# Patient Record
Sex: Female | Born: 1945 | Race: White | Hispanic: No | Marital: Single | Smoking: Former smoker
Health system: Southern US, Community
[De-identification: ages and names within clinical notes are randomized; demographics above are authoritative.]

## PROBLEM LIST (undated history)

## (undated) DIAGNOSIS — Z87442 Personal history of urinary calculi: Secondary | ICD-10-CM

## (undated) DIAGNOSIS — I10 Essential (primary) hypertension: Secondary | ICD-10-CM

## (undated) DIAGNOSIS — M1990 Unspecified osteoarthritis, unspecified site: Secondary | ICD-10-CM

## (undated) DIAGNOSIS — E785 Hyperlipidemia, unspecified: Secondary | ICD-10-CM

## (undated) DIAGNOSIS — E039 Hypothyroidism, unspecified: Secondary | ICD-10-CM

## (undated) HISTORY — DX: Hyperlipidemia, unspecified: E78.5

## (undated) HISTORY — DX: Hypothyroidism, unspecified: E03.9

## (undated) HISTORY — DX: Essential (primary) hypertension: I10

## (undated) HISTORY — PX: OTHER SURGICAL HISTORY: SHX169

## (undated) HISTORY — PX: WISDOM TOOTH EXTRACTION: SHX21

---

## 1999-07-23 ENCOUNTER — Ambulatory Visit (HOSPITAL_COMMUNITY): Admission: RE | Admit: 1999-07-23 | Discharge: 1999-07-23 | Payer: Self-pay | Admitting: Family Medicine

## 1999-07-23 ENCOUNTER — Encounter: Payer: Self-pay | Admitting: Family Medicine

## 2001-08-22 ENCOUNTER — Other Ambulatory Visit: Admission: RE | Admit: 2001-08-22 | Discharge: 2001-08-22 | Payer: Self-pay | Admitting: Family Medicine

## 2002-09-10 ENCOUNTER — Other Ambulatory Visit: Admission: RE | Admit: 2002-09-10 | Discharge: 2002-09-10 | Payer: Self-pay | Admitting: Family Medicine

## 2003-08-25 ENCOUNTER — Ambulatory Visit (HOSPITAL_COMMUNITY): Admission: RE | Admit: 2003-08-25 | Discharge: 2003-08-25 | Payer: Self-pay | Admitting: Family Medicine

## 2003-08-25 ENCOUNTER — Encounter: Payer: Self-pay | Admitting: Family Medicine

## 2003-09-15 ENCOUNTER — Other Ambulatory Visit: Admission: RE | Admit: 2003-09-15 | Discharge: 2003-09-15 | Payer: Self-pay | Admitting: Family Medicine

## 2003-09-29 ENCOUNTER — Ambulatory Visit (HOSPITAL_COMMUNITY): Admission: RE | Admit: 2003-09-29 | Discharge: 2003-09-29 | Payer: Self-pay | Admitting: Family Medicine

## 2005-01-20 ENCOUNTER — Other Ambulatory Visit: Admission: RE | Admit: 2005-01-20 | Discharge: 2005-01-20 | Payer: Self-pay | Admitting: Family Medicine

## 2005-11-28 ENCOUNTER — Encounter: Admission: RE | Admit: 2005-11-28 | Discharge: 2005-11-28 | Payer: Self-pay | Admitting: Family Medicine

## 2005-11-28 IMAGING — US US SOFT TISSUE HEAD/NECK
1 series · 14 of 25 positions shown · non-contrast
Comparison: none

CLINICAL DATA: Hypothyroidism, dysphagia.
 THYROID ULTRASOUND:
TECHNIQUE: Ultrasound examination of the thyroid gland and adjacent soft tissue structures was performed.
 The right lobe of the thyroid measures 4.7 x 1.8 x 1.8 cm in size and the left lobe 4.4 x 1.4 x 1.5 cm in size.  The thyroid echotexture is somewhat inhomogeneous.  There is a 7 x 3 x 4 mm in size small solid nodule within the anterior superior portion of the right isthmus.  The remainder of the study is normal.

[Series 1: unknown · 0.09mm/px · 14 of 29 slices shown]
[im 1/29]
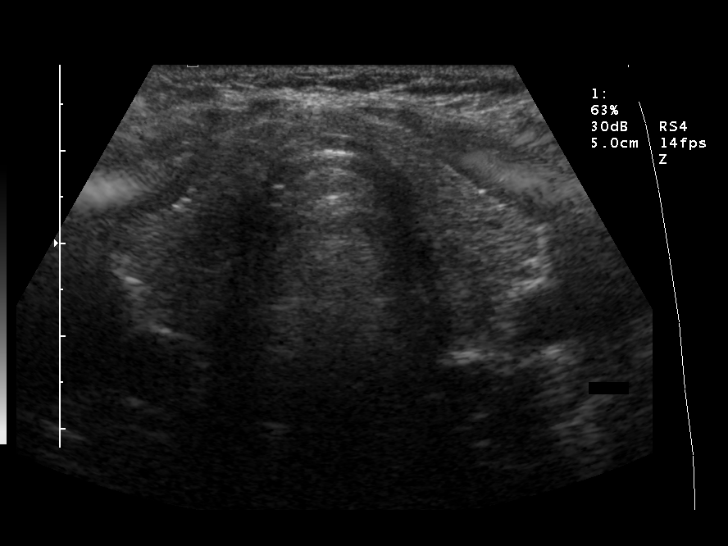
[im 3/29]
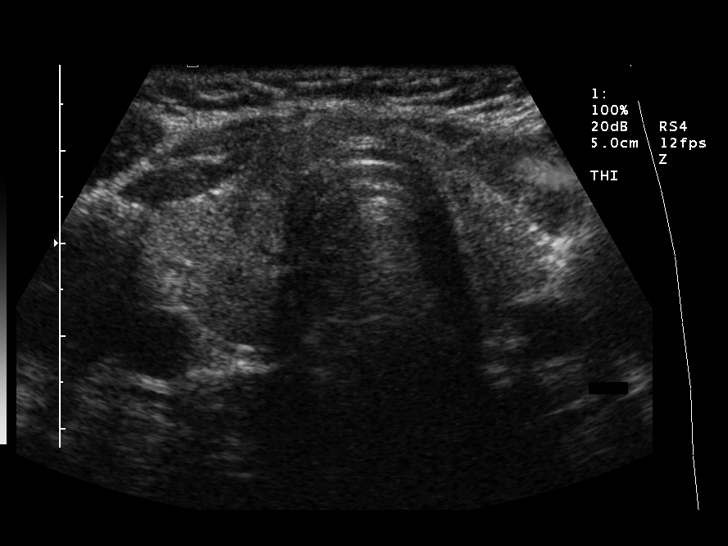
[im 5/29]
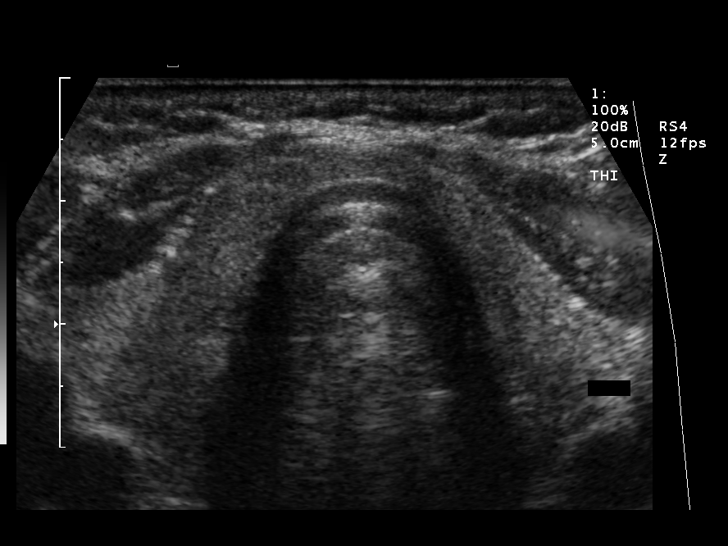
[im 8/29]
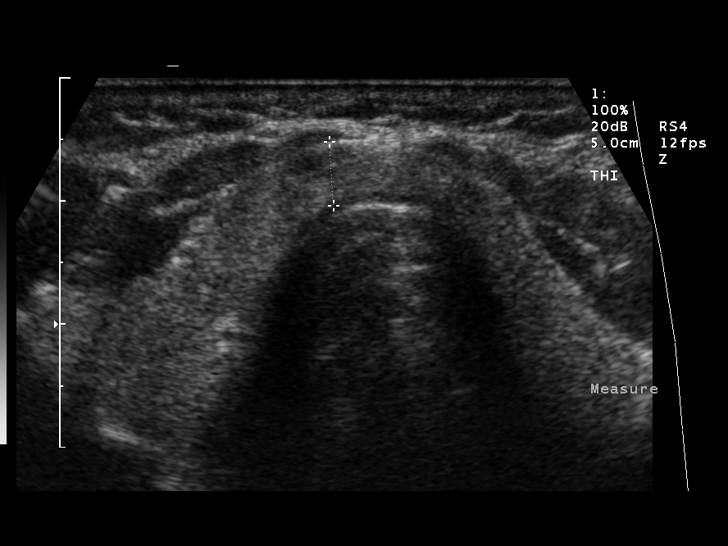
[im 10/29]
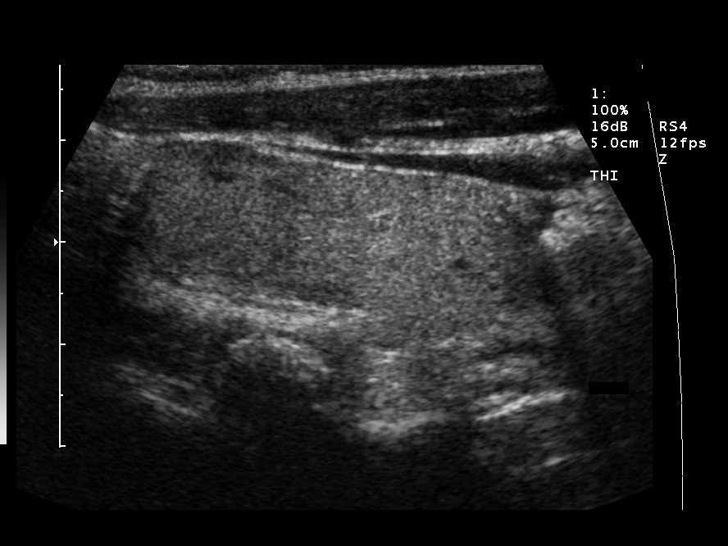
[im 11/29]
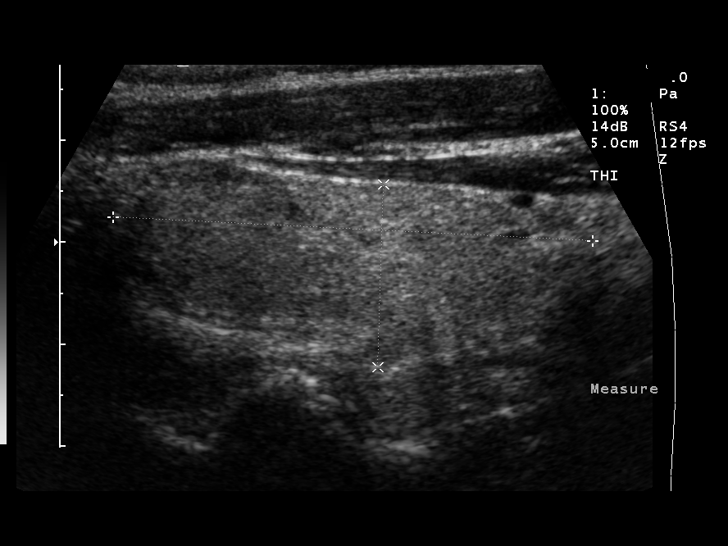
[im 13/29]
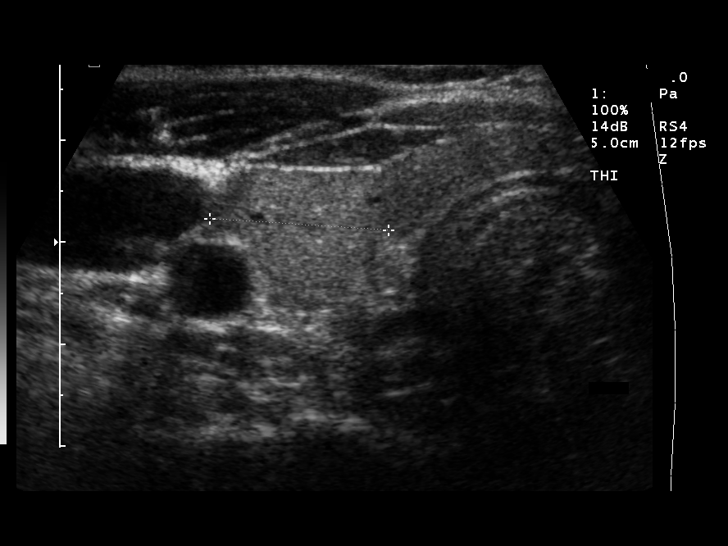
[im 16/29]
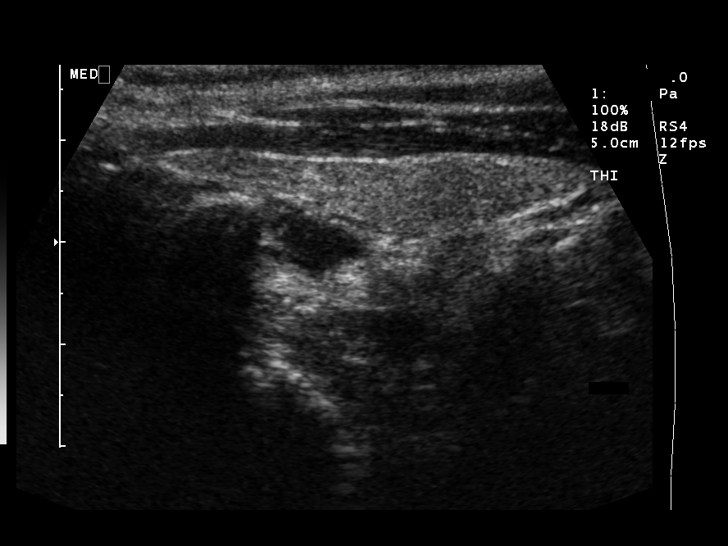
[im 18/29]
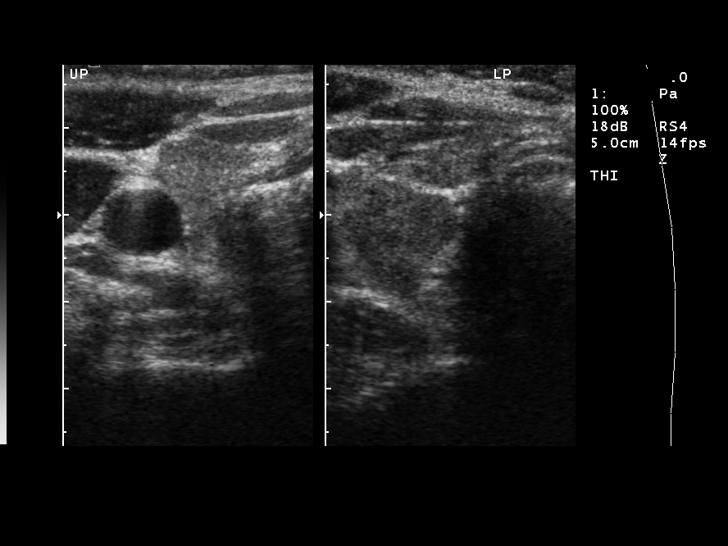
[im 19/29]
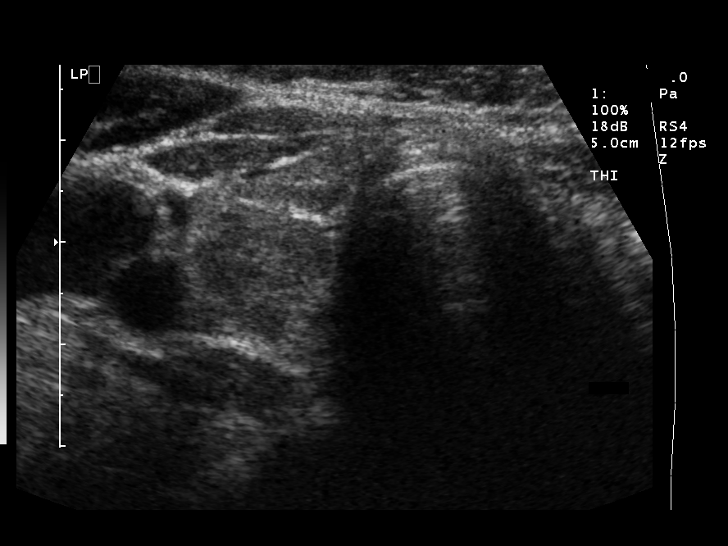
[im 22/29]
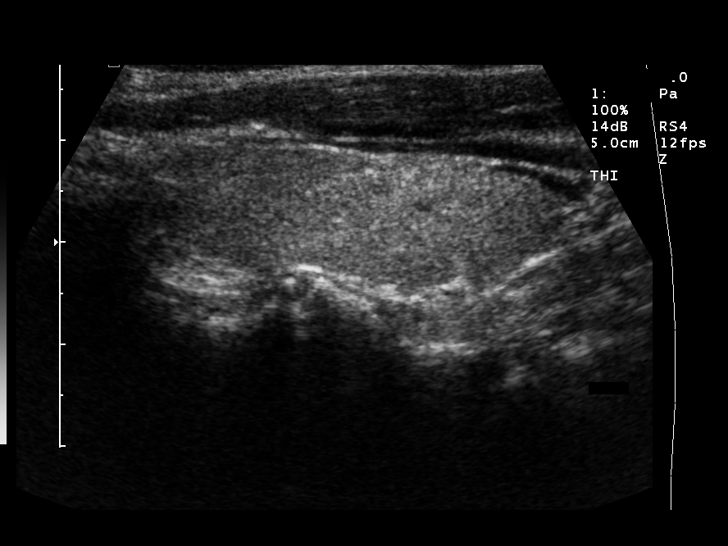
[im 24/29]
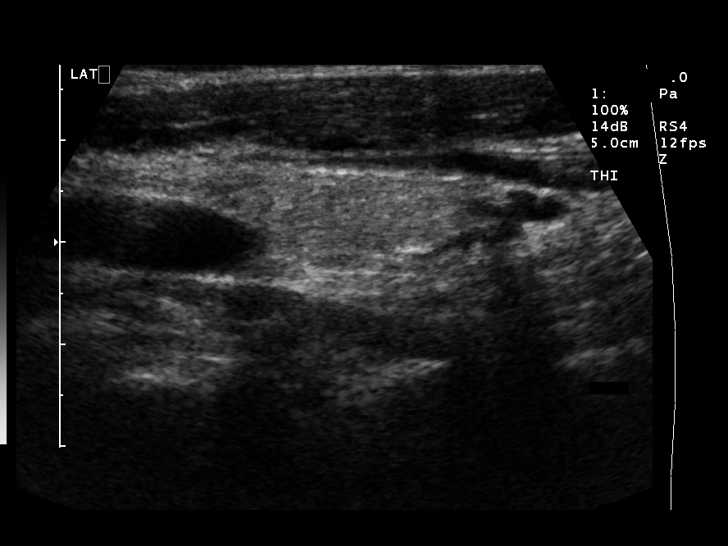
[im 26/29]
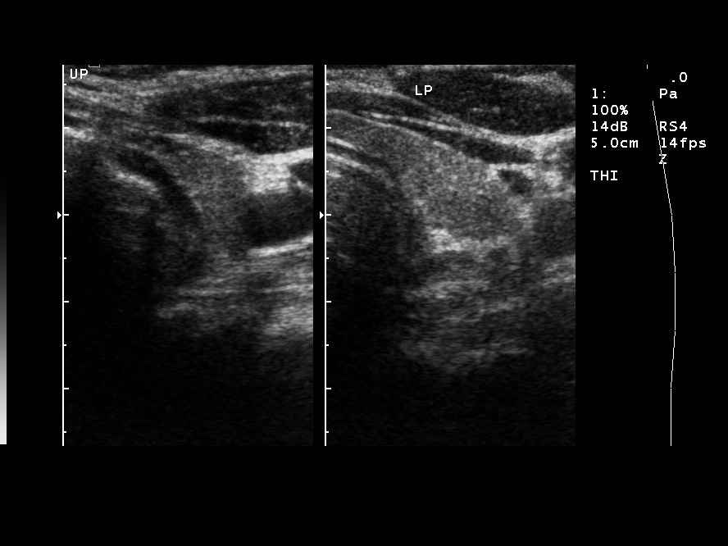
[im 29/29]
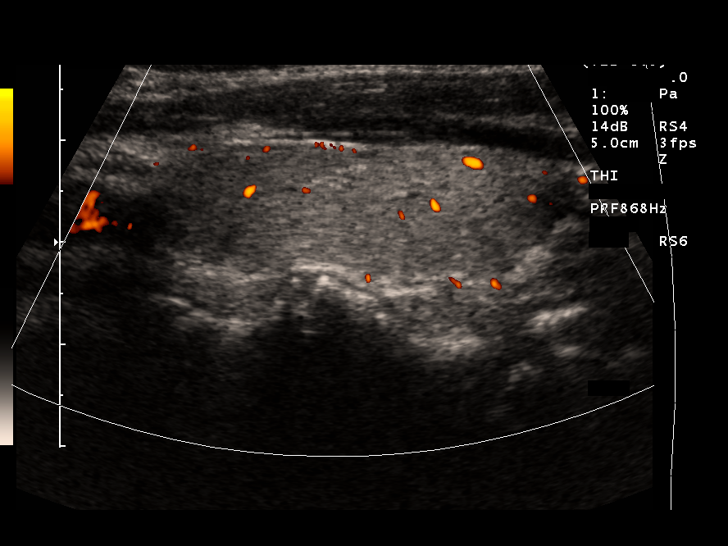

[14 of 25 positions shown; findings below may reference images not displayed]

IMPRESSION: Small solid nodule located within the anterior superior isthmus to the right of midline measuring 7 x 3 x 4 mm in size.

## 2006-10-27 ENCOUNTER — Encounter: Admission: RE | Admit: 2006-10-27 | Discharge: 2006-10-27 | Payer: Self-pay | Admitting: Family Medicine

## 2006-10-27 IMAGING — US US ABDOMEN COMPLETE
1 series · 14 of 25 positions shown · non-contrast
Comparison: [REDACTED] nuclear medicine hepatobiliary with gallbladder ejection fraction study [DATE].

CLINICAL DATA: Elevated liver function tests.  Abdominal bloating.  
 COMPLETE ABDOMINAL ULTRASOUND:
TECHNIQUE: Complete abdominal ultrasound examination was performed including evaluation of the liver, gallbladder, bile ducts, pancreas, kidneys, spleen, IVC, and abdominal aorta.

[Series 1: unknown · 14 of 70 slices shown]
[im 1/70]
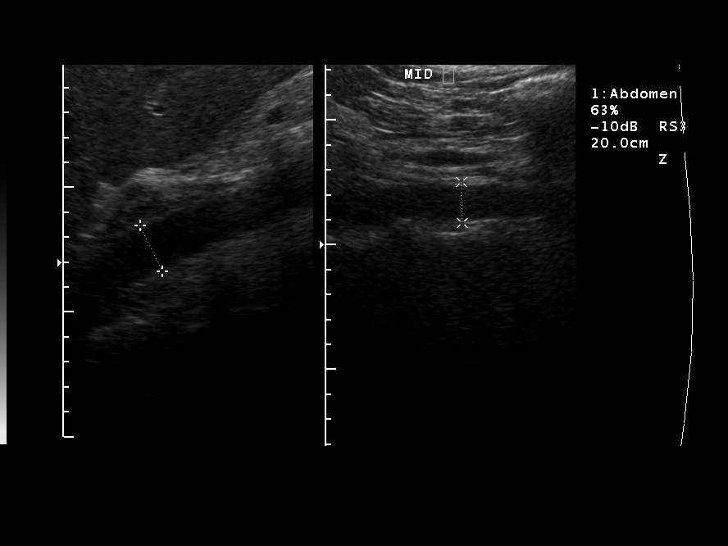
[im 6/70]
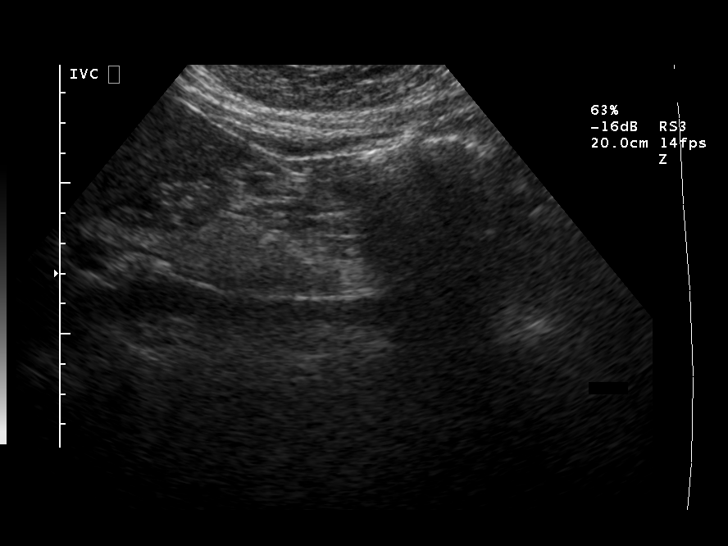
[im 12/70]
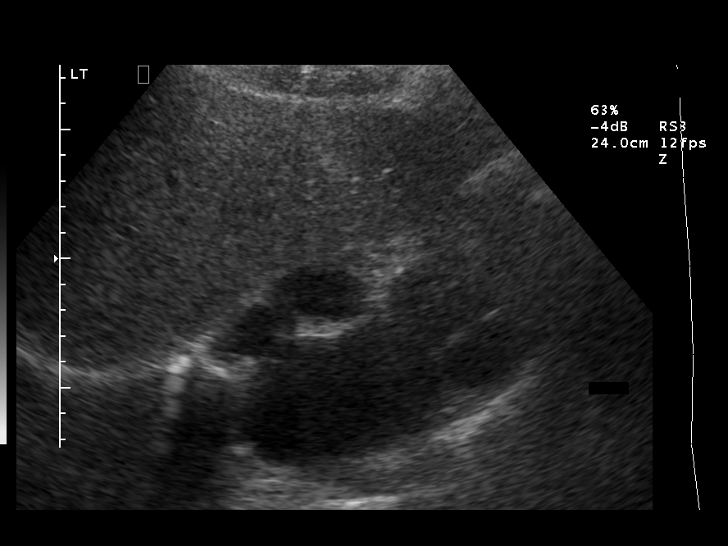
[im 18/70]
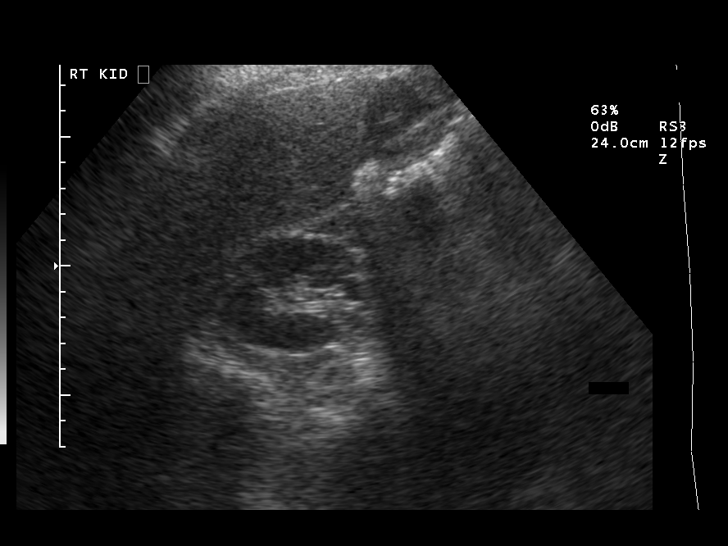
[im 24/70]
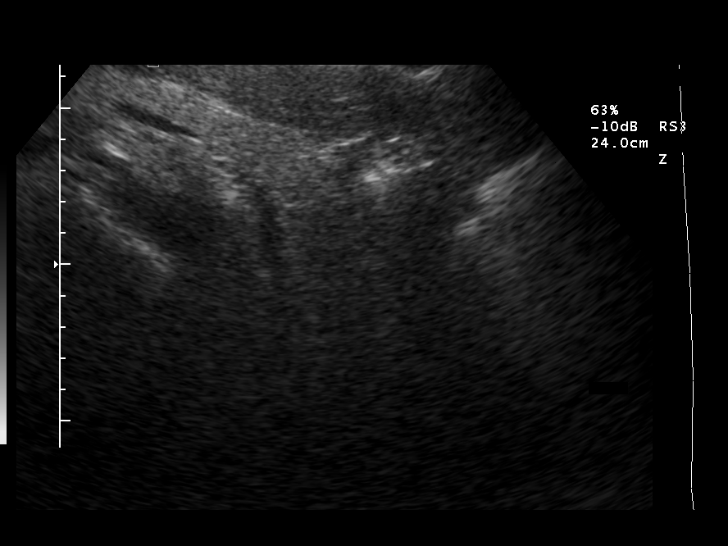
[im 26/70]
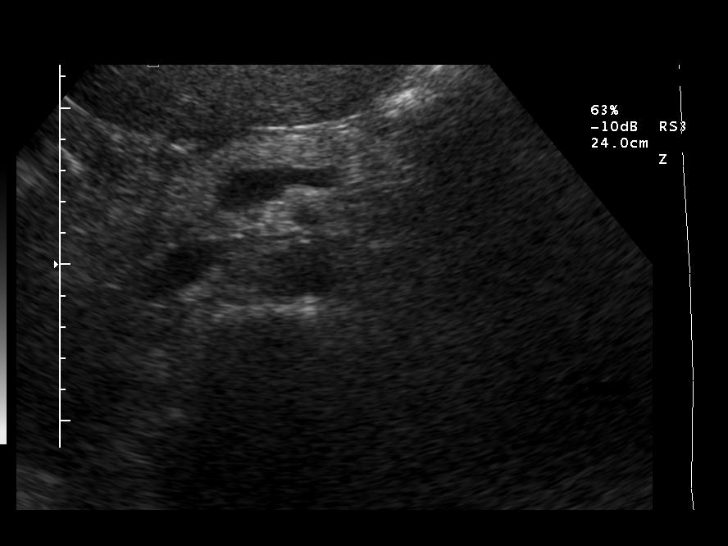
[im 32/70]
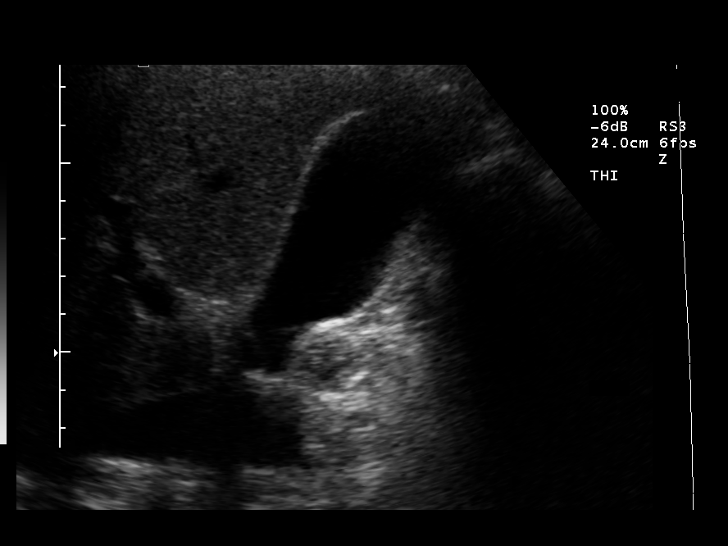
[im 38/70]
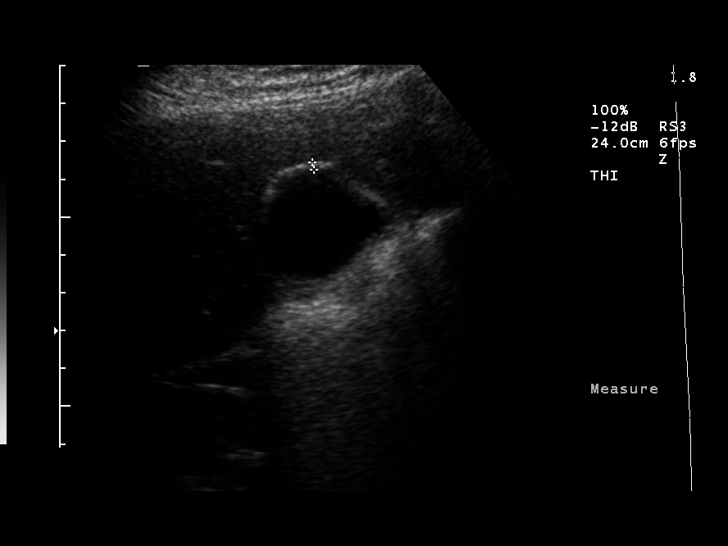
[im 44/70]
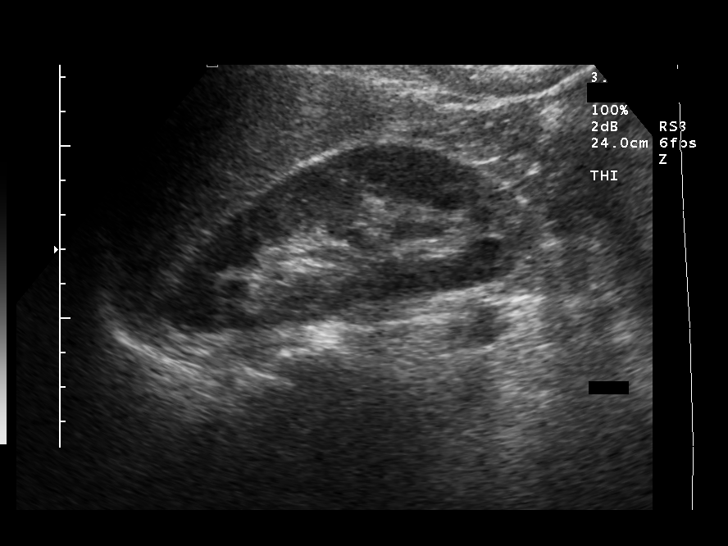
[im 47/70]
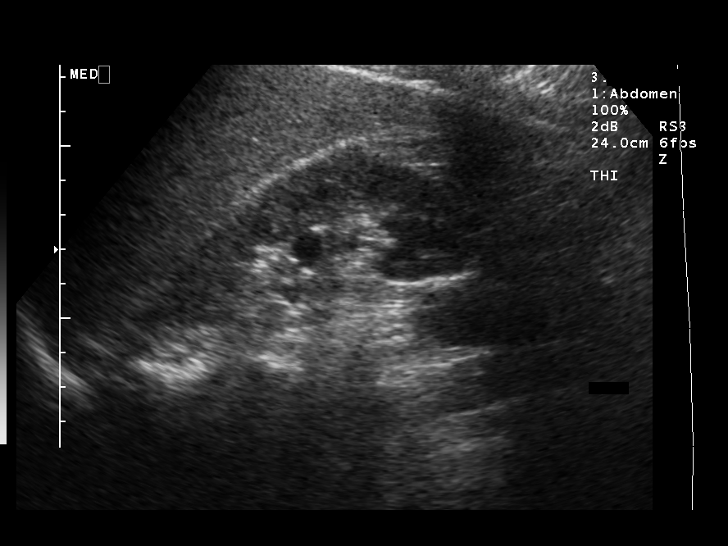
[im 52/70]
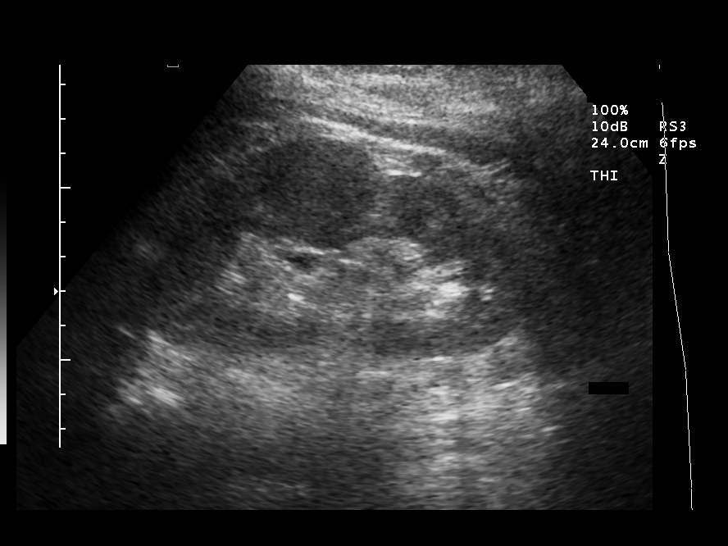
[im 58/70]
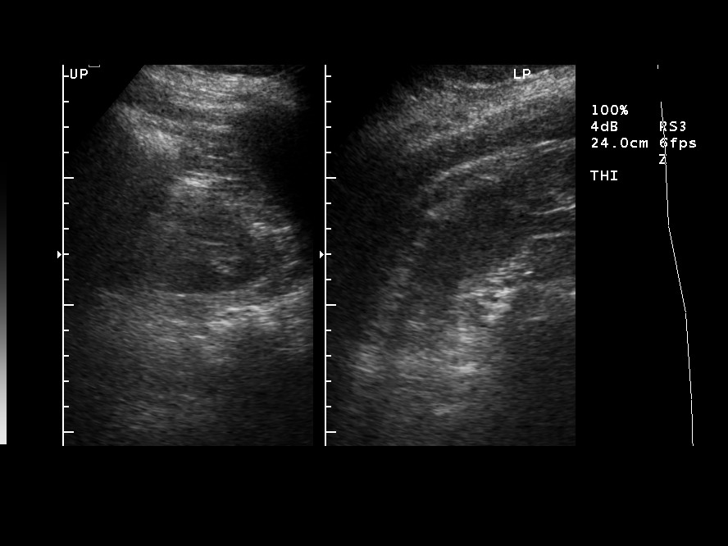
[im 64/70]
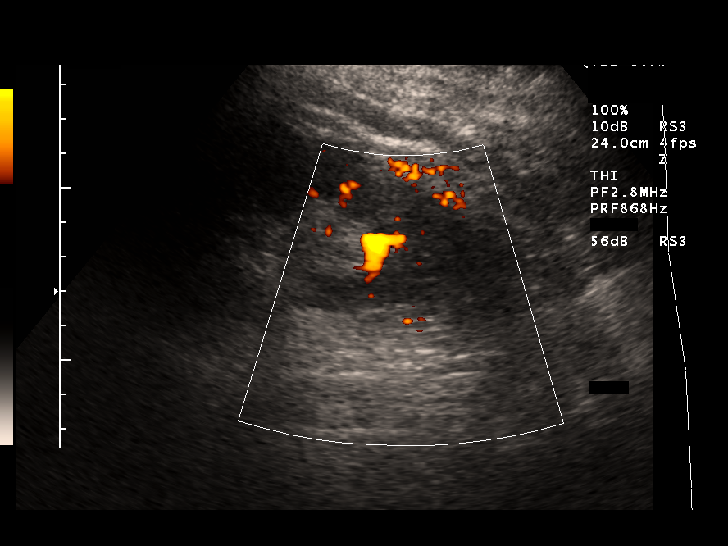
[im 70/70]
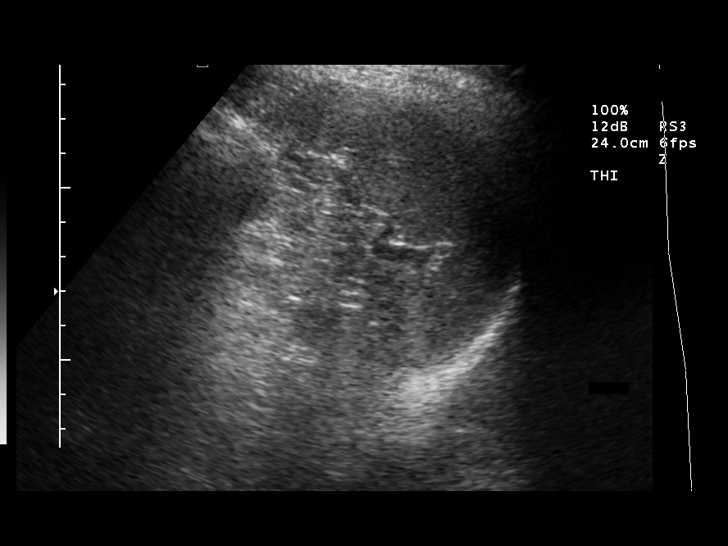

[14 of 25 positions shown; findings below may reference images not displayed]

FINDINGS: Gallbladder is sonographically normal without sludge nor gallstones with normal wall thickness of 2mm.  No dilated intrahepatic nor extrahepatic bile ducts are seen with the common bile duct diameter measuring normally at 4mm.  Liver, inferior vena cava, spleen (9cm long), right kidney (12cm long), and abdominal aorta (maximum proximal diameter 2cm) appear sonographically normal.  Pancreatic tail is limited for assessment due to overlying gastrointestinal gas with the pancreas otherwise unremarkable.  At the lower pole left kidney is 7mm non obstructing calculus.  Left kidney measures 12.3cm long with no hydronephrosis.
IMPRESSION: 1.  Limited assessment of pancreatic tail. 
 2.  Non obstructing 7mm lower pole left renal calculus. 
 3.  Otherwise normal.

## 2008-05-30 ENCOUNTER — Encounter: Admission: RE | Admit: 2008-05-30 | Discharge: 2008-05-30 | Payer: Self-pay | Admitting: Family Medicine

## 2008-05-30 IMAGING — US US ABDOMEN COMPLETE
1 series · 14 of 25 positions shown · non-contrast
Comparison: [DATE]

CLINICAL DATA: Right upper quadrant abdominal pain.

ABDOMEN ULTRASOUND
TECHNIQUE: Complete abdominal ultrasound examination was performed
including evaluation of the liver, gallbladder, bile ducts,
pancreas, kidneys, spleen, IVC, and abdominal aorta.

[Series 1: us abdomen complete · 0.39mm/px · 14 of 79 slices shown]
[im 1/79]
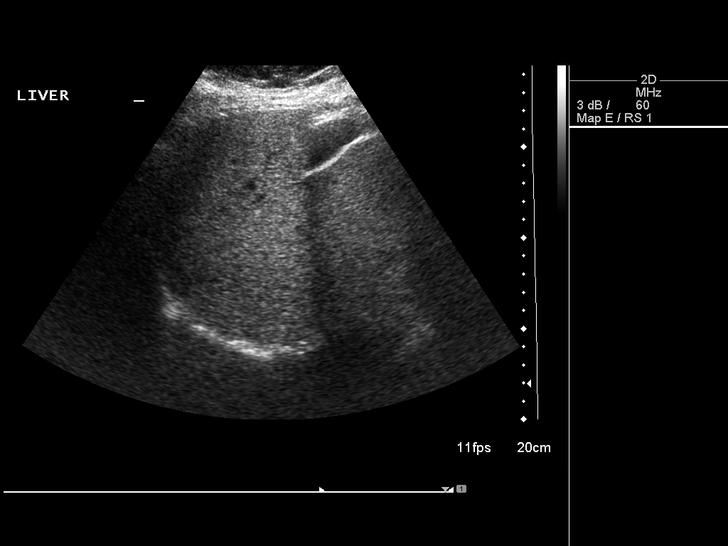
[im 7/79]
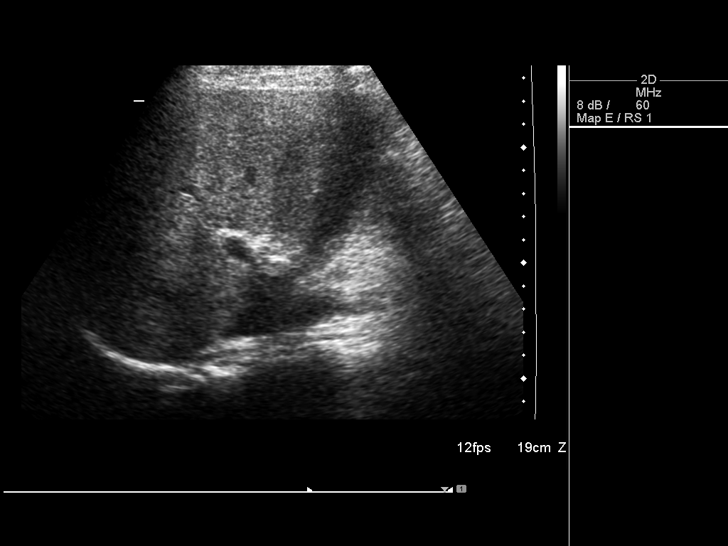
[im 14/79]
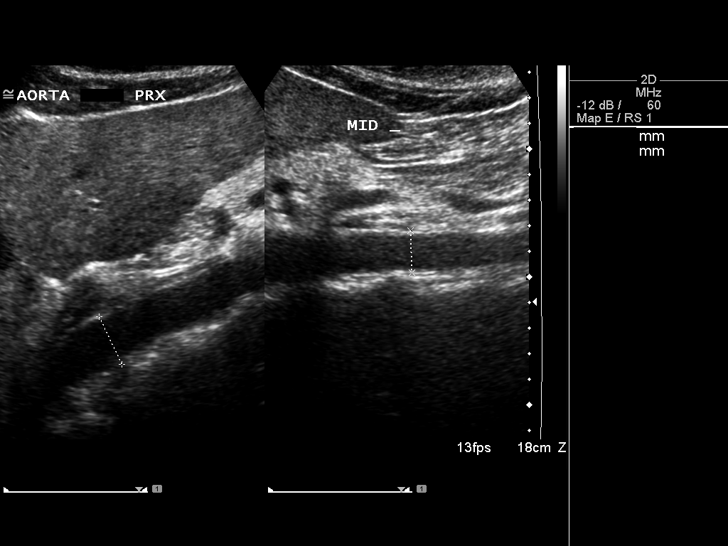
[im 20/79]
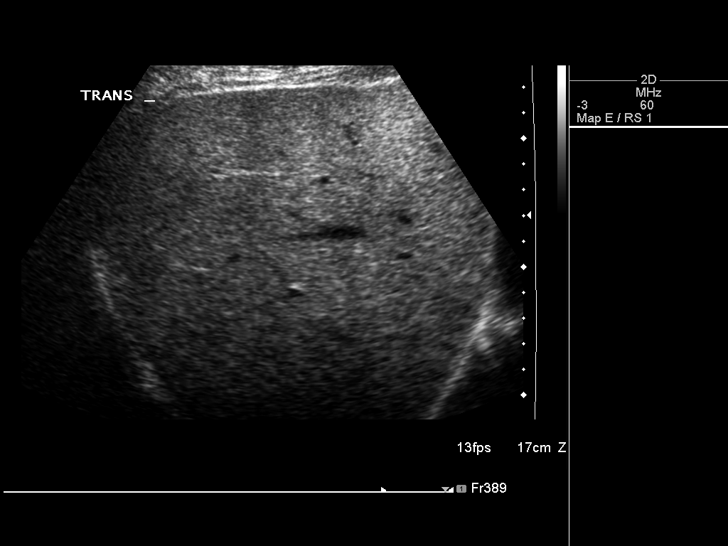
[im 27/79]
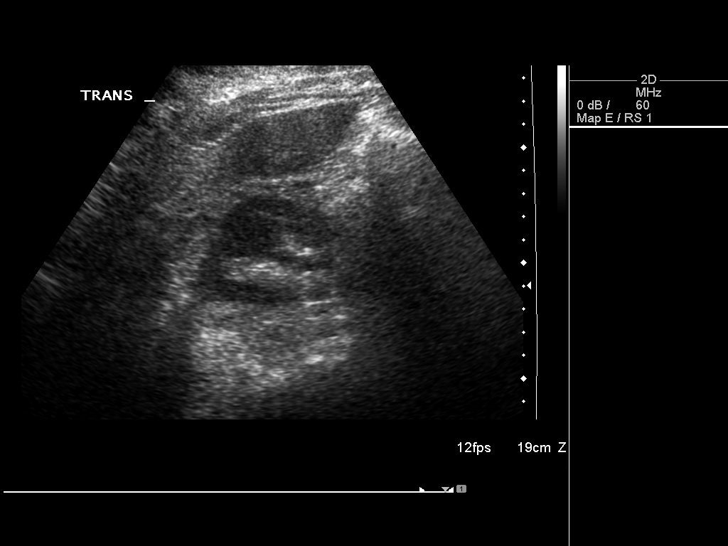
[im 30/79]
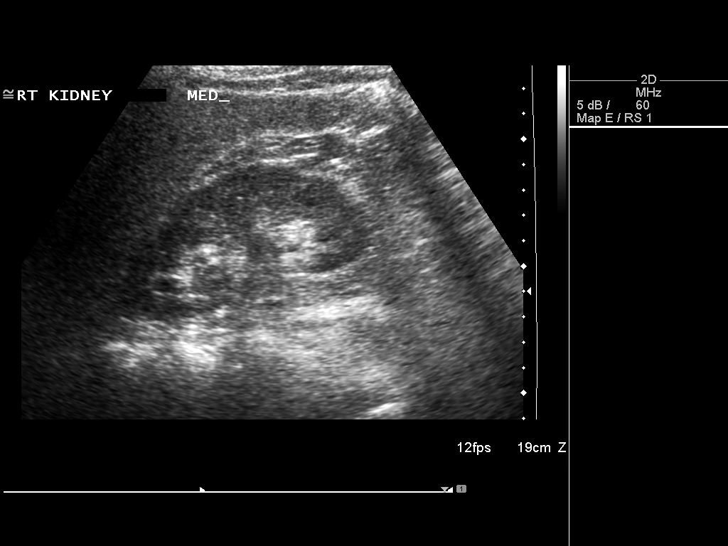
[im 36/79]
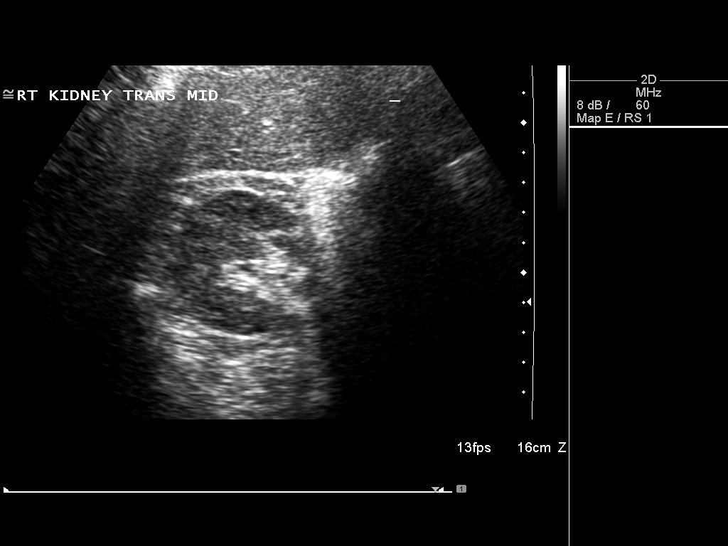
[im 43/79]
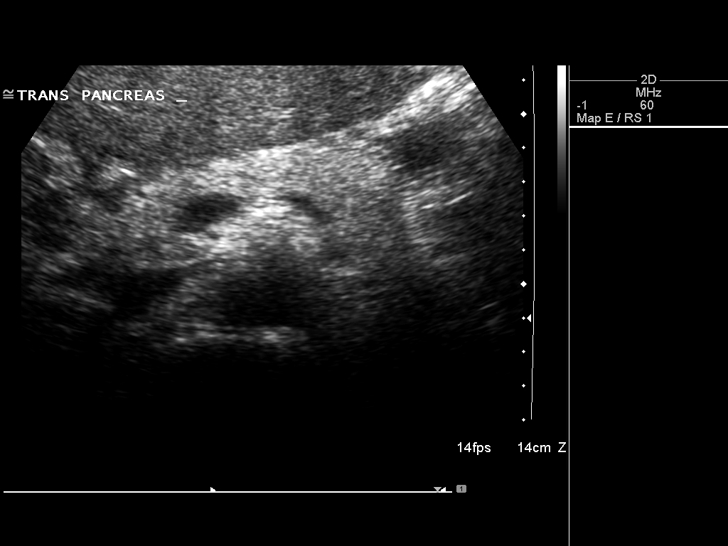
[im 49/79]
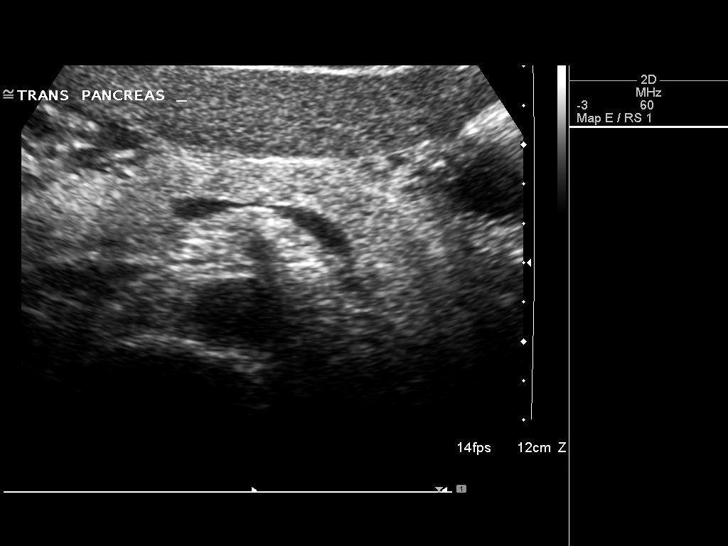
[im 53/79]
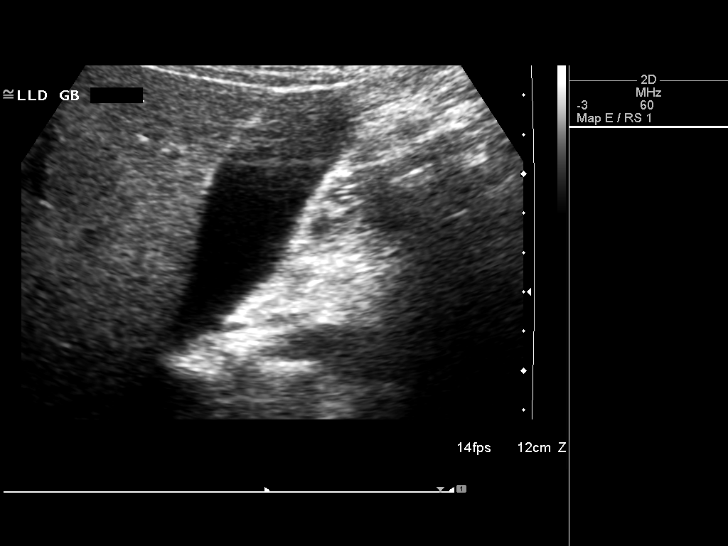
[im 59/79]
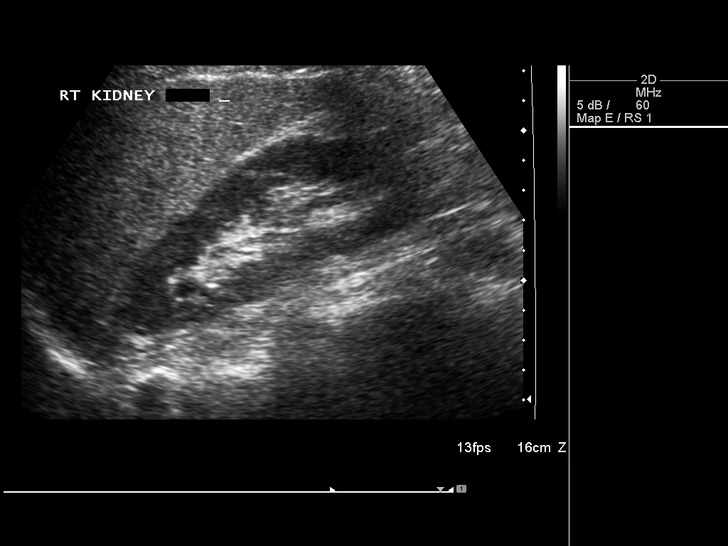
[im 66/79]
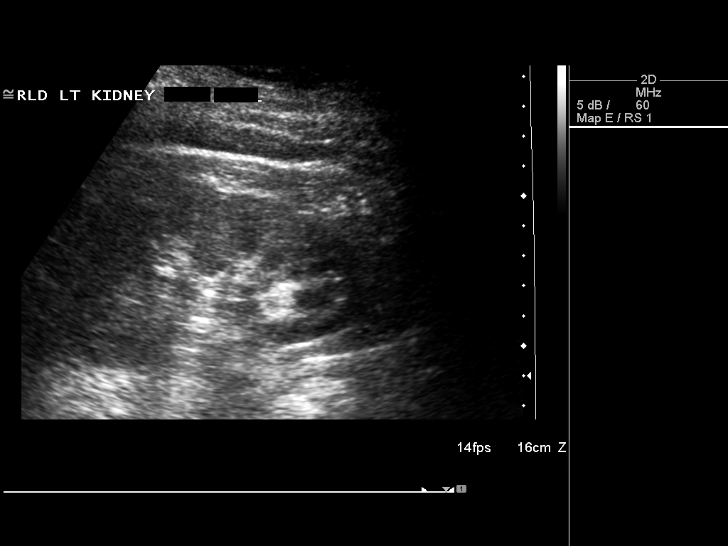
[im 72/79]
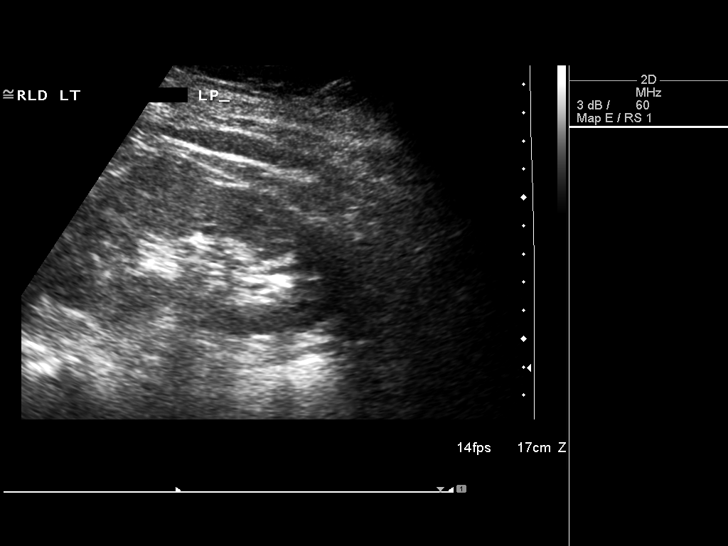
[im 79/79]
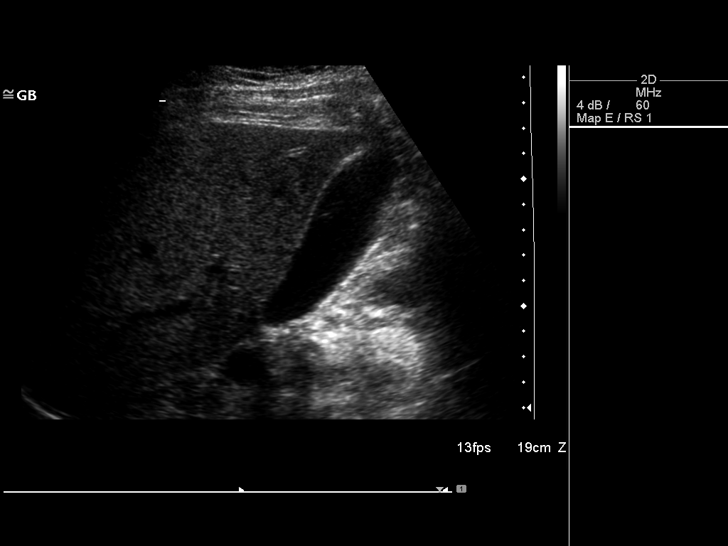

[14 of 25 positions shown; findings below may reference images not displayed]

FINDINGS: The liver is sonographically unremarkable.  No focal
lesions or intrahepatic biliary dilatation.  The common bile duct
is normal in caliber measuring 3.9 mm.  The gallbladder
sonographically normal.

The pancreas is fairly well visualized and demonstrates no
sonographic abnormalities.  The IVC and aorta are normal in
caliber.

The spleen is normal in size and demonstrates normal echogenicity
without focal lesions.  The right kidney measures 11.0 cm and the
left kidney measures 11.1 cm.  Both kidneys demonstrate normal
renal cortical thickness and echogenicity without hydronephrosis.
There is a 8 mm lower pole left renal calculus which is a stable
finding.
IMPRESSION: 1.  Unremarkable abdominal ultrasound examination except for a
stable nonobstructing lower pole left renal calculus.

## 2010-11-29 ENCOUNTER — Encounter: Payer: Self-pay | Admitting: Family Medicine

## 2011-07-22 ENCOUNTER — Other Ambulatory Visit: Payer: Self-pay | Admitting: Family Medicine

## 2011-07-22 ENCOUNTER — Other Ambulatory Visit (HOSPITAL_COMMUNITY)
Admission: RE | Admit: 2011-07-22 | Discharge: 2011-07-22 | Disposition: A | Discharge status: Home / Self Care | Payer: Self-pay | Source: Ambulatory Visit | Attending: Family Medicine | Admitting: Family Medicine

## 2011-07-22 DIAGNOSIS — Z124 Encounter for screening for malignant neoplasm of cervix: Secondary | ICD-10-CM | POA: Insufficient documentation

## 2012-05-24 ENCOUNTER — Ambulatory Visit
Admission: RE | Admit: 2012-05-24 | Discharge: 2012-05-24 | Disposition: A | Discharge status: Home / Self Care | Payer: Medicare Other | Source: Ambulatory Visit | Attending: Family Medicine | Admitting: Family Medicine

## 2012-05-24 ENCOUNTER — Other Ambulatory Visit: Payer: Self-pay | Admitting: Family Medicine

## 2012-05-24 DIAGNOSIS — R109 Unspecified abdominal pain: Secondary | ICD-10-CM

## 2012-05-24 IMAGING — CT CT ABD-PELV W/O CM
2 of 4 series · 17 of 46 positions shown, 19 images · non-contrast
Comparison: Ultrasound abdomen of [DATE]

CLINICAL DATA: Left-sided flank pain for 5 days

CT ABDOMEN AND PELVIS WITHOUT CONTRAST
TECHNIQUE: Multidetector CT imaging of the abdomen and pelvis was
performed following the standard protocol without intravenous
contrast.

[Series 2: renal stone w/o · axial · non-contrast · 0.83mm/px · z∈[-424,-38]mm · 14 of 85 slices shown, 16 images]
[im 4/85  soft-tissue]
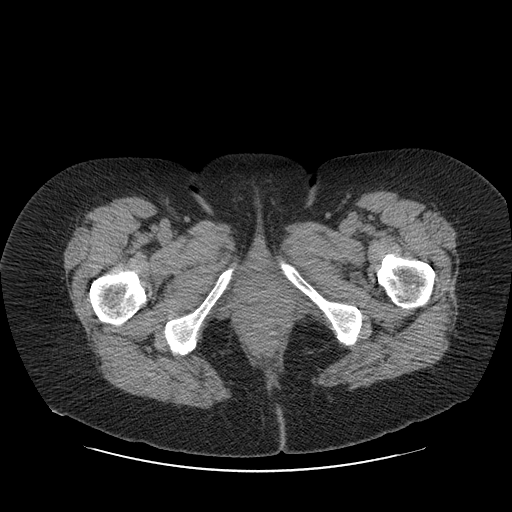
[im 4/85  bone]
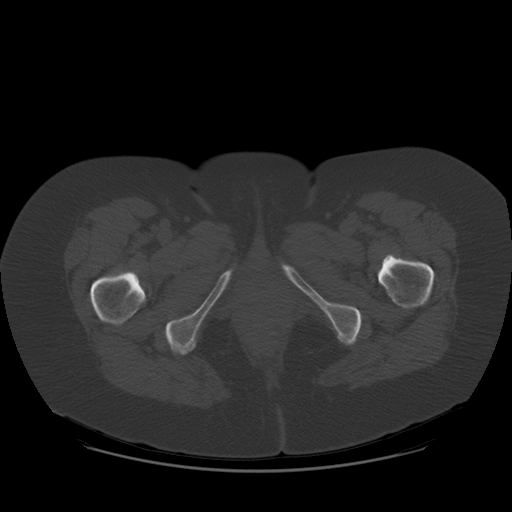
[im 11/85  soft-tissue]
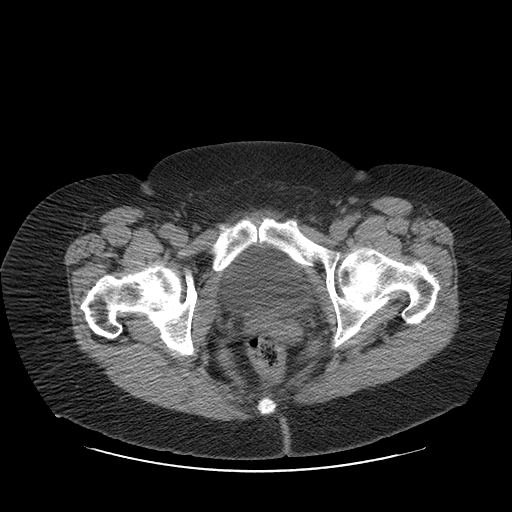
[im 18/85  soft-tissue]
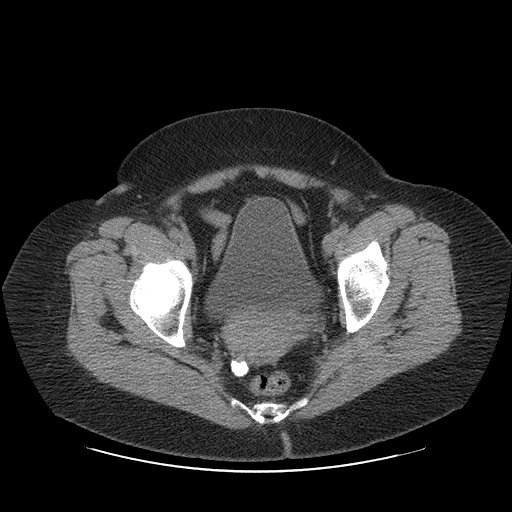
[im 22/85  soft-tissue]
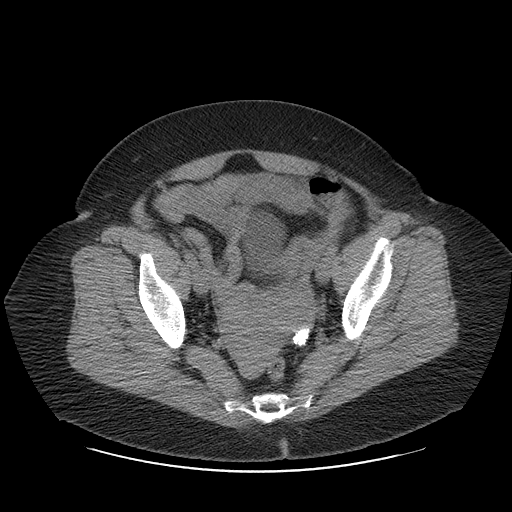
[im 29/85  soft-tissue]
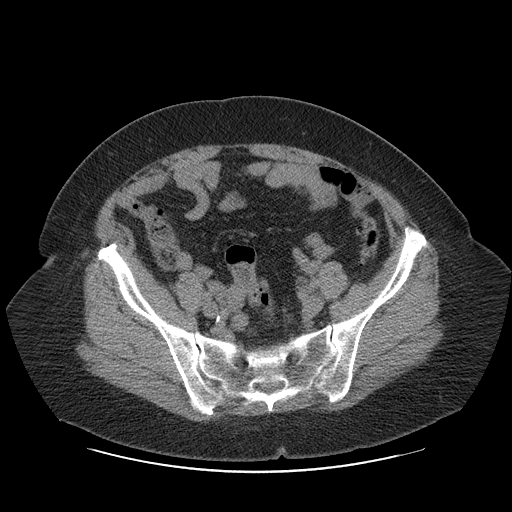
[im 36/85  soft-tissue]
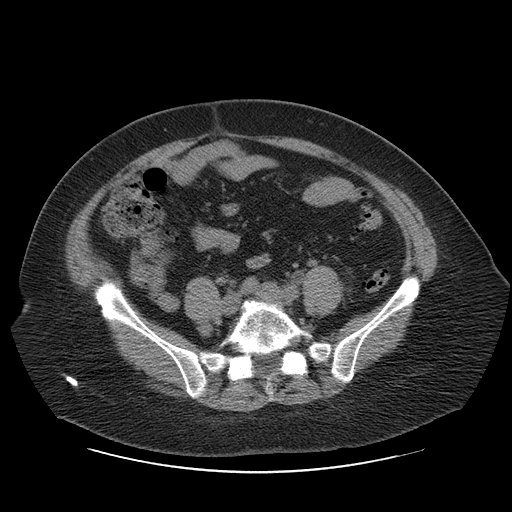
[im 39/85  soft-tissue]
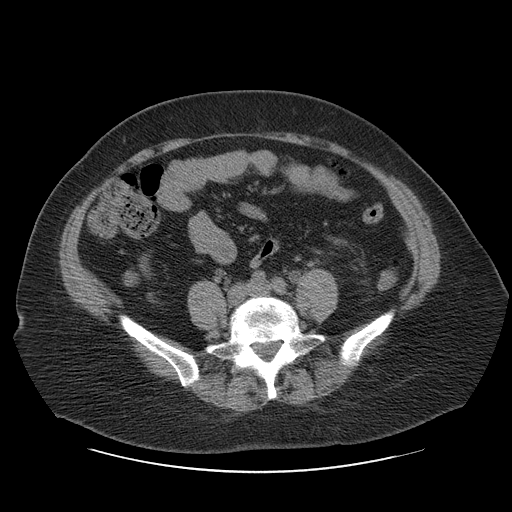
[im 46/85  soft-tissue]
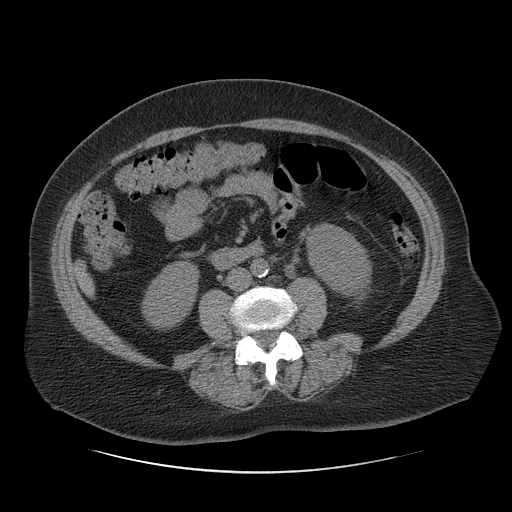
[im 50/85  soft-tissue]
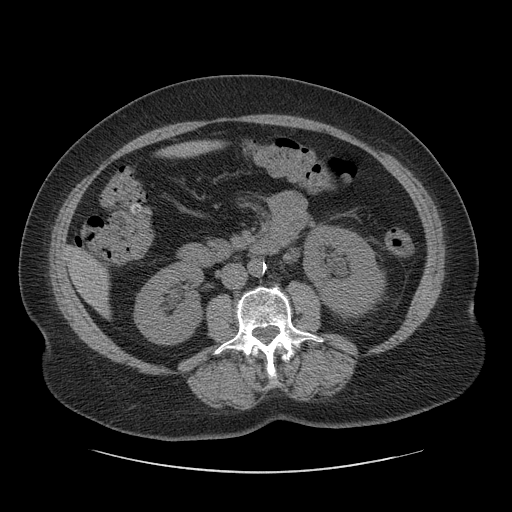
[im 50/85  bone]
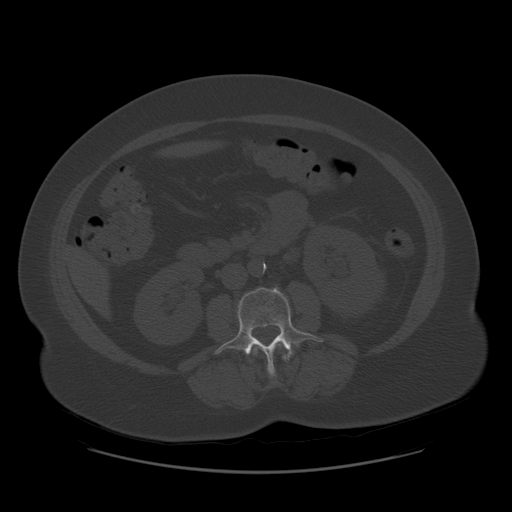
[im 57/85  soft-tissue]
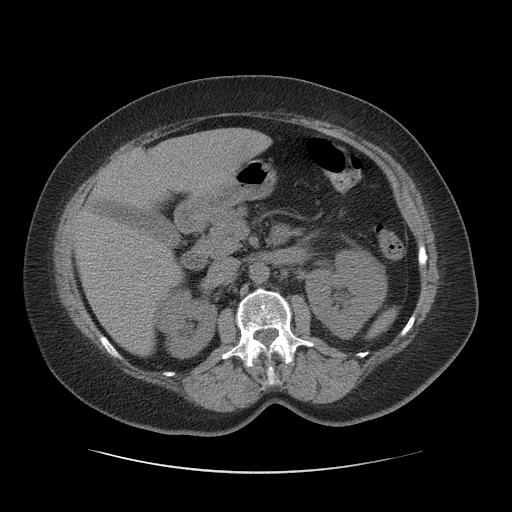
[im 64/85  soft-tissue]
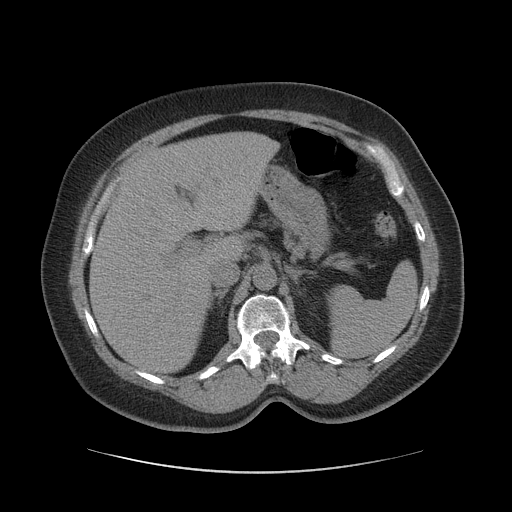
[im 67/85  soft-tissue]
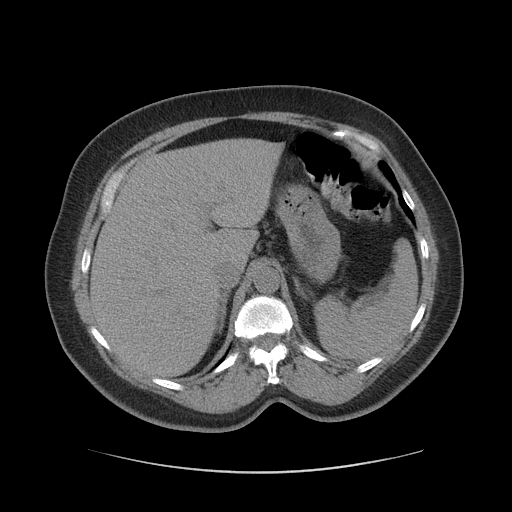
[im 74/85  soft-tissue]
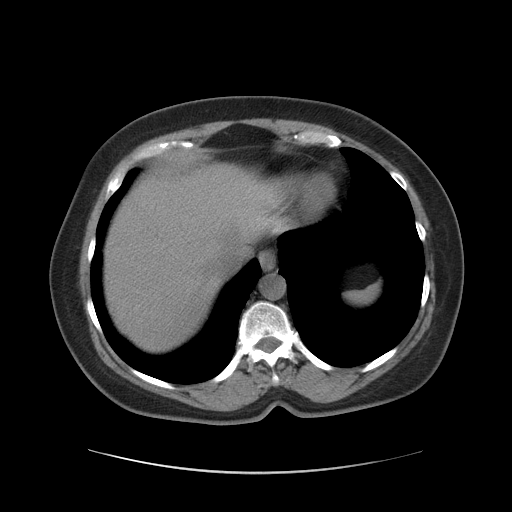
[im 81/85  soft-tissue]
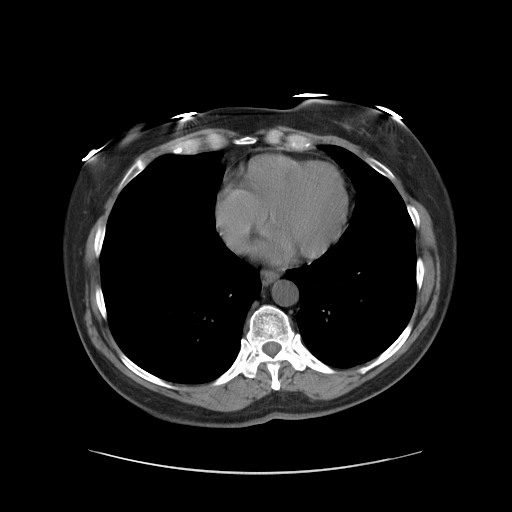

[Series 400: cor · coronal · 0.85mm/px · 3 of 137 slices shown]
[im 46/137  soft-tissue]
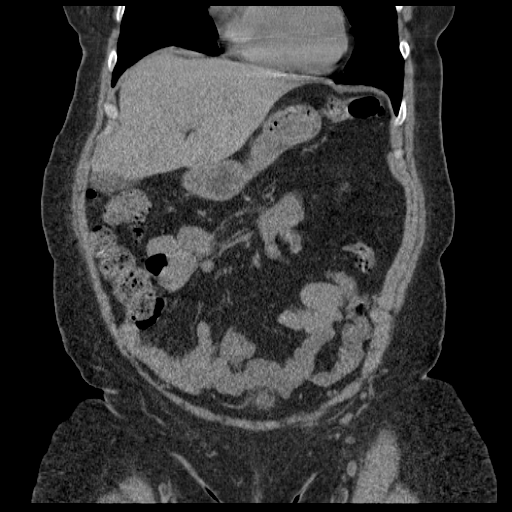
[im 61/137  soft-tissue]
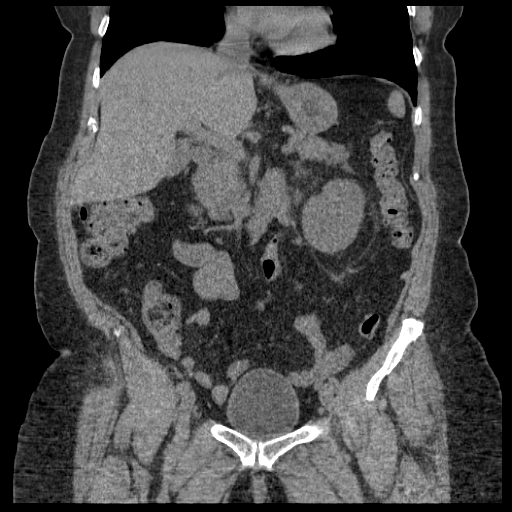
[im 76/137  soft-tissue]
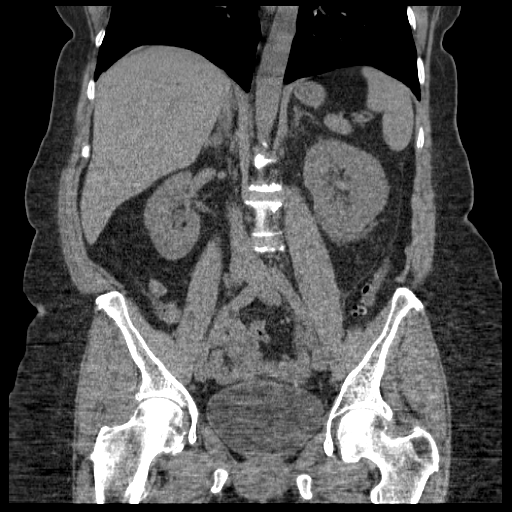

[17 of 46 positions shown; findings below may reference images not displayed]

FINDINGS: The lung bases are clear.  The liver is unremarkable in
the unenhanced state with a probable tiny cyst in the upper right
lobe near the dome.  No calcified gallstones are seen.  The
pancreas is normal in size and the pancreatic duct is not dilated.
The adrenal glands and spleen are unremarkable.  The stomach is not
well distended.  No renal calculi are seen.  However, there is
moderate left hydronephrosis present with hydroureter to a point of
obstruction by a 7 mm distal left ureteral calculus near the left
UV junction.  The right ureter is normal in caliber.  The abdominal
aorta appears normal.

The uterus is normal in size.  Dense calcification posteriorly in
the left uterus is consistent with a calcified uterine fibroid.
The urinary bladder is unremarkable.  No fluid is seen within the
pelvis.  There are a few scattered colonic diverticula present.
The appendix is well visualized in the right abdomen and is
unremarkable.  The terminal ileum appears normal.  Degenerative
disc disease is present at the L5 S1 level.
IMPRESSION: 1.  Moderate left hydronephrosis and hydroureter to a point of
obstruction by a 7 mm distal left ureteral calculus very near the
left UV junction.
2.  No other renal calculi are seen.
3.  The appendix and terminal ileum appear normal.
4.  Calcified left uterine fibroid.

## 2015-06-25 ENCOUNTER — Other Ambulatory Visit (HOSPITAL_COMMUNITY)
Admission: RE | Admit: 2015-06-25 | Discharge: 2015-06-25 | Disposition: A | Discharge status: Home / Self Care | Payer: 59 | Source: Ambulatory Visit | Attending: Family Medicine | Admitting: Family Medicine

## 2015-06-25 ENCOUNTER — Other Ambulatory Visit: Payer: Self-pay | Admitting: Family Medicine

## 2015-06-25 DIAGNOSIS — Z124 Encounter for screening for malignant neoplasm of cervix: Secondary | ICD-10-CM | POA: Insufficient documentation

## 2015-06-29 LAB — CYTOLOGY - PAP

## 2016-09-05 ENCOUNTER — Telehealth: Payer: Self-pay | Admitting: Cardiology

## 2016-09-05 NOTE — Telephone Encounter (Signed)
Received records from [facility] for appointment on 09/16/16 with Dr Jens Somrenshaw.  [facility] for Dr Ludwig Clarksrenshaw's schedule on 09/16/16. lp

## 2016-09-13 NOTE — Progress Notes (Signed)
HPI: 70 yo female for evaluation of chest pain. Seen recently by primary care with complaints of left upper quadrant/left lower chest pain. Hemoglobin and liver functions normal. LDL 200. Hemoglobin 14.3. Patient states she has a long history of indigestion. She develops this after eating. It has become more severe recently. She develops left upper quadrant discomfort that radiates up to her left chest. It is relieved with Alka-Seltzer. No associated symptoms. She does not have dyspnea on exertion, orthopnea, PND, pedal edema, exertional chest pain or syncope. She denies dysphagia, melena or hematochezia.  Current Outpatient Prescriptions  Medication Sig Dispense Refill  . aspirin 81 MG tablet Take 81 mg by mouth daily.    . cholecalciferol (VITAMIN D) 1000 units tablet Take 1,000 Units by mouth 2 (two) times daily.    . IBUPROFEN PO Take 200 mg by mouth as needed.    Marland Kitchen. losartan (COZAAR) 50 MG tablet Take 50 mg by mouth daily.    . Probiotic Product (PROBIOTIC DAILY PO) Take 1 tablet by mouth daily.    . simvastatin (ZOCOR) 20 MG tablet Take 20 mg by mouth daily.     No current facility-administered medications for this visit.     Allergies  Allergen Reactions  . Codeine Nausea Only  . Penicillin G Rash    Other reaction(s): rash     Past Medical History:  Diagnosis Date  . Hyperlipidemia   . Hypertension   . Hypothyroid     Past Surgical History:  Procedure Laterality Date  . No prior surgery      Social History   Social History  . Marital status: Married    Spouse name: N/A  . Number of children: 2  . Years of education: N/A   Occupational History  . Not on file.   Social History Main Topics  . Smoking status: Former Games developermoker  . Smokeless tobacco: Never Used  . Alcohol use No  . Drug use: Unknown  . Sexual activity: Not on file   Other Topics Concern  . Not on file   Social History Narrative  . No narrative on file    Family History  Problem  Relation Age of Onset  . Stroke Mother   . Diabetes Father   . Heart attack Father     ROS: no fevers or chills, productive cough, hemoptysis, dysphasia, odynophagia, melena, hematochezia, dysuria, hematuria, rash, seizure activity, orthopnea, PND, pedal edema, claudication. Remaining systems are negative.  Physical Exam:   Blood pressure (!) 150/78, pulse 80, height 5\' 6"  (1.676 m), weight 198 lb (89.8 kg).  General:  Well developed/well nourished in NAD Skin warm/dry Patient not depressed No peripheral clubbing Back-normal HEENT-normal/normal eyelids Neck supple/normal carotid upstroke bilaterally; no bruits; no JVD; no thyromegaly chest - CTA/ normal expansion CV - RRR/normal S1 and S2; no murmurs, rubs or gallops;  PMI nondisplaced Abdomen -NT/ND, no HSM, no mass, + bowel sounds, no bruit 2+ femoral pulses, no bruits Ext-no edema, chords, 2+ DP Neuro-grossly nonfocal  ECG 08/31/2016-sinus rhythm with no ST changes.  A/P  1 chest pain-symptoms are most consistent with GI etiology. They occur after eating certain foods and are relieved with Alka-Seltzer. She does not have exertional chest pain. Electrocardiogram normal. I do not think she requires further ischemia evaluation. She is scheduled to see a gastroenterologist.  2 hypertension-blood pressure is mildly elevated. I have asked her to track this and if it remains elevated her Cozaar could be increased to 100 mg  daily.  3 hyperlipidemia-recent LDL 200. We discussed importance of diet. Statin recently added by primary care. She will follow-up with them for this issue.   Olga MillersBrian Grettel Rames, MD

## 2016-09-16 ENCOUNTER — Ambulatory Visit (INDEPENDENT_AMBULATORY_CARE_PROVIDER_SITE_OTHER): Payer: Medicare Other | Admitting: Cardiology

## 2016-09-16 ENCOUNTER — Encounter: Payer: Self-pay | Admitting: Cardiology

## 2016-09-16 VITALS — BP 150/78 | HR 80 | Ht 66.0 in | Wt 198.0 lb

## 2016-09-16 DIAGNOSIS — R072 Precordial pain: Secondary | ICD-10-CM | POA: Diagnosis not present

## 2016-09-16 DIAGNOSIS — E7800 Pure hypercholesterolemia, unspecified: Secondary | ICD-10-CM | POA: Diagnosis not present

## 2016-09-16 DIAGNOSIS — I10 Essential (primary) hypertension: Secondary | ICD-10-CM

## 2016-09-16 NOTE — Patient Instructions (Signed)
Your physician recommends that you schedule a follow-up appointment in: AS NEEDED  

## 2017-01-20 DIAGNOSIS — I10 Essential (primary) hypertension: Secondary | ICD-10-CM | POA: Diagnosis present

## 2017-05-17 ENCOUNTER — Other Ambulatory Visit: Payer: Self-pay | Admitting: Physician Assistant

## 2017-05-17 DIAGNOSIS — E2839 Other primary ovarian failure: Secondary | ICD-10-CM

## 2018-10-18 ENCOUNTER — Other Ambulatory Visit: Payer: Self-pay

## 2018-10-18 ENCOUNTER — Emergency Department (INDEPENDENT_AMBULATORY_CARE_PROVIDER_SITE_OTHER)
Admission: EM | Admit: 2018-10-18 | Discharge: 2018-10-18 | Disposition: A | Discharge status: Home / Self Care | Payer: Medicare Other | Source: Home / Self Care | Attending: Emergency Medicine | Admitting: Emergency Medicine

## 2018-10-18 ENCOUNTER — Encounter: Payer: Self-pay | Admitting: Emergency Medicine

## 2018-10-18 ENCOUNTER — Emergency Department (INDEPENDENT_AMBULATORY_CARE_PROVIDER_SITE_OTHER): Payer: Medicare Other

## 2018-10-18 DIAGNOSIS — R0789 Other chest pain: Secondary | ICD-10-CM

## 2018-10-18 DIAGNOSIS — W19XXXA Unspecified fall, initial encounter: Secondary | ICD-10-CM | POA: Diagnosis not present

## 2018-10-18 DIAGNOSIS — S20221A Contusion of right back wall of thorax, initial encounter: Secondary | ICD-10-CM | POA: Diagnosis not present

## 2018-10-18 DIAGNOSIS — R0781 Pleurodynia: Secondary | ICD-10-CM | POA: Diagnosis not present

## 2018-10-18 DIAGNOSIS — S20212A Contusion of left front wall of thorax, initial encounter: Secondary | ICD-10-CM

## 2018-10-18 IMAGING — DX DG THORACIC SPINE 2V
3 series · 3 of 3 positions shown · non-contrast
Comparison: None.

CLINICAL DATA: Left-sided chest pain following fall 1 day ago,
initial encounter

EXAM:
THORACIC SPINE 2 VIEWS

[t-spine ap]
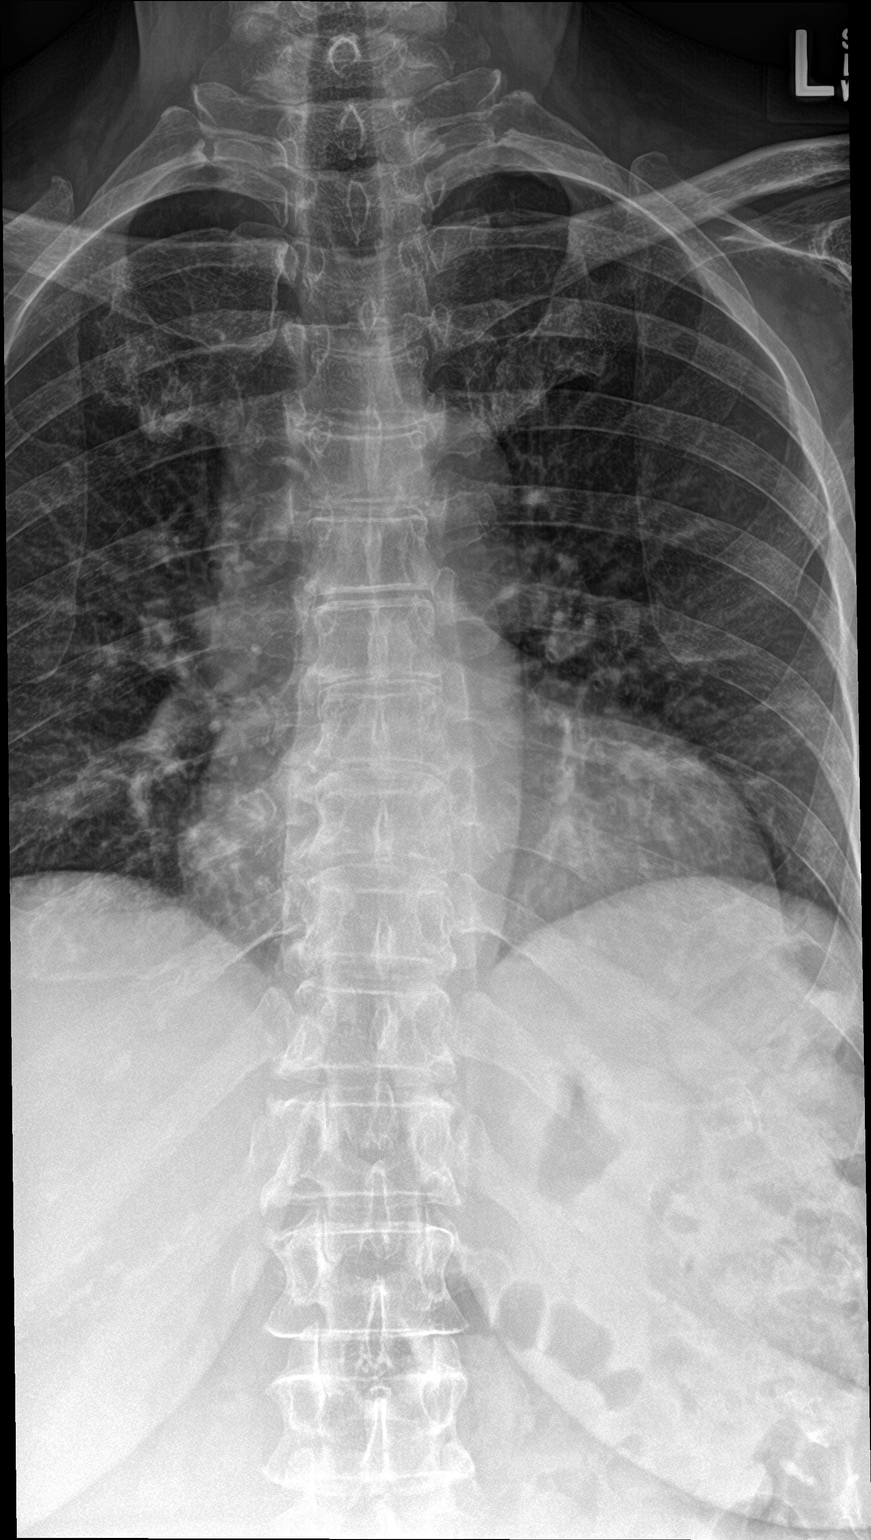

[t-spine lat]
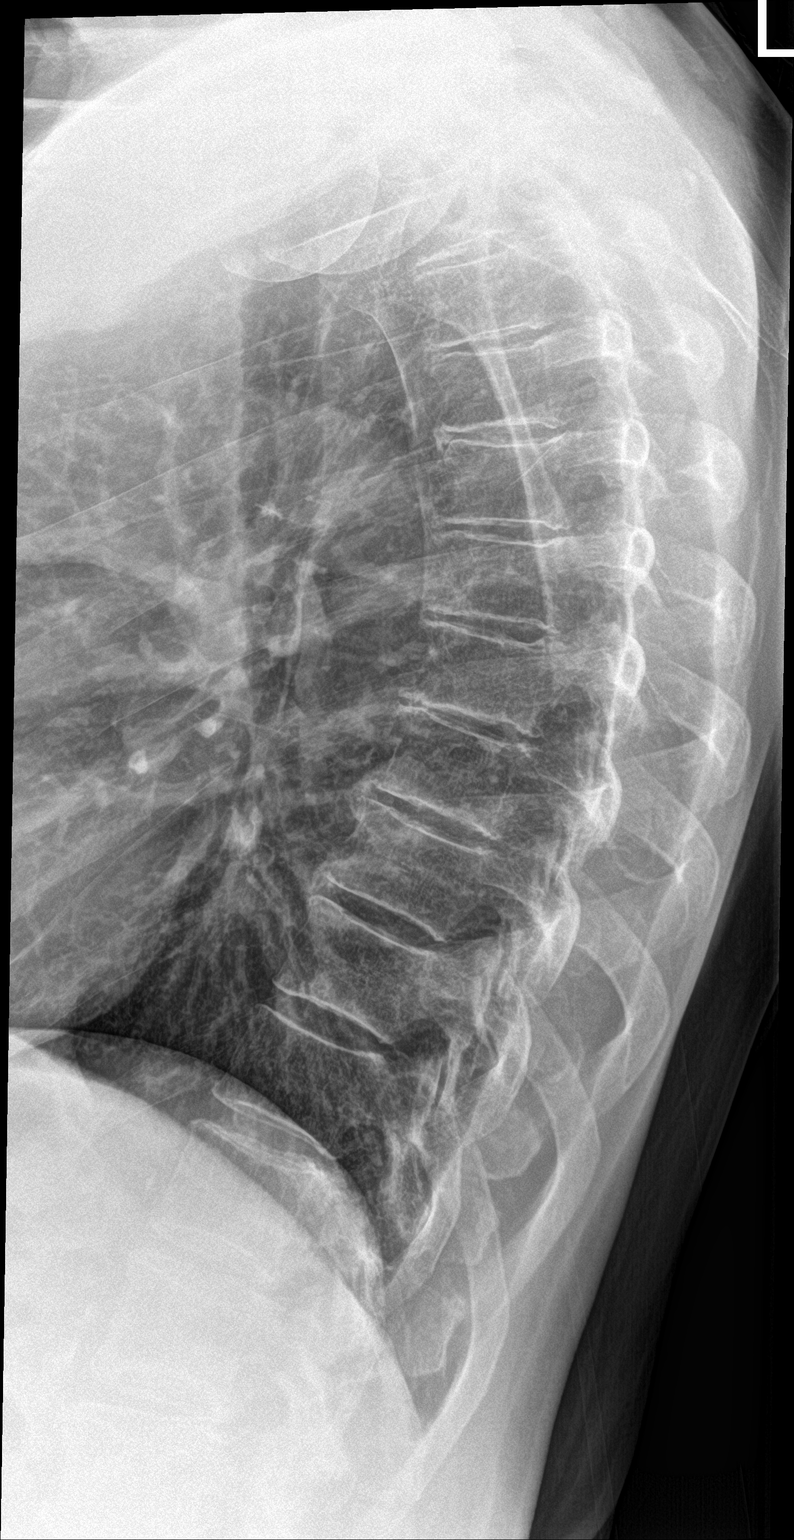

[t-spine swimmers]
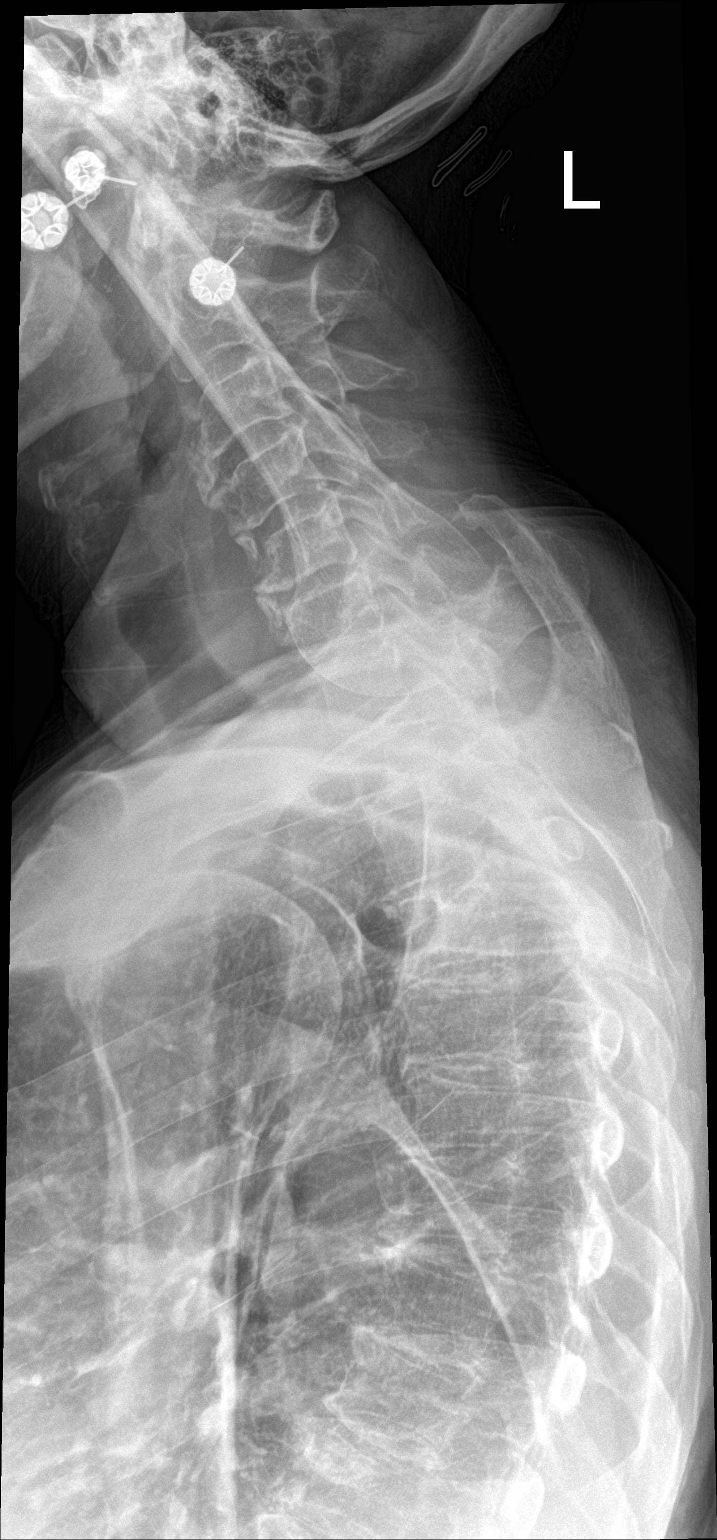

[3 of 3 positions shown; findings below may reference images not displayed]

FINDINGS: Vertebral body height is well maintained. Mild degenerative changes
of the thoracic spine are seen. The pedicles are within normal
limits and no paraspinal mass lesion is seen. No rib abnormality is
noted. No pneumothorax is seen. Degenerative changes in the cervical
spine are noted.
IMPRESSION: Degenerative change without acute abnormality.

## 2018-10-18 IMAGING — DX DG RIBS W/ CHEST 3+V*L*
3 series · 3 of 3 positions shown · non-contrast
Comparison: Thoracic spine film [DATE]

CLINICAL DATA: LEFT posterior rib pain

EXAM:
LEFT RIBS AND CHEST - 3+ VIEW

[chest pa]
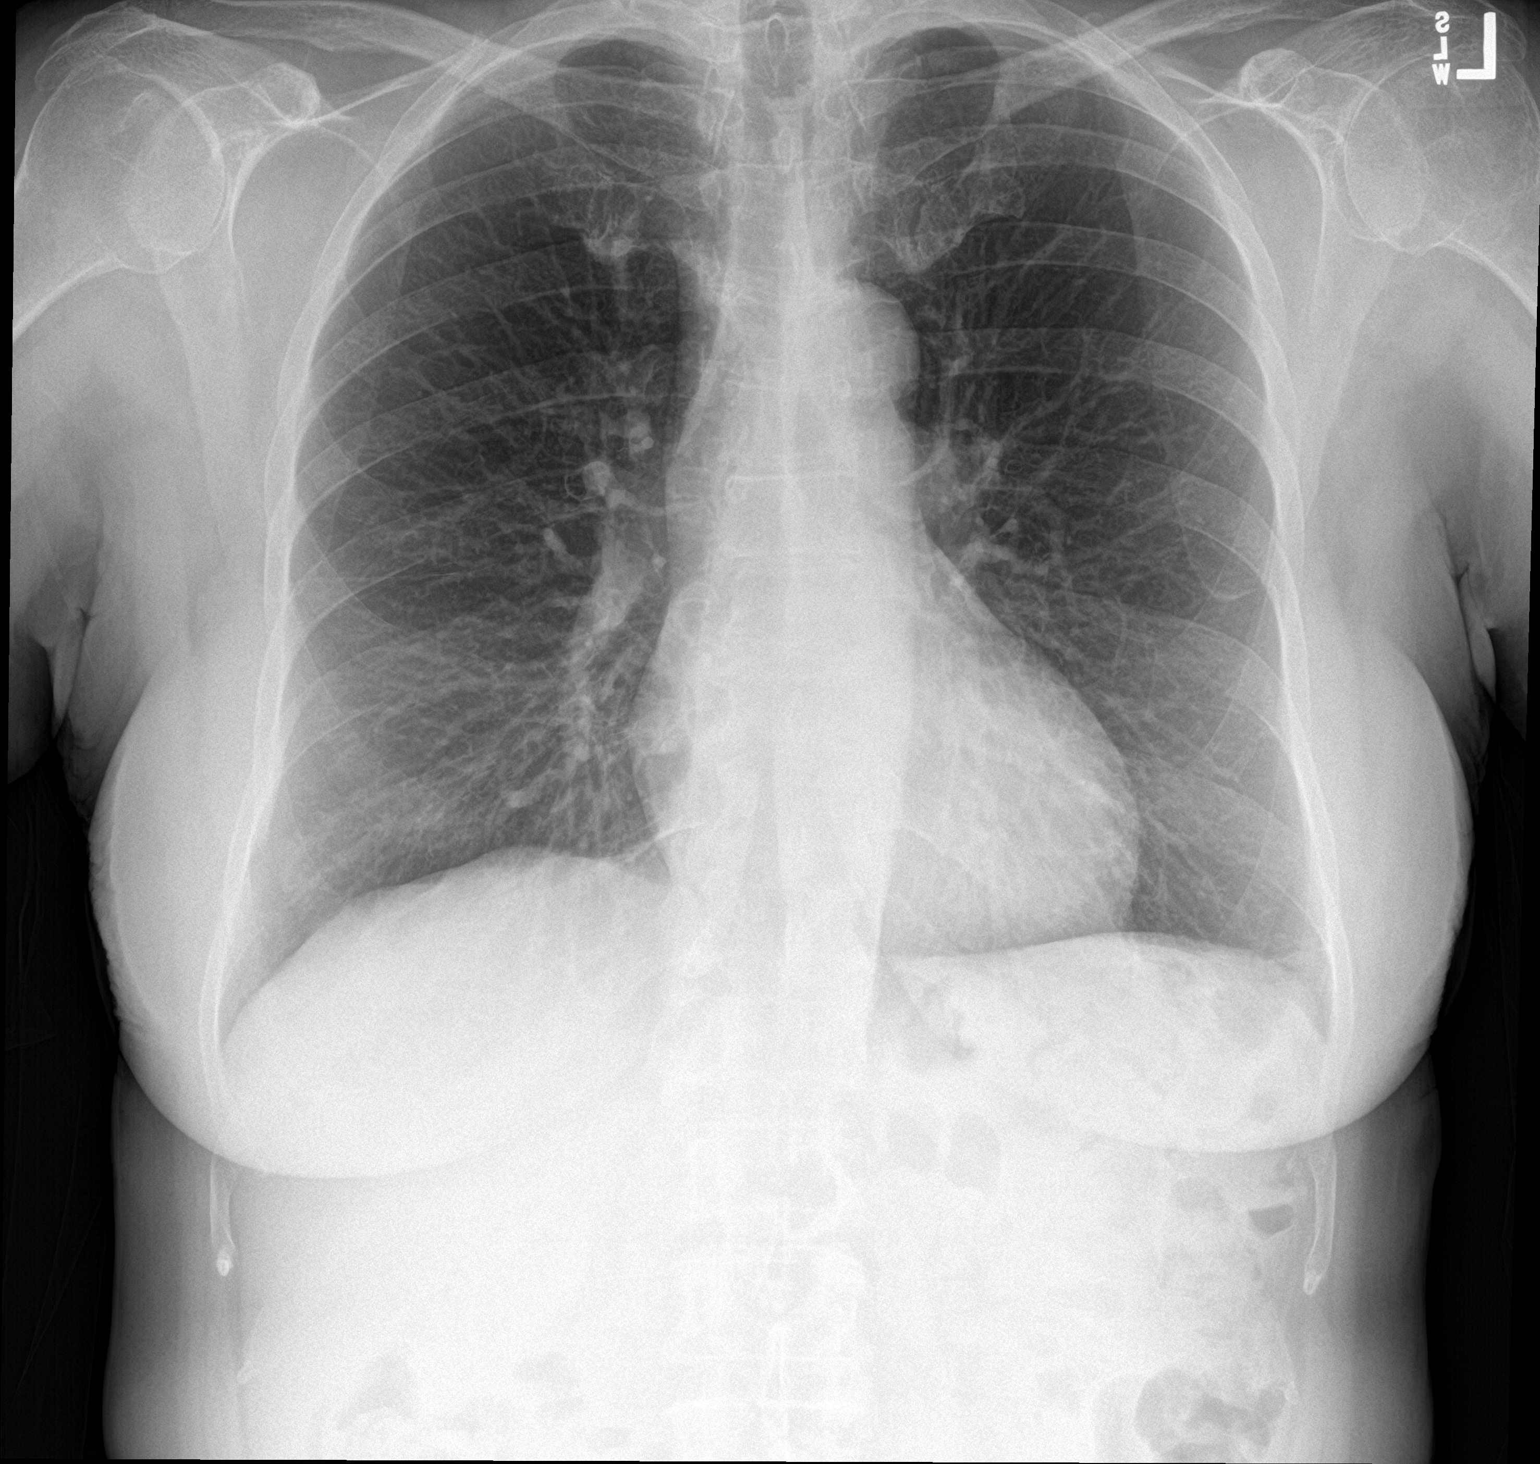

[rib ap]
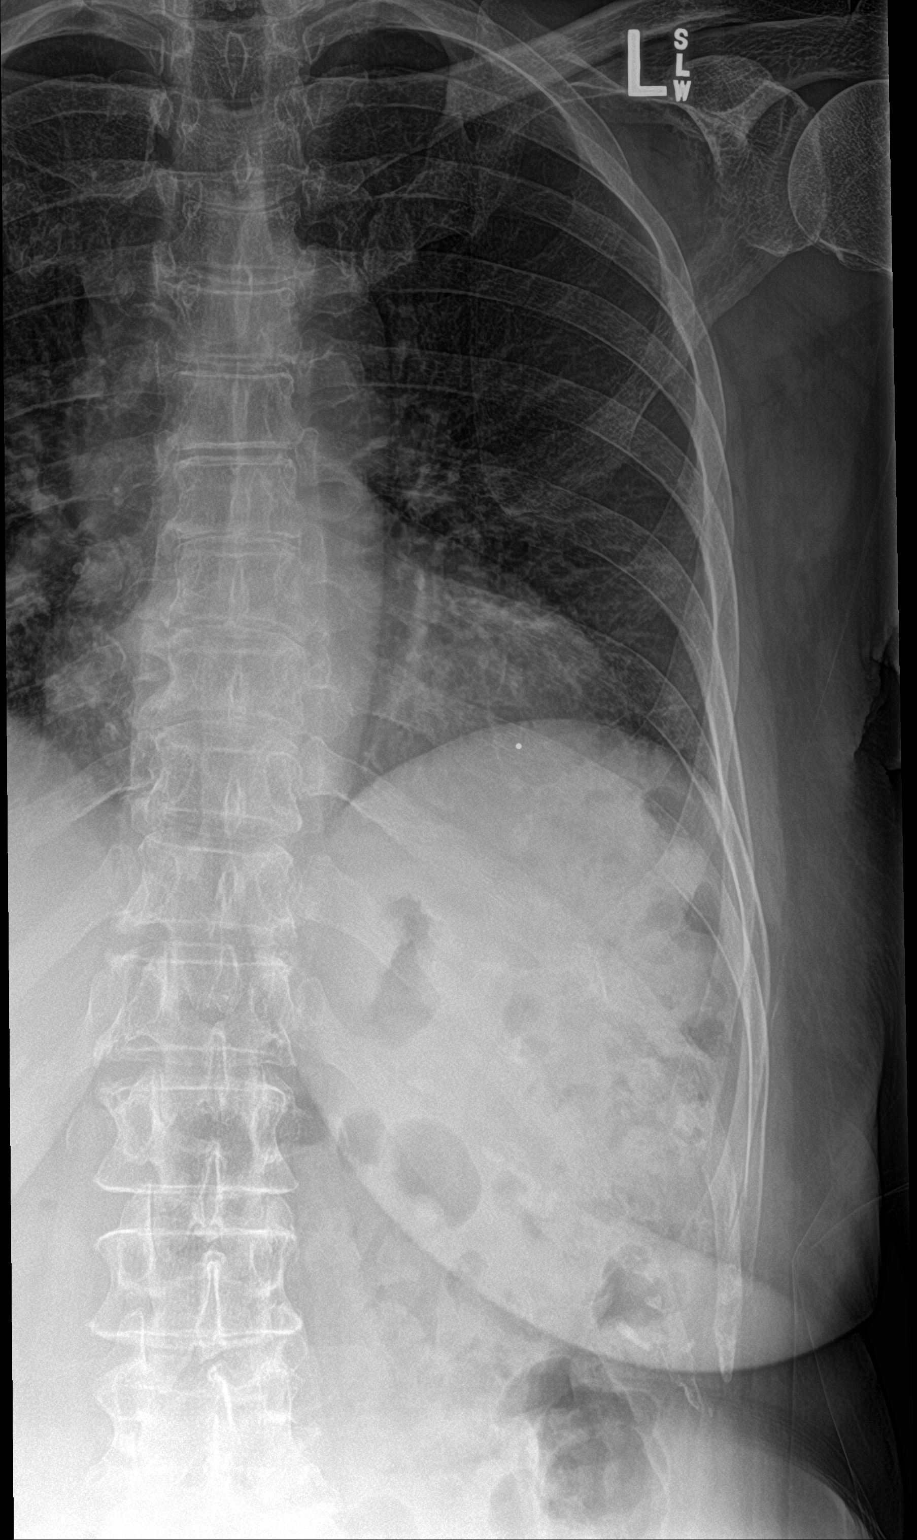

[rib ap obl]
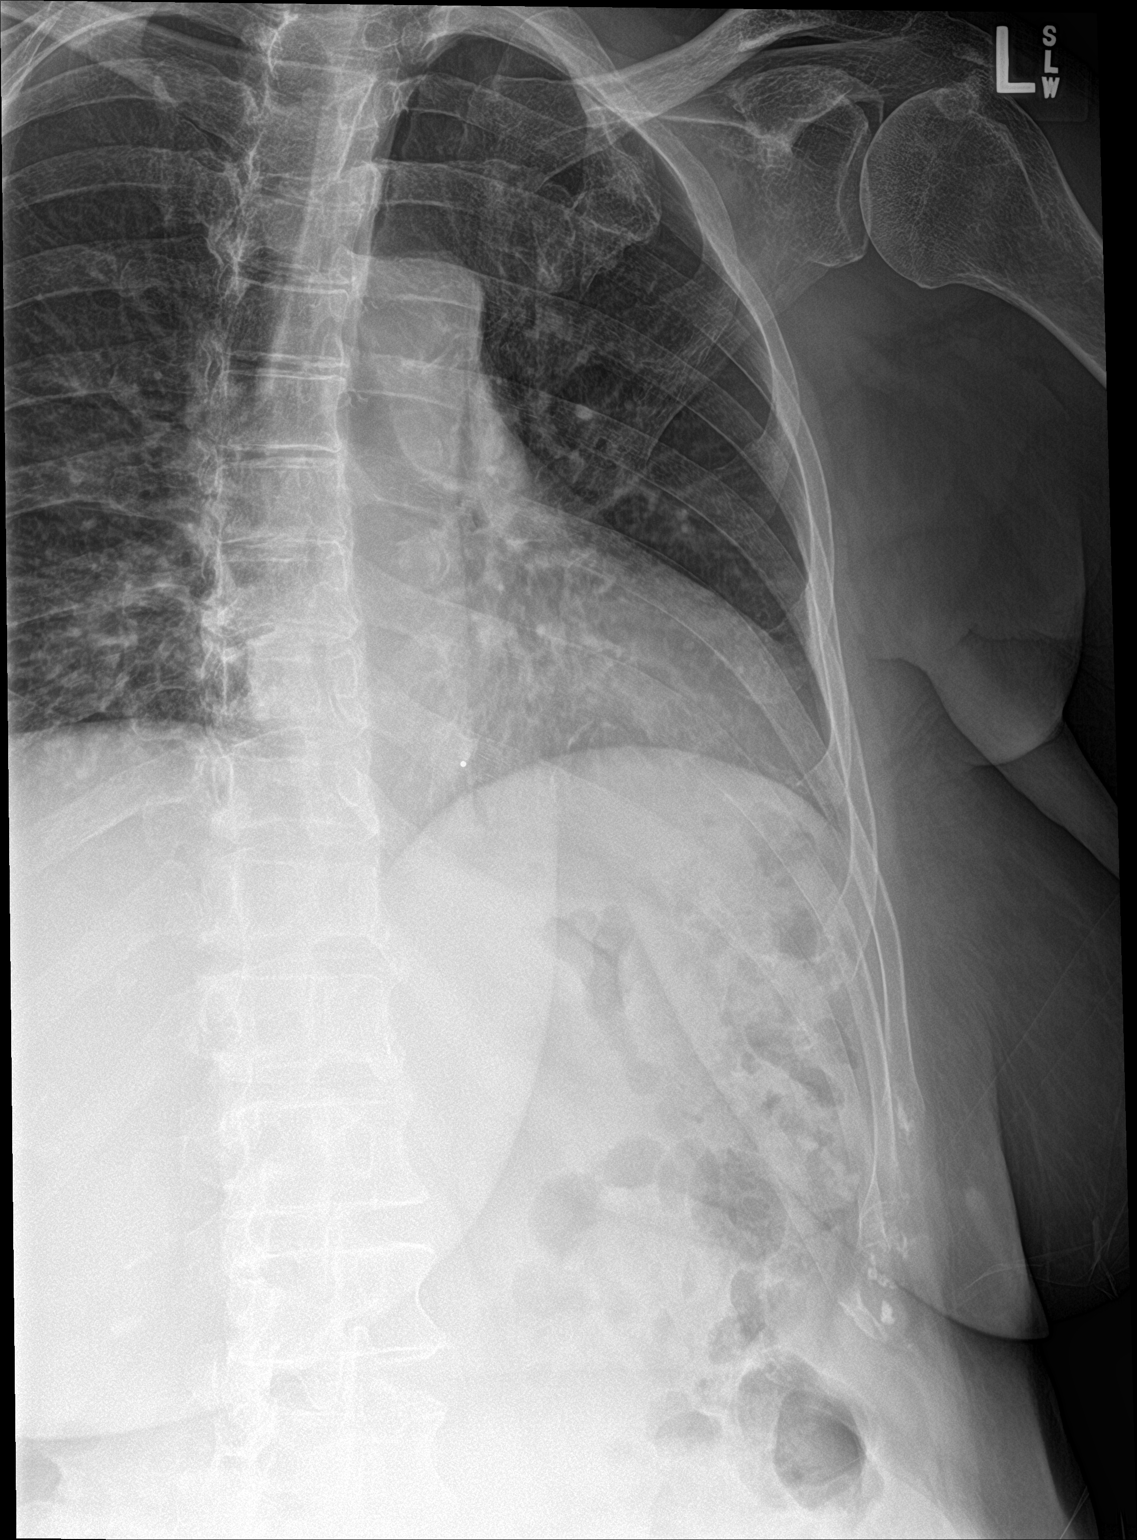

[3 of 3 positions shown; findings below may reference images not displayed]

FINDINGS: No fracture or other bone lesions are seen involving the ribs. There
is no evidence of pneumothorax or pleural effusion. Both lungs are
clear. Heart size and mediastinal contours are within normal limits.
IMPRESSION: No LEFT rib fracture or pneumothorax.

## 2018-10-18 MED ORDER — IBUPROFEN 200 MG PO TABS: ORAL_TABLET | ORAL | 0 refills | Status: DC

## 2018-10-18 NOTE — ED Provider Notes (Signed)
[facility]    CSN: [id number] Arrival date & time: 10/18/18  1241     History   Chief Complaint Chief Complaint  Patient presents with  . Fall    HPI Jenny Krueger is a 72 y.o. female.   HPI Accidentally fell yesterday on concrete injuring her left side, ribs and upper back.  There was no prodrome of lightheadedness or any chest pain or shortness of breath or palpitations prior to the accidental fall.  No loss of consciousness or head injury.  Denies neck injury or pain.  Denies low back injury or pain.  Denies abdominal pain. She complains of severe pain midline thoracic spine area and left posterior lateral ribs.  Severe pain with taking a deep breath.  No shortness of breath or cough.  No syncope or focal neurologic symptoms. She denies history of other falls.  Past Medical History:  Diagnosis Date  . Hyperlipidemia   . Hypertension   . Hypothyroid   Reviewed her EMR chart available, no history of chronic kidney disease or diabetes.  There are no active problems to display for this patient.   Past Surgical History:  Procedure Laterality Date  . No prior surgery    Denies prior surgery  OB History   No obstetric history on file.      Home Medications    Prior to Admission medications   Medication Sig Start Date End Date Taking? Authorizing Provider  amLODipine (NORVASC) 5 MG tablet Take 5 mg by mouth daily.   Yes [provider]  aspirin 81 MG tablet Take 81 mg by mouth daily.    [provider]  cholecalciferol (VITAMIN D) 1000 units tablet Take 1,000 Units by mouth 2 (two) times daily.    [provider]         IBUPROFEN PO Take 200 mg by mouth as needed.    [provider]  losartan (COZAAR) 50 MG tablet Take 50 mg by mouth daily.    [provider]  Probiotic Product (PROBIOTIC DAILY PO) Take 1 tablet by mouth daily.    [provider]  simvastatin (ZOCOR) 20 MG tablet Take 20 mg by  mouth daily.    [provider]    Family History Family History  Problem Relation Age of Onset  . Stroke Mother   . Thyroid disease Mother   . Diabetes Father   . Heart attack Father     Social History Social History   Tobacco Use  . Smoking status: Former Smoker    Types: Cigarettes    Last attempt to quit: 10/18/1988    Years since quitting: 30.0  . Smokeless tobacco: Never Used  Substance Use Topics  . Alcohol use: Yes  . Drug use: Never   She states she is active.  Denies drinking alcohol to excess.  Allergies   Codeine and Penicillin g   Review of Systems Review of Systems  Constitutional: Negative for fever.  Neurological: Negative.  Negative for dizziness and light-headedness.  Psychiatric/Behavioral: Negative for confusion.  All other systems reviewed and are negative.  Pertinent items noted in HPI and remainder of comprehensive ROS otherwise negative.    Physical Exam Triage Vital Signs ED Triage Vitals  Enc Vitals Group     BP 10/18/18 1339 (!) 147/72     Pulse Rate 10/18/18 1339 77     Resp --      Temp 10/18/18 1339 98.2 F (36.8 C)  Temp Source 10/18/18 1339 Oral     SpO2 --      Weight 10/18/18 1341 180 lb (81.6 kg)     Height 10/18/18 1341 5\' 5"  (1.651 m)     Head Circumference --      Peak Flow --      Pain Score 10/18/18 1340 5     Pain Loc --      Pain Edu? --      Excl. in GC? --    No data found.  Updated Vital Signs BP (!) 147/72 (BP Location: Right Arm)   Pulse 77   Temp 98.2 F (36.8 C) (Oral)   Ht 5\' 5"  (1.651 m)   Wt 81.6 kg   BMI 29.95 kg/m  Unable to obtain pulse ox because patient has very thick artificial nails and pulse ox device could not attach after multiple attempts. She does not appear dyspneic.  Respiratory rate 16 and unlabored.  Physical Exam Vitals signs reviewed.  Constitutional:      General: She is not in acute distress.    Appearance: She is well-developed.     Comments: Alert, no  acute cardiorespiratory distress, but she appears uncomfortable from left rib and upper back pain, she moves very slowly from sitting to standing, but she can ambulate slowly with pain.  HENT:     Head: Normocephalic and atraumatic.  Eyes:     General: No scleral icterus.    Pupils: Pupils are equal, round, and reactive to light.  Neck:     Musculoskeletal: Normal range of motion and neck supple.  Cardiovascular:     Rate and Rhythm: Normal rate and regular rhythm.  Pulmonary:     Effort: Pulmonary effort is normal.     Breath sounds: Normal breath sounds.     Comments: Lungs clear to auscultation although she has pain with inspiration.  Breath sounds equal bilaterally with good air excursion. Abdominal:     General: There is no distension.  Musculoskeletal:     Left shoulder: Normal. She exhibits normal range of motion, no tenderness and no bony tenderness.     Cervical back: Normal. She exhibits no tenderness.     Lumbar back: Normal. She exhibits no tenderness.     Left upper arm: Normal.       Arms:  Skin:    General: Skin is warm and dry.     Capillary Refill: Capillary refill takes less than 2 seconds.     Findings: No bruising.  Neurological:     General: No focal deficit present.     Mental Status: She is alert and oriented to person, place, and time.     Cranial Nerves: No cranial nerve deficit.     Sensory: No sensory deficit.     Motor: No weakness.  Psychiatric:        Behavior: Behavior normal.    Gait is within normal limits.  But she has left and mid upper back pain and left rib pain with walking.  UC Treatments / Results  Labs (all labs ordered are listed, but only abnormal results are displayed) Labs Reviewed - No data to display  EKG None  Radiology Dg Ribs Unilateral W/chest Left  Result Date: 10/18/2018 CLINICAL DATA:  LEFT posterior rib pain EXAM: LEFT RIBS AND CHEST - 3+ VIEW COMPARISON:  Thoracic spine film 10/18/2018 FINDINGS: No fracture or  other bone lesions are seen involving the ribs. There is no evidence of pneumothorax or pleural effusion.  Both lungs are clear. Heart size and mediastinal contours are within normal limits. IMPRESSION: No LEFT rib fracture or pneumothorax. Electronically Signed   By: Genevive BiStewart  Edmunds M.D.   On: 10/18/2018 14:31   Dg Thoracic Spine 2 View  Result Date: 10/18/2018 CLINICAL DATA:  Left-sided chest pain following fall 1 day ago, initial encounter EXAM: THORACIC SPINE 2 VIEWS COMPARISON:  None. FINDINGS: Vertebral body height is well maintained. Mild degenerative changes of the thoracic spine are seen. The pedicles are within normal limits and no paraspinal mass lesion is seen. No rib abnormality is noted. No pneumothorax is seen. Degenerative changes in the cervical spine are noted. IMPRESSION: Degenerative change without acute abnormality. Electronically Signed   By: Alcide CleverMark  Lukens M.D.   On: 10/18/2018 14:30    Procedures Procedures (including critical care time)  Medications Ordered in UC Medications - No data to display  Initial Impression / Assessment and Plan / UC Course  I have reviewed the triage vital signs and the nursing notes.  Pertinent labs & imaging results that were available during my care of the patient were reviewed by me and considered in my medical decision making (see chart for details).  Clinical Course as of Oct 18 1454  Thu Oct 18, 2018  1355 Discussed with patient.  Will order x-ray thoracic spine and left ribs.   [DM]    Clinical Course User Index [DM] Lajean ManesMassey, , MD   Reviewed x-rays with patient and gave her copies of reports.  X-rays thoracic spine and left ribs showed no acute bony abnormalities.  There are degenerative changes of thoracic spine, but vertebral heights are maintained.  No evidence of vertebral fracture.  No evidence of rib fracture. Final Clinical Impressions(s) / UC Diagnoses   Final diagnoses:  Contusion of ribs, left, initial encounter    Contusion of right back wall of thorax, initial encounter  Fall, initial encounter   ED Prescriptions    Medication Sig Dispense Auth. Provider   ibuprofen (ADVIL,MOTRIN) 200 MG tablet Take 2 tablets ( 400 milligrams total) every 8 with food as needed for pain.  Take with food 30 tablet Lajean ManesMassey, , MD    We discussed that she has severe contusions of left ribs and thoracic back, but no sign of fracture. We discussed symptomatic care and I explained that I anticipate that her pain and the contusions will eventually resolve within the next 2 to 3 weeks.  However, red flags discussed and red flags/symptoms that would warrant immediate medical attention.  Of note, no history of CKD, last creatinine normal 0.59, estimated GFR 93 on 07/30/2018.  We still discussed precautions of NSAIDs.  I offered, but she declined any prescription for any other pain reliever.  She declined AVS She declined rib belt but other symptomatic care discussed. Questions invited and answered. She voiced understanding and agreement with plans and instructions.    Lajean ManesMassey, , MD 10/18/18 1946

## 2018-10-18 NOTE — ED Triage Notes (Signed)
Fell yesterday on concrete injuring her left side, ribs and upper back.

## 2020-08-03 DIAGNOSIS — M5136 Other intervertebral disc degeneration, lumbar region: Secondary | ICD-10-CM | POA: Insufficient documentation

## 2020-11-10 DIAGNOSIS — M25562 Pain in left knee: Secondary | ICD-10-CM | POA: Diagnosis not present

## 2020-11-10 DIAGNOSIS — M25561 Pain in right knee: Secondary | ICD-10-CM | POA: Diagnosis not present

## 2020-11-10 DIAGNOSIS — M62830 Muscle spasm of back: Secondary | ICD-10-CM | POA: Diagnosis not present

## 2020-11-10 DIAGNOSIS — M9901 Segmental and somatic dysfunction of cervical region: Secondary | ICD-10-CM | POA: Diagnosis not present

## 2020-12-10 DIAGNOSIS — M9901 Segmental and somatic dysfunction of cervical region: Secondary | ICD-10-CM | POA: Diagnosis not present

## 2020-12-10 DIAGNOSIS — M62830 Muscle spasm of back: Secondary | ICD-10-CM | POA: Diagnosis not present

## 2020-12-15 DIAGNOSIS — M9901 Segmental and somatic dysfunction of cervical region: Secondary | ICD-10-CM | POA: Diagnosis not present

## 2020-12-15 DIAGNOSIS — M62830 Muscle spasm of back: Secondary | ICD-10-CM | POA: Diagnosis not present

## 2020-12-22 DIAGNOSIS — M9901 Segmental and somatic dysfunction of cervical region: Secondary | ICD-10-CM | POA: Diagnosis not present

## 2020-12-22 DIAGNOSIS — M62830 Muscle spasm of back: Secondary | ICD-10-CM | POA: Diagnosis not present

## 2021-01-05 DIAGNOSIS — M9901 Segmental and somatic dysfunction of cervical region: Secondary | ICD-10-CM | POA: Diagnosis not present

## 2021-01-05 DIAGNOSIS — M62830 Muscle spasm of back: Secondary | ICD-10-CM | POA: Diagnosis not present

## 2021-01-19 DIAGNOSIS — M9901 Segmental and somatic dysfunction of cervical region: Secondary | ICD-10-CM | POA: Diagnosis not present

## 2021-01-19 DIAGNOSIS — M62830 Muscle spasm of back: Secondary | ICD-10-CM | POA: Diagnosis not present

## 2021-01-20 DIAGNOSIS — M25562 Pain in left knee: Secondary | ICD-10-CM | POA: Diagnosis not present

## 2021-01-20 DIAGNOSIS — M25561 Pain in right knee: Secondary | ICD-10-CM | POA: Diagnosis not present

## 2021-04-08 DIAGNOSIS — M9901 Segmental and somatic dysfunction of cervical region: Secondary | ICD-10-CM | POA: Diagnosis not present

## 2021-04-08 DIAGNOSIS — M62830 Muscle spasm of back: Secondary | ICD-10-CM | POA: Diagnosis not present

## 2022-04-07 ENCOUNTER — Emergency Department (HOSPITAL_COMMUNITY): Payer: Medicare Other

## 2022-04-07 ENCOUNTER — Inpatient Hospital Stay (HOSPITAL_COMMUNITY)
Admission: EM | Admit: 2022-04-07 | Discharge: 2022-04-14 | DRG: 871 | Disposition: A | Discharge status: Home / Self Care | Payer: Medicare Other | Attending: Internal Medicine | Admitting: Internal Medicine

## 2022-04-07 ENCOUNTER — Other Ambulatory Visit: Payer: Self-pay

## 2022-04-07 ENCOUNTER — Encounter (HOSPITAL_COMMUNITY): Payer: Self-pay

## 2022-04-07 DIAGNOSIS — I10 Essential (primary) hypertension: Secondary | ICD-10-CM | POA: Diagnosis not present

## 2022-04-07 DIAGNOSIS — E8809 Other disorders of plasma-protein metabolism, not elsewhere classified: Secondary | ICD-10-CM | POA: Diagnosis not present

## 2022-04-07 DIAGNOSIS — Z885 Allergy status to narcotic agent status: Secondary | ICD-10-CM

## 2022-04-07 DIAGNOSIS — Z8349 Family history of other endocrine, nutritional and metabolic diseases: Secondary | ICD-10-CM

## 2022-04-07 DIAGNOSIS — Z87891 Personal history of nicotine dependence: Secondary | ICD-10-CM

## 2022-04-07 DIAGNOSIS — E876 Hypokalemia: Secondary | ICD-10-CM | POA: Diagnosis present

## 2022-04-07 DIAGNOSIS — Z6833 Body mass index (BMI) 33.0-33.9, adult: Secondary | ICD-10-CM

## 2022-04-07 DIAGNOSIS — Z8249 Family history of ischemic heart disease and other diseases of the circulatory system: Secondary | ICD-10-CM

## 2022-04-07 DIAGNOSIS — Z880 Allergy status to penicillin: Secondary | ICD-10-CM

## 2022-04-07 DIAGNOSIS — M797 Fibromyalgia: Secondary | ICD-10-CM | POA: Diagnosis present

## 2022-04-07 DIAGNOSIS — R410 Disorientation, unspecified: Secondary | ICD-10-CM | POA: Diagnosis not present

## 2022-04-07 DIAGNOSIS — Z20822 Contact with and (suspected) exposure to covid-19: Secondary | ICD-10-CM | POA: Diagnosis present

## 2022-04-07 DIAGNOSIS — J019 Acute sinusitis, unspecified: Secondary | ICD-10-CM | POA: Diagnosis present

## 2022-04-07 DIAGNOSIS — L270 Generalized skin eruption due to drugs and medicaments taken internally: Secondary | ICD-10-CM | POA: Diagnosis not present

## 2022-04-07 DIAGNOSIS — R6510 Systemic inflammatory response syndrome (SIRS) of non-infectious origin without acute organ dysfunction: Principal | ICD-10-CM

## 2022-04-07 DIAGNOSIS — E669 Obesity, unspecified: Secondary | ICD-10-CM | POA: Diagnosis present

## 2022-04-07 DIAGNOSIS — A4159 Other Gram-negative sepsis: Secondary | ICD-10-CM | POA: Diagnosis not present

## 2022-04-07 DIAGNOSIS — G039 Meningitis, unspecified: Secondary | ICD-10-CM

## 2022-04-07 DIAGNOSIS — Z7982 Long term (current) use of aspirin: Secondary | ICD-10-CM

## 2022-04-07 DIAGNOSIS — G9341 Metabolic encephalopathy: Secondary | ICD-10-CM | POA: Diagnosis present

## 2022-04-07 DIAGNOSIS — Z79899 Other long term (current) drug therapy: Secondary | ICD-10-CM

## 2022-04-07 DIAGNOSIS — Z833 Family history of diabetes mellitus: Secondary | ICD-10-CM

## 2022-04-07 DIAGNOSIS — M5090 Cervical disc disorder, unspecified, unspecified cervical region: Secondary | ICD-10-CM | POA: Insufficient documentation

## 2022-04-07 DIAGNOSIS — B961 Klebsiella pneumoniae [K. pneumoniae] as the cause of diseases classified elsewhere: Secondary | ICD-10-CM | POA: Diagnosis present

## 2022-04-07 DIAGNOSIS — G000 Hemophilus meningitis: Secondary | ICD-10-CM | POA: Diagnosis present

## 2022-04-07 DIAGNOSIS — E7800 Pure hypercholesterolemia, unspecified: Secondary | ICD-10-CM | POA: Diagnosis present

## 2022-04-07 DIAGNOSIS — N390 Urinary tract infection, site not specified: Secondary | ICD-10-CM | POA: Diagnosis present

## 2022-04-07 DIAGNOSIS — T361X5A Adverse effect of cephalosporins and other beta-lactam antibiotics, initial encounter: Secondary | ICD-10-CM | POA: Diagnosis not present

## 2022-04-07 DIAGNOSIS — Z823 Family history of stroke: Secondary | ICD-10-CM

## 2022-04-07 DIAGNOSIS — E039 Hypothyroidism, unspecified: Secondary | ICD-10-CM | POA: Insufficient documentation

## 2022-04-07 HISTORY — DX: Meningitis, unspecified: G03.9

## 2022-04-07 LAB — CBC WITH DIFFERENTIAL/PLATELET
Abs Immature Granulocytes: 0.2 10*3/uL — ABNORMAL HIGH (ref 0.00–0.07)
Basophils Absolute: 0 10*3/uL (ref 0.0–0.1)
Basophils Relative: 0 %
Eosinophils Absolute: 0 10*3/uL (ref 0.0–0.5)
Eosinophils Relative: 0 %
HCT: 42.1 % (ref 36.0–46.0)
Hemoglobin: 14.3 g/dL (ref 12.0–15.0)
Immature Granulocytes: 1 %
Lymphocytes Relative: 4 %
Lymphs Abs: 0.8 10*3/uL (ref 0.7–4.0)
MCH: 31.8 pg (ref 26.0–34.0)
MCHC: 34 g/dL (ref 30.0–36.0)
MCV: 93.6 fL (ref 80.0–100.0)
Monocytes Absolute: 1.4 10*3/uL — ABNORMAL HIGH (ref 0.1–1.0)
Monocytes Relative: 8 %
Neutro Abs: 15.8 10*3/uL — ABNORMAL HIGH (ref 1.7–7.7)
Neutrophils Relative %: 87 %
Platelets: 283 10*3/uL (ref 150–400)
RBC: 4.5 MIL/uL (ref 3.87–5.11)
RDW: 12.9 % (ref 11.5–15.5)
WBC: 18.2 10*3/uL — ABNORMAL HIGH (ref 4.0–10.5)
nRBC: 0 % (ref 0.0–0.2)

## 2022-04-07 LAB — PROTIME-INR
INR: 1 (ref 0.8–1.2)
Prothrombin Time: 13.1 seconds (ref 11.4–15.2)

## 2022-04-07 LAB — LACTIC ACID, PLASMA: Lactic Acid, Venous: 1.9 mmol/L (ref 0.5–1.9)

## 2022-04-07 LAB — COMPREHENSIVE METABOLIC PANEL
ALT: 17 U/L (ref 0–44)
AST: 25 U/L (ref 15–41)
Albumin: 3.5 g/dL (ref 3.5–5.0)
Alkaline Phosphatase: 101 U/L (ref 38–126)
Anion gap: 9 (ref 5–15)
BUN: 19 mg/dL (ref 8–23)
CO2: 27 mmol/L (ref 22–32)
Calcium: 9.3 mg/dL (ref 8.9–10.3)
Chloride: 104 mmol/L (ref 98–111)
Creatinine, Ser: 0.75 mg/dL (ref 0.44–1.00)
GFR, Estimated: >60 mL/min (ref >60)
Glucose, Bld: 136 mg/dL — ABNORMAL HIGH (ref 70–99)
Potassium: 3.3 mmol/L — ABNORMAL LOW (ref 3.5–5.1)
Sodium: 140 mmol/L (ref 135–145)
Total Bilirubin: 1.3 mg/dL — ABNORMAL HIGH (ref 0.3–1.2)
Total Protein: 7.4 g/dL (ref 6.5–8.1)

## 2022-04-07 LAB — SARS CORONAVIRUS 2 BY RT PCR: SARS Coronavirus 2 by RT PCR: NEGATIVE

## 2022-04-07 IMAGING — DX DG CHEST 1V PORT
1 series · 1 of 1 positions shown · non-contrast
Comparison: [DATE]

CLINICAL DATA: Fever, vomiting, diarrhea, suspected sepsis and
altered mental status.

EXAM:
PORTABLE CHEST 1 VIEW

[chest ap]
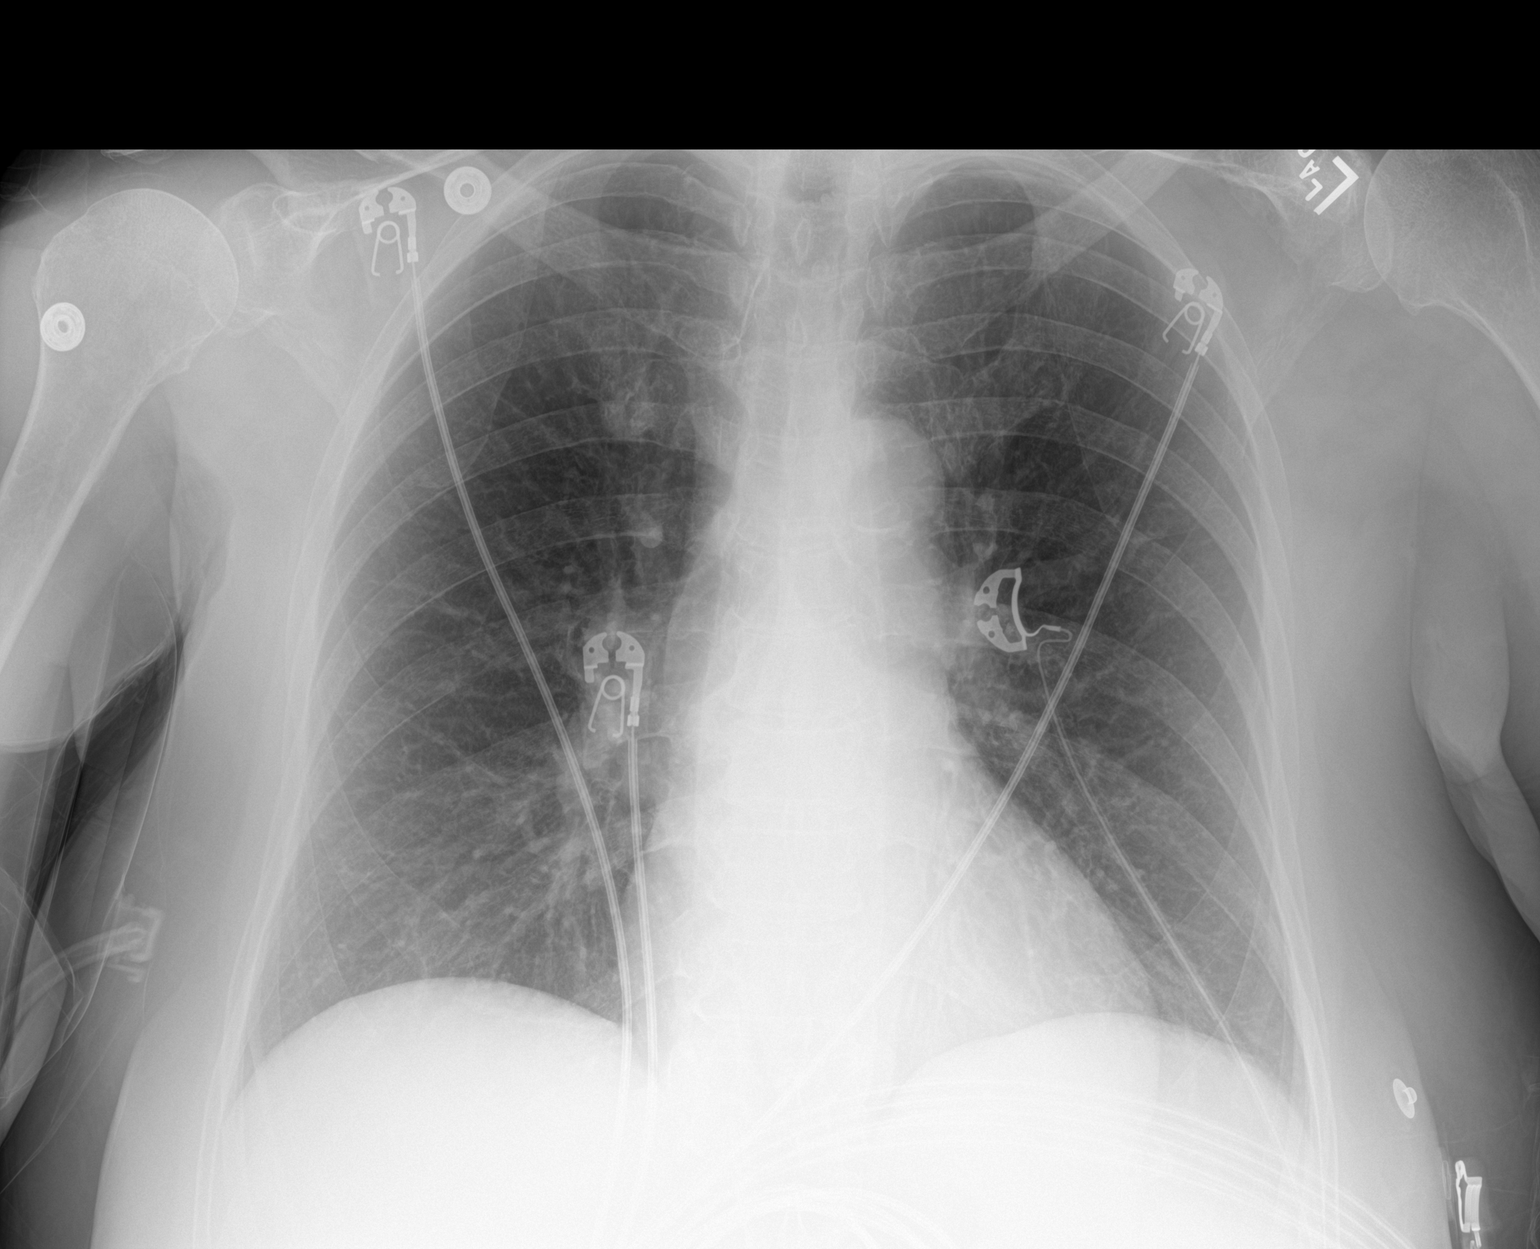

[1 of 1 positions shown; findings below may reference images not displayed]

FINDINGS: The heart size and mediastinal contours are within normal limits.
Both lungs are clear. The visualized skeletal structures are
unremarkable.
IMPRESSION: No active disease.

## 2022-04-07 IMAGING — CT CT HEAD W/O CM
3 series · 15 of 47 positions shown, 18 images · non-contrast
Comparison: None Available.

CLINICAL DATA: Mental status change, unknown cause.



[Series 2: head wo · axial · 0.43mm/px · z∈[-139,-4]mm · 9 of 33 slices shown, 12 images]
[im 3/33  brain]
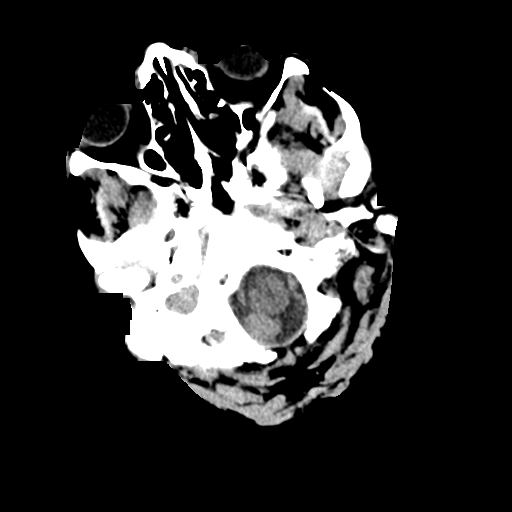
[im 3/33  bone]
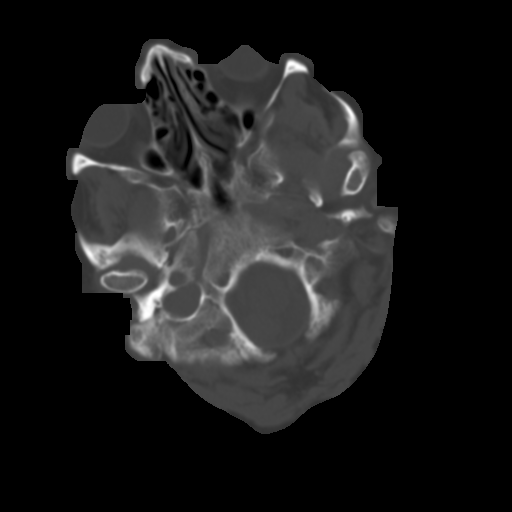
[im 6/33  brain]
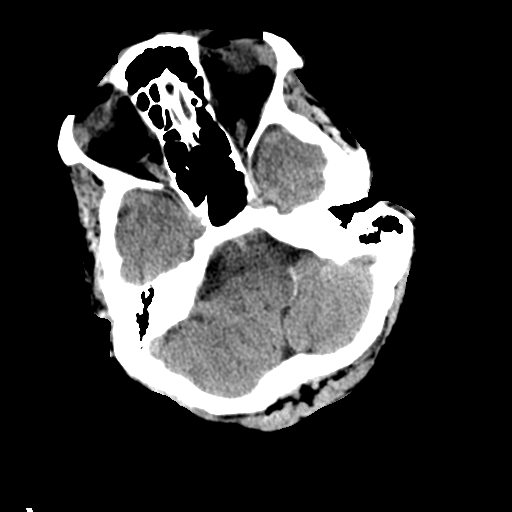
[im 9/33  brain]
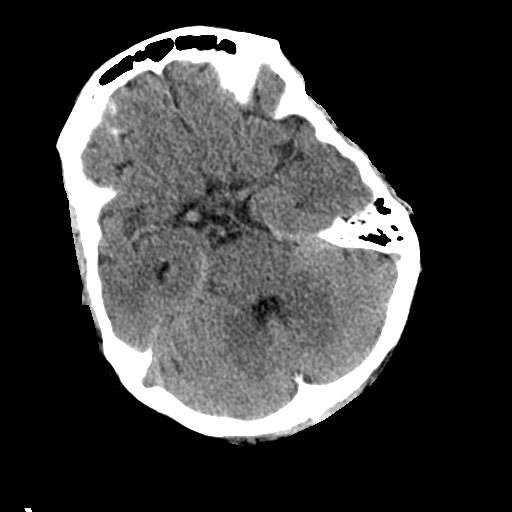
[im 13/33  brain]
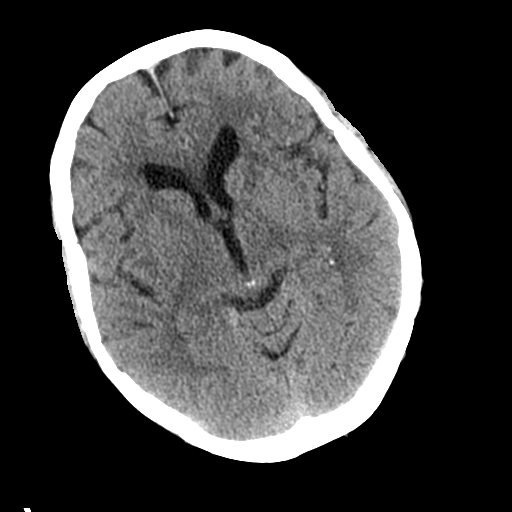
[im 17/33  brain]
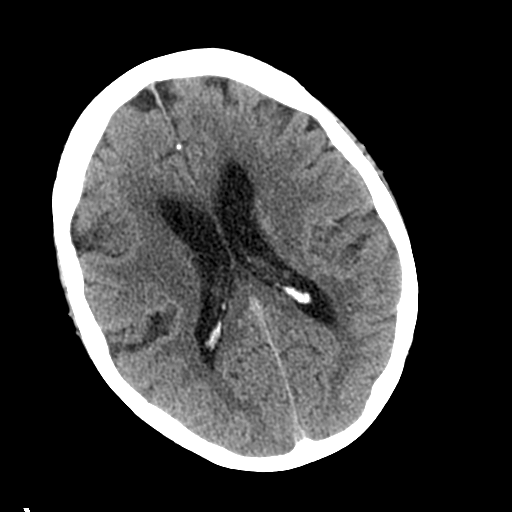
[im 17/33  bone]
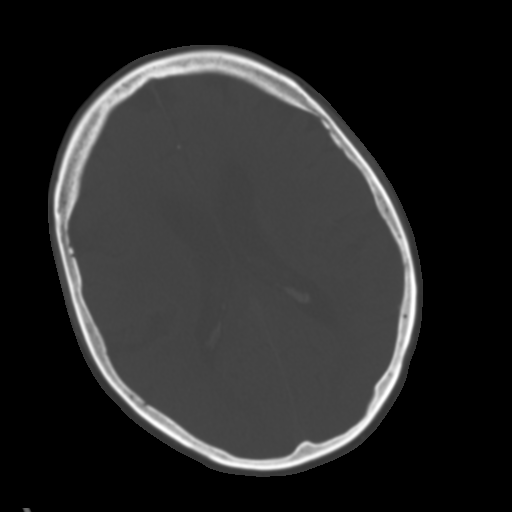
[im 20/33  brain]
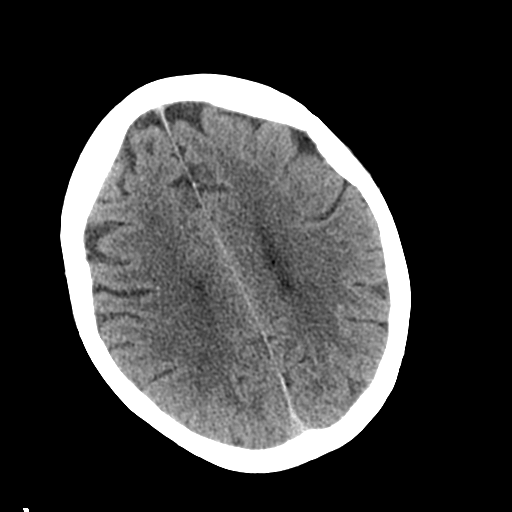
[im 24/33  brain]
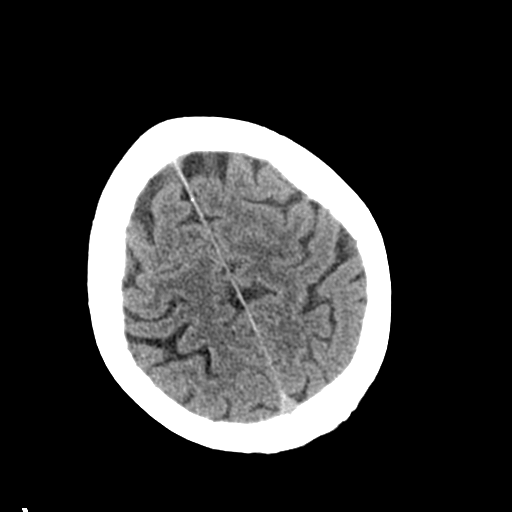
[im 27/33  brain]
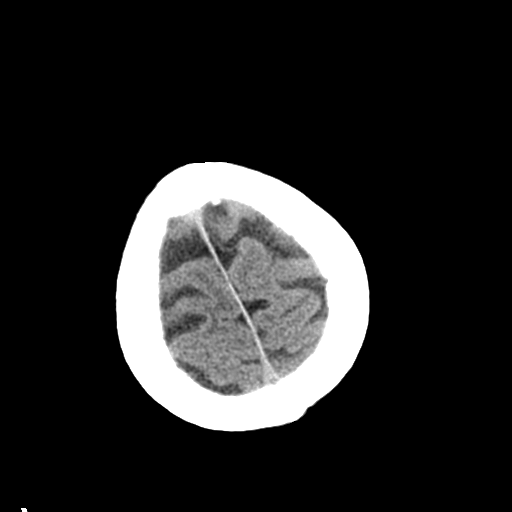
[im 30/33  brain]
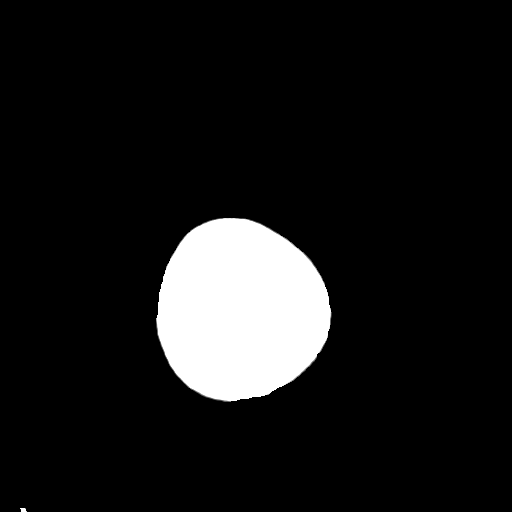
[im 30/33  bone]
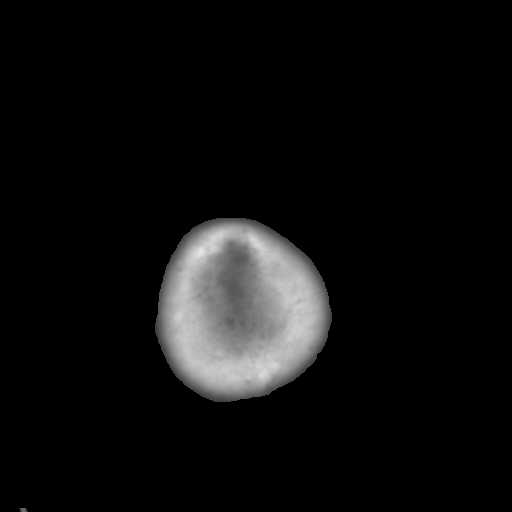

[Series 5: coronal soft tissue · coronal · 0.32mm/px · 3 of 73 slices shown]
[im 25/73  brain]
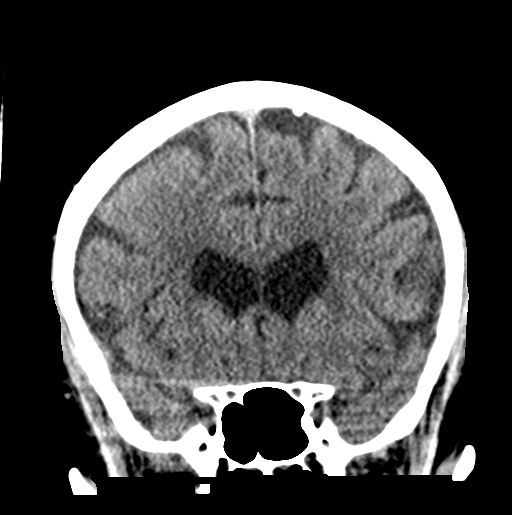
[im 33/73  brain]
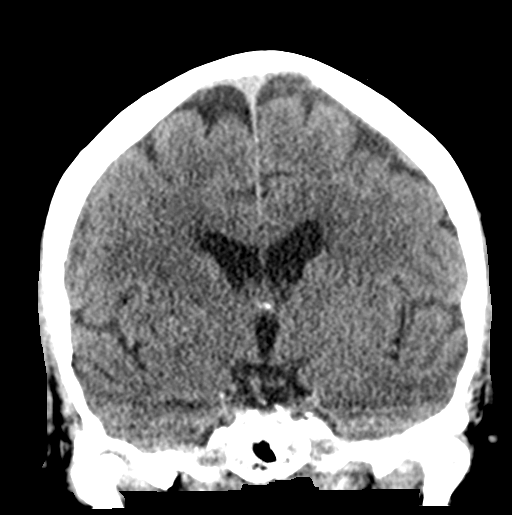
[im 41/73  brain]
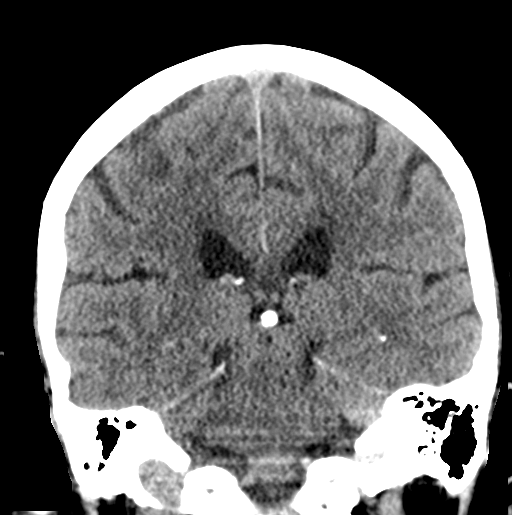

[Series 6: sagittal soft tissue · sagittal · 0.32mm/px · 3 of 55 slices shown]
[im 19/55  brain]
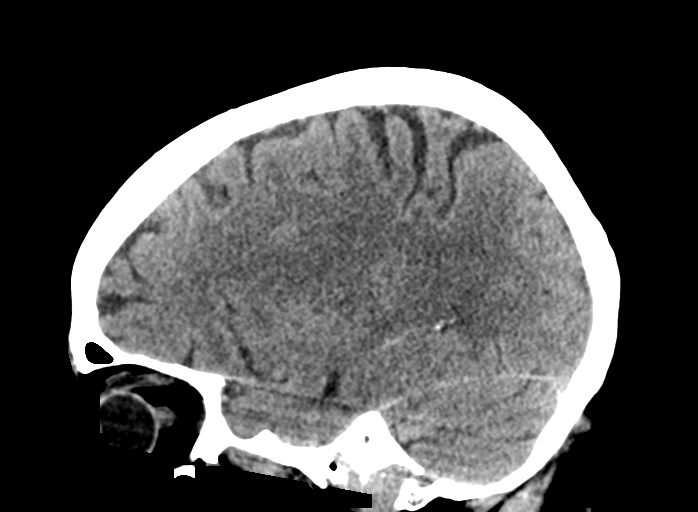
[im 28/55  brain]
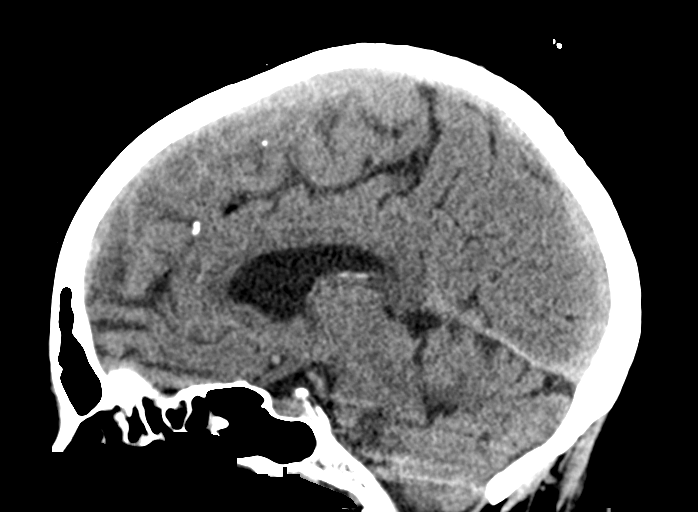
[im 37/55  brain]
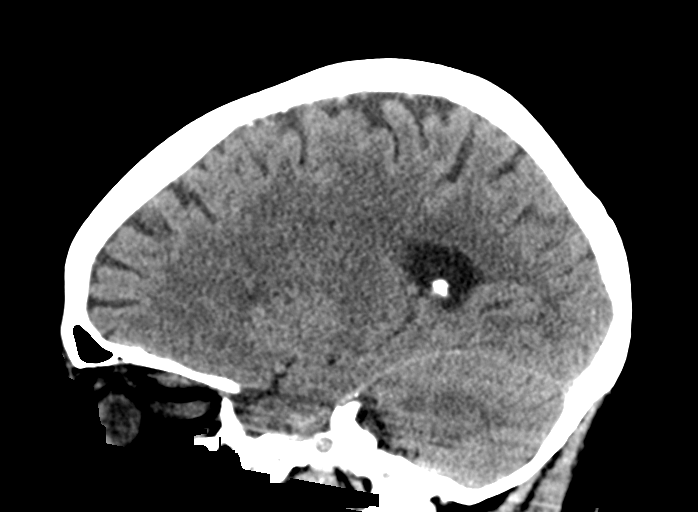

[15 of 47 positions shown; findings below may reference images not displayed]

FINDINGS: Brain: There is mild cerebral atrophy, small-vessel disease and
atrophic ventriculomegaly. The cerebellum and brainstem are
unremarkable.

There is no focal asymmetry worrisome for acute cortical based
infarct, hemorrhage, mass effect or midline shift. Basal cisterns
are patent.

Vascular: There are patchy calcifications in the carotid siphons but
no hyperdense central vasculature.

Skull: No fracture or focal lesion.

Sinuses/Orbits: There is mild membrane thickening in the paranasal
sinuses without fluid levels. There has been a prior right lens
extraction.

There is patchy fluid in the lower right mastoid air cells.
Remaining mastoid air cells are clear.

Other: None.
IMPRESSION: Atrophy and small-vessel disease with no acute intracranial CT
findings.

Sinus and right mastoid disease as described above.

## 2022-04-07 MED ORDER — AMLODIPINE BESYLATE 5 MG PO TABS
5.0000 mg | ORAL_TABLET | Freq: Every day | ORAL | Status: DC
  Administered 2022-04-09 – 2022-04-13 (×5): 5 mg via ORAL
  Filled 2022-04-07 (×6): qty 1

## 2022-04-07 MED ORDER — ACETAMINOPHEN 500 MG PO TABS
1000.0000 mg | ORAL_TABLET | Freq: Once | ORAL | Status: AC
  Administered 2022-04-07: 1000 mg via ORAL
  Filled 2022-04-07: qty 2

## 2022-04-07 MED ORDER — ENOXAPARIN SODIUM 40 MG/0.4ML IJ SOSY
40.0000 mg | PREFILLED_SYRINGE | INTRAMUSCULAR | Status: DC
  Administered 2022-04-08 – 2022-04-14 (×7): 40 mg via SUBCUTANEOUS
  Filled 2022-04-07 (×7): qty 0.4

## 2022-04-07 MED ORDER — POLYETHYLENE GLYCOL 3350 17 G PO PACK: 17.0000 g | PACK | Freq: Every day | ORAL | Status: DC | PRN

## 2022-04-07 MED ORDER — LACTATED RINGERS IV BOLUS (SEPSIS)
1000.0000 mL | Freq: Once | INTRAVENOUS | Status: AC
  Administered 2022-04-08: 1000 mL via INTRAVENOUS

## 2022-04-07 MED ORDER — ONDANSETRON HCL 4 MG/2ML IJ SOLN
4.0000 mg | Freq: Four times a day (QID) | INTRAMUSCULAR | Status: DC | PRN
  Administered 2022-04-08 – 2022-04-10 (×4): 4 mg via INTRAVENOUS
  Filled 2022-04-07 (×5): qty 2

## 2022-04-07 MED ORDER — ONDANSETRON HCL 4 MG PO TABS
4.0000 mg | ORAL_TABLET | Freq: Four times a day (QID) | ORAL | Status: DC | PRN
  Administered 2022-04-09: 4 mg via ORAL
  Filled 2022-04-07 (×2): qty 1

## 2022-04-07 MED ORDER — ACETAMINOPHEN 650 MG RE SUPP
650.0000 mg | Freq: Four times a day (QID) | RECTAL | Status: DC | PRN
  Administered 2022-04-08: 650 mg via RECTAL
  Filled 2022-04-07: qty 1

## 2022-04-07 MED ORDER — LACTATED RINGERS IV SOLN: INTRAVENOUS | Status: DC

## 2022-04-07 MED ORDER — SODIUM CHLORIDE 0.9% FLUSH
3.0000 mL | Freq: Two times a day (BID) | INTRAVENOUS | Status: DC
  Administered 2022-04-09 – 2022-04-13 (×10): 3 mL via INTRAVENOUS

## 2022-04-07 MED ORDER — PIPERACILLIN-TAZOBACTAM 3.375 G IVPB 30 MIN
3.3750 g | Freq: Once | INTRAVENOUS | Status: AC
  Administered 2022-04-07: 3.375 g via INTRAVENOUS
  Filled 2022-04-07: qty 50

## 2022-04-07 MED ORDER — VANCOMYCIN HCL IN DEXTROSE 1-5 GM/200ML-% IV SOLN
1000.0000 mg | Freq: Once | INTRAVENOUS | Status: AC
  Administered 2022-04-07: 1000 mg via INTRAVENOUS
  Filled 2022-04-07: qty 200

## 2022-04-07 MED ORDER — ONDANSETRON HCL 4 MG/2ML IJ SOLN
4.0000 mg | Freq: Once | INTRAMUSCULAR | Status: AC
  Administered 2022-04-07: 4 mg via INTRAVENOUS
  Filled 2022-04-07: qty 2

## 2022-04-07 MED ORDER — ACETAMINOPHEN 325 MG PO TABS
650.0000 mg | ORAL_TABLET | Freq: Four times a day (QID) | ORAL | Status: DC | PRN
  Administered 2022-04-09 – 2022-04-14 (×12): 650 mg via ORAL
  Filled 2022-04-07 (×12): qty 2

## 2022-04-07 NOTE — H&P (Signed)
History and Physical   Jenny Krueger KLK:[id number] DOB: 1946/08/23 DOA: 04/07/2022  PCP: Juluis Rainier, MD (Inactive)   Patient coming from: Home  Chief Complaint: AMS  HPI: Jenny Krueger is a 76 y.o. female with medical history significant of hypertension, hyperlipidemia, hypothyroidism, degenerative disc disease, fibromyalgia presenting with altered mental status.  History obtained with assistance of chart review and family.  Patient had been seemingly at baseline but was noted to be altered today when visiting with family.  Has had recent sinus infections.  Earlier today was noted to have increased somnolence after taking Benadryl.  Upon waking did have episode of nausea and vomiting.  Denies fevers, chills, chest pain, shortness of breath, abdominal pain, constipation, diarrhea***  ED Course: Vital signs in the ED notable for fever to 101.1, tachypnea in the 20s, blood pressure in the 130s to 140s systolic.  Lab work-up included CMP with potassium 3.3, glucose 136, T. bili 1.3.  CBC with leukocytosis to 18.2.  PT and INR within normal limits.  Lactic acid normal with repeat pending.  COVID negative.  Urinalysis pending.  Blood cultures pending.  Chest x-ray without acute abnormality.  CT of the head without acute abnormality but does show sinus disease.  Patient received vancomycin and Zosyn in the ED as well as Tylenol.  She did have some improvement in her symptoms while in the ED as well.  Review of Systems: As per HPI otherwise all other systems reviewed and are negative.  Past Medical History:  Diagnosis Date   Hyperlipidemia    Hypertension    Hypothyroid     Past Surgical History:  Procedure Laterality Date   No prior surgery      Social History  reports that she quit smoking about 33 years ago. Her smoking use included cigarettes. She has never used smokeless tobacco. She reports current alcohol use. She reports that she does not use drugs.  Allergies   Allergen Reactions   Codeine Nausea Only   Penicillin G Rash    Other reaction(s): rash    Family History  Problem Relation Age of Onset   Stroke Mother    Thyroid disease Mother    Diabetes Father    Heart attack Father   Reviewed on admission  Prior to Admission medications   Medication Sig Start Date End Date Taking? Authorizing Provider  amLODipine (NORVASC) 5 MG tablet Take 5 mg by mouth daily.    [provider]  aspirin 81 MG tablet Take 81 mg by mouth daily.    [provider]  cholecalciferol (VITAMIN D) 1000 units tablet Take 1,000 Units by mouth 2 (two) times daily.    [provider]  ibuprofen (ADVIL,MOTRIN) 200 MG tablet Take 2 tablets ( 400 milligrams total) every 8 with food as needed for pain.  Take with food 10/18/18   Lajean Manes, MD  IBUPROFEN PO Take 200 mg by mouth as needed.    [provider]  losartan (COZAAR) 50 MG tablet Take 50 mg by mouth daily.    [provider]  Probiotic Product (PROBIOTIC DAILY PO) Take 1 tablet by mouth daily.    [provider]  simvastatin (ZOCOR) 20 MG tablet Take 20 mg by mouth daily.    [provider]  traMADol (ULTRAM) 50 MG tablet Take 50 mg by mouth every 6 (six) hours. 03/22/22   [provider]    Physical Exam: Vitals:   04/07/22 2115 04/07/22 2215 04/07/22 2230 04/07/22 2300  BP: (!) 142/74 134/61 (!) 121/52 (!) 142/59  Pulse: 96 95 81 86  Resp: (!) 23 (!) 22 (!) 24 (!) 22  Temp:      TempSrc:      SpO2: 91% 93% 94% 95%  Weight:      Height:        Physical Exam Constitutional:      General: She is not in acute distress.    Appearance: Normal appearance.  HENT:     Head: Normocephalic and atraumatic.     Mouth/Throat:     Mouth: Mucous membranes are moist.     Pharynx: Oropharynx is clear.  Eyes:     Extraocular Movements: Extraocular movements intact.     Pupils: Pupils are equal, round, and reactive to light.  Cardiovascular:      Rate and Rhythm: Normal rate and regular rhythm.     Pulses: Normal pulses.     Heart sounds: Normal heart sounds.  Pulmonary:     Effort: Pulmonary effort is normal. No respiratory distress.     Breath sounds: Normal breath sounds.  Abdominal:     General: Bowel sounds are normal. There is no distension.     Palpations: Abdomen is soft.     Tenderness: There is no abdominal tenderness.  Musculoskeletal:        General: No swelling or deformity.  Skin:    General: Skin is warm and dry.  Neurological:     General: No focal deficit present.     Mental Status: Mental status is at baseline.  ***  Labs on Admission: I have personally reviewed following labs and imaging studies  CBC: Recent Labs  Lab 04/07/22 2105  WBC 18.2*  NEUTROABS 15.8*  HGB 14.3  HCT 42.1  MCV 93.6  PLT 283    Basic Metabolic Panel: Recent Labs  Lab 04/07/22 2105  NA 140  K 3.3*  CL 104  CO2 27  GLUCOSE 136*  BUN 19  CREATININE 0.75  CALCIUM 9.3    GFR: Estimated Creatinine Clearance: 65.3 mL/min (by C-G formula based on SCr of 0.75 mg/dL).  Liver Function Tests: Recent Labs  Lab 04/07/22 2105  AST 25  ALT 17  ALKPHOS 101  BILITOT 1.3*  PROT 7.4  ALBUMIN 3.5    Urine analysis: No results found for: COLORURINE, APPEARANCEUR, LABSPEC, PHURINE, GLUCOSEU, HGBUR, BILIRUBINUR, KETONESUR, PROTEINUR, UROBILINOGEN, NITRITE, LEUKOCYTESUR  Radiological Exams on Admission: CT HEAD WO CONTRAST ( )  Result Date: 04/07/2022 CLINICAL DATA:  Mental status change, unknown cause. EXAM: CT HEAD WITHOUT CONTRAST TECHNIQUE: Contiguous axial images were obtained from the base of the skull through the vertex without intravenous contrast. RADIATION DOSE REDUCTION: This exam was performed according to the departmental dose-optimization program which includes automated exposure control, adjustment of the mA and/or kV according to patient size and/or use of iterative reconstruction technique. COMPARISON:   None Available. FINDINGS: Brain: There is mild cerebral atrophy, small-vessel disease and atrophic ventriculomegaly. The cerebellum and brainstem are unremarkable. There is no focal asymmetry worrisome for acute cortical based infarct, hemorrhage, mass effect or midline shift. Basal cisterns are patent. Vascular: There are patchy calcifications in the carotid siphons but no hyperdense central vasculature. Skull: No fracture or focal lesion. Sinuses/Orbits: There is mild membrane thickening in the paranasal sinuses without fluid levels. There has been a prior right lens extraction. There is patchy fluid in the lower right mastoid air cells. Remaining mastoid air cells are clear. Other: None. IMPRESSION: Atrophy and small-vessel disease  with no acute intracranial CT findings. Sinus and right mastoid disease as described above. Electronically Signed   By: Almira BarKeith  Chesser M.D.   On: 04/07/2022 21:56   DG Chest Port 1 View  Result Date: 04/07/2022 CLINICAL DATA:  Fever, vomiting, diarrhea, suspected sepsis and altered mental status. EXAM: PORTABLE CHEST 1 VIEW COMPARISON:  October 18, 2018 FINDINGS: The heart size and mediastinal contours are within normal limits. Both lungs are clear. The visualized skeletal structures are unremarkable. IMPRESSION: No active disease. Electronically Signed   By: Aram Candelahaddeus  Houston M.D.   On: 04/07/2022 21:36    EKG: Independently reviewed.  Sinus rhythm at 90 bpm.  Low voltage multiple leads.  Nonspecific T wave flattening in anterior leads.  Abnormal R wave progression.  T wave flattening is new from 2017.  Assessment/Plan Principal Problem:   SIRS (systemic inflammatory response syndrome) (HCC) Active Problems:   Essential hypertension   Hypothyroidism   Pure hypercholesterolemia   SIRS Encephalopathy > Patient presenting with concern for altered mentation when visiting with family today.  Also noted to have increase somnolence after taking Benadryl. > Had episodes of  nausea and vomiting as well noted to have fever in ED of 101.1.  Also noted to be tachypneic and have leukocytosis to 18.2.  Meeting SIRS criteria. > Source not yet identified as chest x-ray was clear and CT head showed sinus disease but nothing acute.  Urinalysis, urine cultures and blood cultures are pending. > Received vancomycin and Zosyn in the ED. > Initial lactic acid normal - Monitor on progressive unit for now - Trend lactic acid - Trend fever curve and white count - Follow-up urinalysis, urine culture, blood cultures - Switch antibiotics to vancomycin cefepime and Flagyl - 1 L fluid bolus, followed by further fluids overnight -  Check procalcitonin  Hypertension - Continue home amlodipine - Hold home losartan-hydrochlorothiazide combo in the setting of possible developing sepsis  *** DVT prophylaxis: Lovenox Code Status:   ***  Family Communication:  ***  Disposition Plan:   Patient is from:  Home  Anticipated DC to:  Home  Anticipated DC date:  1 to 3 days  Anticipated DC barriers: None  Consults called:  None Admission status:  Observation, progressive  Severity of Illness: The appropriate patient status for this patient is OBSERVATION. Observation status is judged to be reasonable and necessary in order to provide the required intensity of service to ensure the patient's safety. The patient's presenting symptoms, physical exam findings, and initial radiographic and laboratory data in the context of their medical condition is felt to place them at decreased risk for further clinical deterioration. Furthermore, it is anticipated that the patient will be medically stable for discharge from the hospital within 2 midnights of admission.    Synetta FailAlexander B Edison Nicholson MD [facility]  How to contact the [facility] Attending or Consulting provider 7A - 7P or covering provider during after hours 7P -7A, for this patient?   Check the care team in [facility] and look for a)  attending/consulting [facility] provider listed and b) the [facility] team listed Log into [email or web address] and use [facility]'s universal password to access. If you do not have the password, please contact the hospital operator. Locate the [facility] provider you are looking for under [facility] and page to a number that you can be directly reached. If you still have difficulty reaching the provider, please page the [facility] (Director on Call) for the Hospitalists listed on amion for assistance.  04/07/2022, 11:44  PM    

## 2022-04-07 NOTE — Progress Notes (Signed)
A consult was received from an ED physician for vancomcyin and zosyn per pharmacy dosing.  The patient's profile has been reviewed for ht/wt/allergies/indication/available labs.   A one time order has been placed for vancomycin 1gm and zosyn 3.375gm.    Further antibiotics/pharmacy consults should be ordered by admitting physician if indicated.                       Thank you,  Arley Phenix RPh 04/07/2022, 10:39 PM

## 2022-04-07 NOTE — ED Provider Notes (Signed)
[facility]-EMERGENCY DEPT Provider Note   CSN: [id number] Arrival date & time: 04/07/22  2046     History  Chief Complaint  Patient presents with   Fever    Jenny Krueger is a 76 y.o. female.  HPI Patient presents via EMS, subsequently joined by her daughter-in-law.  Patient is generally well, but today, while visiting with family members became altered.  Daughter notes that the patient has been suffering from sinus infections, may have taken Benadryl earlier in the day.  She slept an unusual amount of time, and upon awakening had nausea, vomiting, no pain.  She has improved somewhat since the most profound part of the illness, but states that she feels generally unwell, nauseous.  No focal pain.    Home Medications Prior to Admission medications   Medication Sig Start Date End Date Taking? Authorizing Provider  amLODipine (NORVASC) 5 MG tablet Take 5 mg by mouth daily.    [provider]  aspirin 81 MG tablet Take 81 mg by mouth daily.    [provider]  cholecalciferol (VITAMIN D) 1000 units tablet Take 1,000 Units by mouth 2 (two) times daily.    [provider]  ibuprofen (ADVIL,MOTRIN) 200 MG tablet Take 2 tablets ( 400 milligrams total) every 8 with food as needed for pain.  Take with food 10/18/18   Lajean ManesMassey, David, MD  IBUPROFEN PO Take 200 mg by mouth as needed.    [provider]  losartan (COZAAR) 50 MG tablet Take 50 mg by mouth daily.    [provider]  Probiotic Product (PROBIOTIC DAILY PO) Take 1 tablet by mouth daily.    [provider]  simvastatin (ZOCOR) 20 MG tablet Take 20 mg by mouth daily.    [provider]  traMADol (ULTRAM) 50 MG tablet Take 50 mg by mouth every 6 (six) hours. 03/22/22   [provider]      Allergies    Codeine and Penicillin g    Review of Systems   Review of Systems  All other systems reviewed and are negative.  Physical Exam Updated  Vital Signs BP (!) 142/59   Pulse 86   Temp (!) 101.1 F (38.4 C) (Rectal)   Resp (!) 22   Ht 5\' 5"  (1.651 m)   Wt 82 kg   SpO2 95%   BMI 30.08 kg/m  Physical Exam Vitals and nursing note reviewed.  Constitutional:      Appearance: She is well-developed. She is ill-appearing.  HENT:     Head: Normocephalic and atraumatic.  Eyes:     Conjunctiva/sclera: Conjunctivae normal.  Cardiovascular:     Rate and Rhythm: Regular rhythm. Tachycardia present.  Pulmonary:     Effort: Pulmonary effort is normal. No respiratory distress.     Breath sounds: Normal breath sounds. No stridor.  Abdominal:     General: There is no distension.  Skin:    General: Skin is warm and dry.  Neurological:     Mental Status: She is oriented to person, place, and time.     Cranial Nerves: No cranial nerve deficit.  Psychiatric:        Mood and Affect: Mood normal.    ED Results / Procedures / Treatments   Labs (all labs ordered are listed, but only abnormal results are displayed) Labs Reviewed  COMPREHENSIVE METABOLIC PANEL - Abnormal; Notable for the following components:      Result Value   Potassium 3.3 (*)  Glucose, Bld 136 (*)    Total Bilirubin 1.3 (*)    All other components within normal limits  CBC WITH DIFFERENTIAL/PLATELET - Abnormal; Notable for the following components:   WBC 18.2 (*)    Neutro Abs 15.8 (*)    Monocytes Absolute 1.4 (*)    Abs Immature Granulocytes 0.20 (*)    All other components within normal limits  SARS CORONAVIRUS 2 BY RT PCR  CULTURE, BLOOD (ROUTINE X 2)  CULTURE, BLOOD (ROUTINE X 2)  LACTIC ACID, PLASMA  PROTIME-INR  LACTIC ACID, PLASMA  URINALYSIS, ROUTINE W REFLEX MICROSCOPIC    EKG EKG Interpretation  Date/Time:  Thursday April 07 2022 22:21:19 EDT Ventricular Rate:  92 PR Interval:  141 QRS Duration: 96 QT Interval:  360 QTC Calculation: 446 R Axis:   -50 Text Interpretation: Sinus rhythm Probable left atrial enlargement Left anterior  fascicular block Abnormal R-wave progression, early transition Borderline T abnormalities, anterior leads Abnormal ECG Confirmed by Gerhard Munch [phone]) on 04/07/2022 11:07:46 PM  Radiology CT HEAD WO CONTRAST ( )  Result Date: 04/07/2022 CLINICAL DATA:  Mental status change, unknown cause. EXAM: CT HEAD WITHOUT CONTRAST TECHNIQUE: Contiguous axial images were obtained from the base of the skull through the vertex without intravenous contrast. RADIATION DOSE REDUCTION: This exam was performed according to the departmental dose-optimization program which includes automated exposure control, adjustment of the mA and/or kV according to patient size and/or use of iterative reconstruction technique. COMPARISON:  None Available. FINDINGS: Brain: There is mild cerebral atrophy, small-vessel disease and atrophic ventriculomegaly. The cerebellum and brainstem are unremarkable. There is no focal asymmetry worrisome for acute cortical based infarct, hemorrhage, mass effect or midline shift. Basal cisterns are patent. Vascular: There are patchy calcifications in the carotid siphons but no hyperdense central vasculature. Skull: No fracture or focal lesion. Sinuses/Orbits: There is mild membrane thickening in the paranasal sinuses without fluid levels. There has been a prior right lens extraction. There is patchy fluid in the lower right mastoid air cells. Remaining mastoid air cells are clear. Other: None. IMPRESSION: Atrophy and small-vessel disease with no acute intracranial CT findings. Sinus and right mastoid disease as described above. Electronically Signed   By: Almira Bar M.D.   On: 04/07/2022 21:56   DG Chest Port 1 View  Result Date: 04/07/2022 CLINICAL DATA:  Fever, vomiting, diarrhea, suspected sepsis and altered mental status. EXAM: PORTABLE CHEST 1 VIEW COMPARISON:  October 18, 2018 FINDINGS: The heart size and mediastinal contours are within normal limits. Both lungs are clear. The visualized  skeletal structures are unremarkable. IMPRESSION: No active disease. Electronically Signed   By: Aram Candela M.D.   On: 04/07/2022 21:36    Procedures Procedures    Medications Ordered in ED Medications  vancomycin (VANCOCIN) IVPB 1000 mg/200 mL premix (has no administration in time range)  ondansetron (ZOFRAN) injection 4 mg (4 mg Intravenous Given 04/07/22 2113)  acetaminophen (TYLENOL) tablet 1,000 mg (1,000 mg Oral Given 04/07/22 2247)  piperacillin-tazobactam (ZOSYN) IVPB 3.375 g (3.375 g Intravenous New Bag/Given 04/07/22 2248)    ED Course/ Medical Decision Making/ A&P This patient with a Hx of multiple medical issues including hypertension, reportedly frequent sinus infections presents to the ED for concern of altered mental status, nausea, is found to be febrile, this involves an extensive number of treatment options, and is a complaint that carries with it a high risk of complications and morbidity.    The differential diagnosis includes bacteremia, sepsis, intracranial abnormality, medication reaction  Social Determinants of Health:  Age  Additional history obtained:  Additional history and/or information obtained from daughter, notable for as above HPI   After the initial evaluation, orders, including: CT, labs, IV fluids, Zofran were initiated.   Patient placed on Cardiac and Pulse-Oximetry Monitors. The patient was maintained on a cardiac monitor.  The cardiac monitored showed an rhythm of 90 sinus normal The patient was also maintained on pulse oximetry. The readings were typically 100% room air normal   On repeat evaluation of the patient improved  Lab Tests:  I personally interpreted labs.  The pertinent results include: Leukocytosis, no lactic acidosis, COVID-negative  Imaging Studies ordered:  I independently visualized and interpreted imaging which showed acute intracranial abnormality, no pneumonia I agree with the radiologist  interpretation  Consultations Obtained:  I requested consultation with the internal medicine,  and discussed lab and imaging findings as well as pertinent plan - they recommend: Admission  Dispostion / Final MDM:  After consideration of the diagnostic results and the patient's response to treatment, patient be admitted. 11:43 PM Patient accompanied by 2 granddaughters, and I previously discussed findings with the patient's daughter.  This adult female generally well presents with new acute change in mentation, some suspicion for infection given fever, leukocytosis, and her history of sinusitis, but no obvious source on x-ray, CT, physical exam.  Patient received empiric antibiotics, had a substantial improvement here.  Though the patient did have nausea and vomiting, she had a soft, nontender abdomen on my exam, and denied abdominal pain.  Similarly, the patient had no urinary complaints when she was awake, urinalysis pending on admission.  With consideration of SIRS, altered mental status, the patient was admitted for further monitoring, management.  Final Clinical Impression(s) / ED Diagnoses Final diagnoses:  SIRS (systemic inflammatory response syndrome) (HCC)  Delirium  CRITICAL CARE Performed by: Gerhard Munch Total critical care time: 35 minutes Critical care time was exclusive of separately billable procedures and treating other patients. Critical care was necessary to treat or prevent imminent or life-threatening deterioration. Critical care was time spent personally by me on the following activities: development of treatment plan with patient and/or surrogate as well as nursing, discussions with consultants, evaluation of patient's response to treatment, examination of patient, obtaining history from patient or surrogate, ordering and performing treatments and interventions, ordering and review of laboratory studies, ordering and review of radiographic studies, pulse oximetry and  re-evaluation of patient's condition.    Gerhard Munch, MD 04/07/22 [phone]

## 2022-04-07 NOTE — ED Triage Notes (Signed)
Patient BIB [facility] from home. Called out for welfare check, she was not as acting like herself, altered than normal. Presenting with a new cough, hot to touch. Vomiting on arrival.

## 2022-04-08 ENCOUNTER — Observation Stay (HOSPITAL_COMMUNITY): Payer: Medicare Other

## 2022-04-08 DIAGNOSIS — I10 Essential (primary) hypertension: Secondary | ICD-10-CM | POA: Diagnosis not present

## 2022-04-08 DIAGNOSIS — J320 Chronic maxillary sinusitis: Secondary | ICD-10-CM

## 2022-04-08 DIAGNOSIS — R3989 Other symptoms and signs involving the genitourinary system: Secondary | ICD-10-CM

## 2022-04-08 DIAGNOSIS — A419 Sepsis, unspecified organism: Secondary | ICD-10-CM | POA: Diagnosis not present

## 2022-04-08 DIAGNOSIS — R112 Nausea with vomiting, unspecified: Secondary | ICD-10-CM

## 2022-04-08 LAB — BLOOD CULTURE ID PANEL (REFLEXED) - BCID2

## 2022-04-08 LAB — PROCALCITONIN: Procalcitonin: 1.43 ng/mL

## 2022-04-08 LAB — COMPREHENSIVE METABOLIC PANEL
ALT: 16 U/L (ref 0–44)
AST: 22 U/L (ref 15–41)
Albumin: 3.1 g/dL — ABNORMAL LOW (ref 3.5–5.0)
Alkaline Phosphatase: 86 U/L (ref 38–126)
Anion gap: 6 (ref 5–15)
BUN: 22 mg/dL (ref 8–23)
CO2: 28 mmol/L (ref 22–32)
Calcium: 8.8 mg/dL — ABNORMAL LOW (ref 8.9–10.3)
Chloride: 104 mmol/L (ref 98–111)
Creatinine, Ser: 0.66 mg/dL (ref 0.44–1.00)
GFR, Estimated: >60 mL/min (ref >60)
Glucose, Bld: 152 mg/dL — ABNORMAL HIGH (ref 70–99)
Potassium: 3.2 mmol/L — ABNORMAL LOW (ref 3.5–5.1)
Sodium: 138 mmol/L (ref 135–145)
Total Bilirubin: 1.3 mg/dL — ABNORMAL HIGH (ref 0.3–1.2)
Total Protein: 6.5 g/dL (ref 6.5–8.1)

## 2022-04-08 LAB — CBC
HCT: 37.7 % (ref 36.0–46.0)
Hemoglobin: 12.6 g/dL (ref 12.0–15.0)
MCH: 31.3 pg (ref 26.0–34.0)
MCHC: 33.4 g/dL (ref 30.0–36.0)
MCV: 93.8 fL (ref 80.0–100.0)
Platelets: 266 10*3/uL (ref 150–400)
RBC: 4.02 MIL/uL (ref 3.87–5.11)
RDW: 13 % (ref 11.5–15.5)
WBC: 18.8 10*3/uL — ABNORMAL HIGH (ref 4.0–10.5)
nRBC: 0 % (ref 0.0–0.2)

## 2022-04-08 LAB — URINALYSIS, ROUTINE W REFLEX MICROSCOPIC
Bilirubin Urine: NEGATIVE
Glucose, UA: NEGATIVE mg/dL
Hgb urine dipstick: NEGATIVE
Ketones, ur: NEGATIVE mg/dL
Nitrite: POSITIVE — AB
Protein, ur: 30 mg/dL — AB
Specific Gravity, Urine: 1.017 (ref 1.005–1.030)
WBC, UA: >50 WBC/hpf — ABNORMAL HIGH (ref 0–5)
pH: 7 (ref 5.0–8.0)

## 2022-04-08 LAB — CORTISOL-AM, BLOOD: Cortisol - AM: 34.7 ug/dL — ABNORMAL HIGH (ref 6.7–22.6)

## 2022-04-08 LAB — PROTIME-INR
INR: 1.2 (ref 0.8–1.2)
Prothrombin Time: 14.6 seconds (ref 11.4–15.2)

## 2022-04-08 IMAGING — MR MR CERVICAL SPINE W/O CM
5 of 6 series · 33 of 48 positions shown · non-contrast
Comparison: None Available.

CLINICAL DATA: Chronic neck pain.

EXAM:
MRI CERVICAL SPINE WITHOUT CONTRAST
TECHNIQUE: Multiplanar, multisequence MR imaging of the cervical spine was
performed. No intravenous contrast was administered.

[Series 5: T1 · sagittal · 3.0mm · 0.69mm/px · 5 of 15 slices shown (1 of 2)]
[im 1/15]
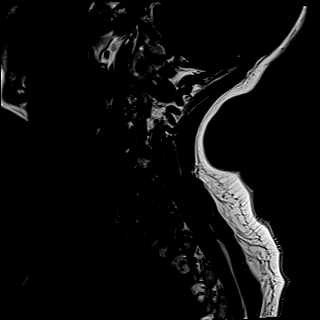
[im 4/15]
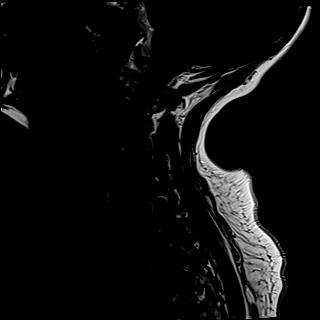
[im 8/15]
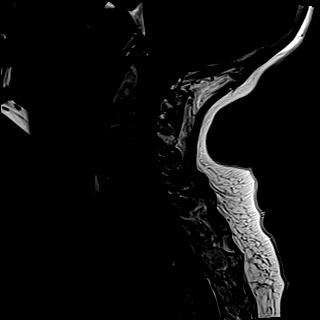
[im 11/15]
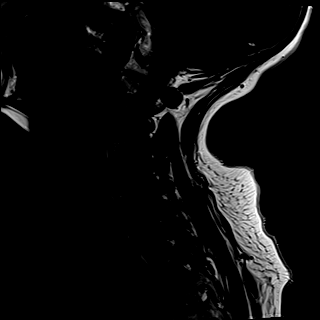
[im 15/15]
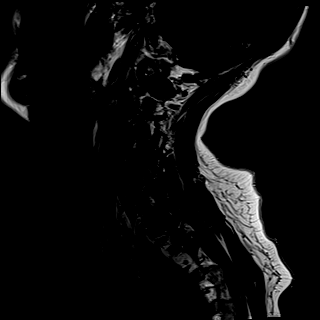

[Series 6: T2 · sagittal · 3.0mm · 0.69mm/px · 5 of 15 slices shown (1 of 2)]
[im 1/15]
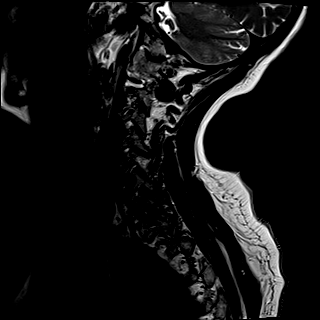
[im 4/15]
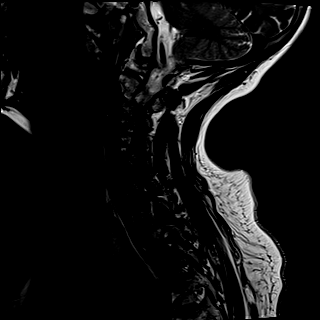
[im 8/15]
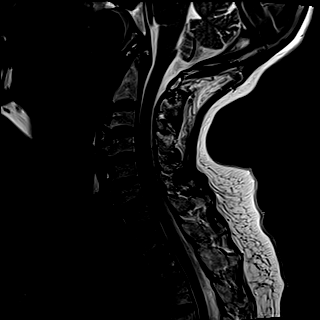
[im 11/15]
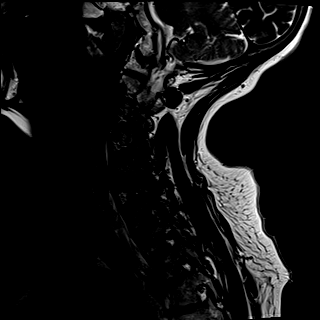
[im 15/15]
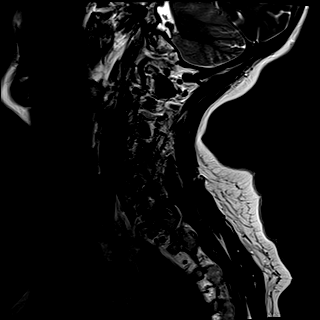

[Series 7: STIR · sagittal · 3.0mm · 0.86mm/px · 1 of 15 slices shown]
[im 1/15]
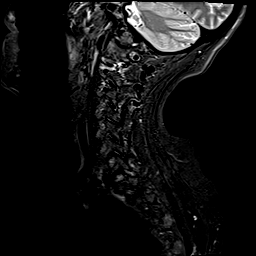

[Series 8: T2 · axial · 3.0mm · 0.70mm/px · z∈[-244,-140]mm · 11 of 31 slices shown (2 of 2)]
[im 1/31]
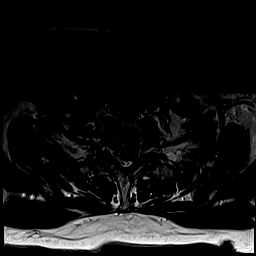
[im 4/31]
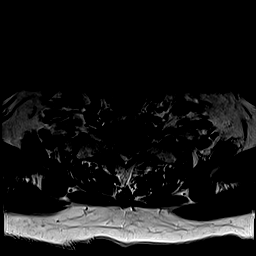
[im 7/31]
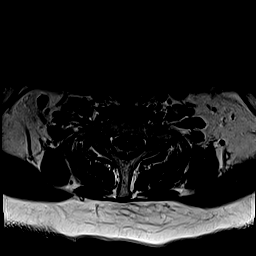
[im 10/31]
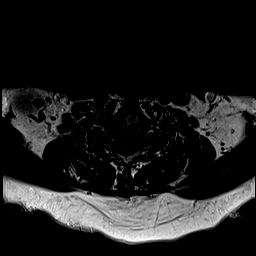
[im 13/31]
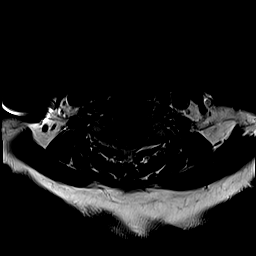
[im 16/31]
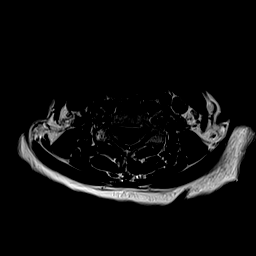
[im 19/31]
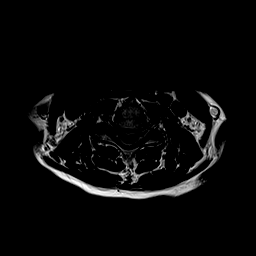
[im 22/31]
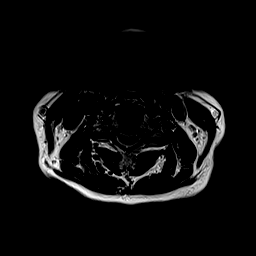
[im 25/31]
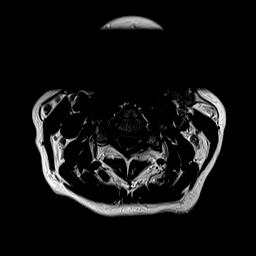
[im 28/31]
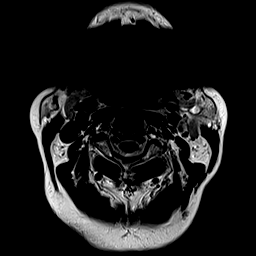
[im 31/31]
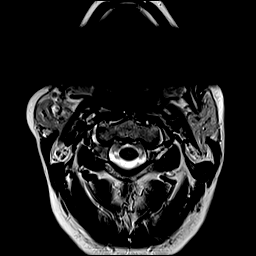

[Series 10: T1 · axial · 3.0mm · 0.35mm/px · z∈[-244,-140]mm · 11 of 31 slices shown (2 of 2)]
[im 1/31]
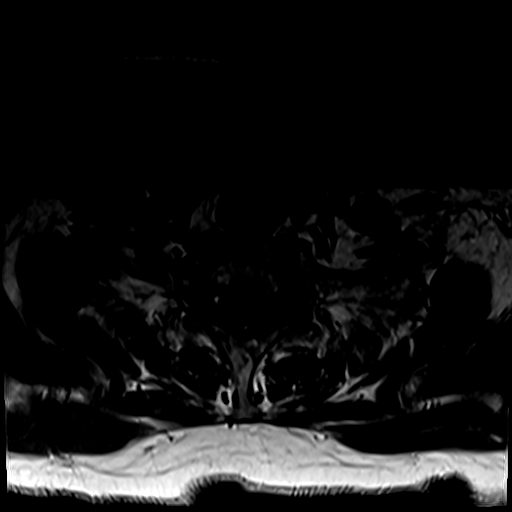
[im 4/31]
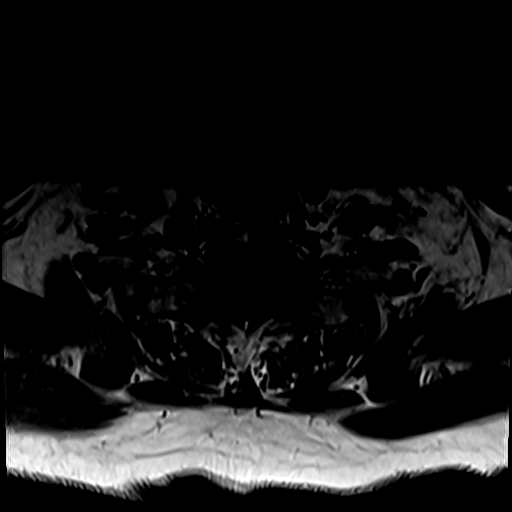
[im 7/31]
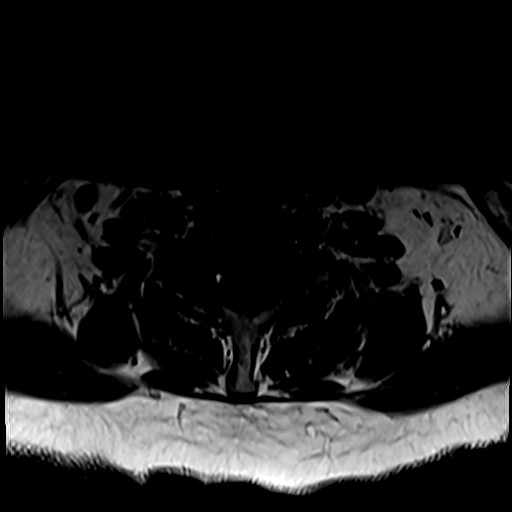
[im 10/31]
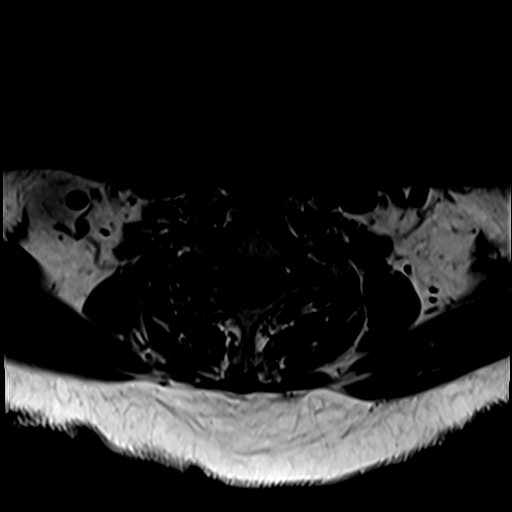
[im 13/31]
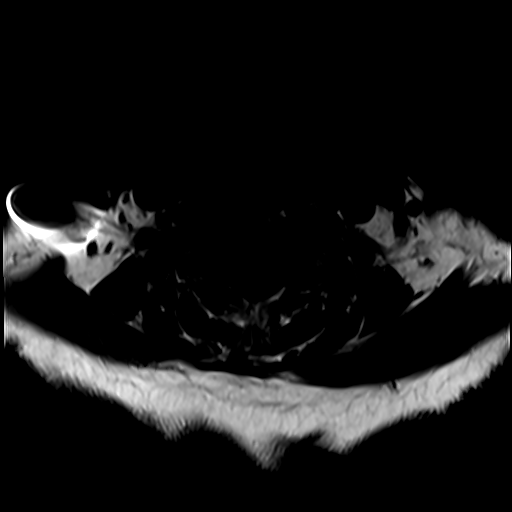
[im 16/31]
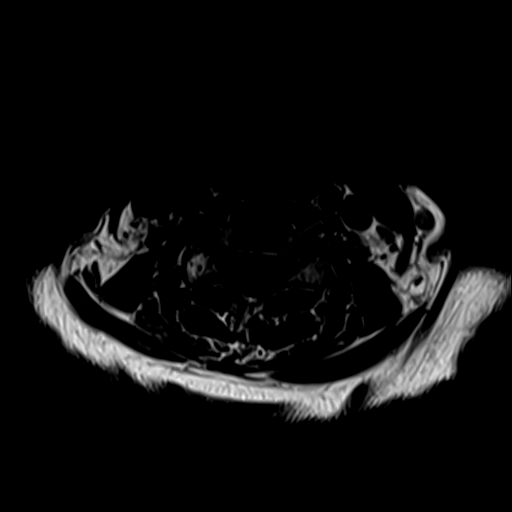
[im 19/31]
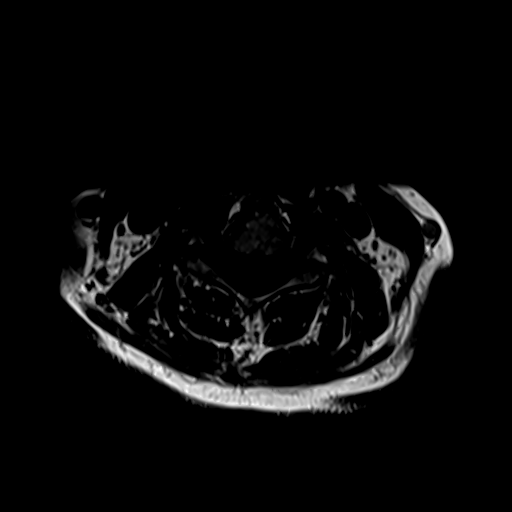
[im 22/31]
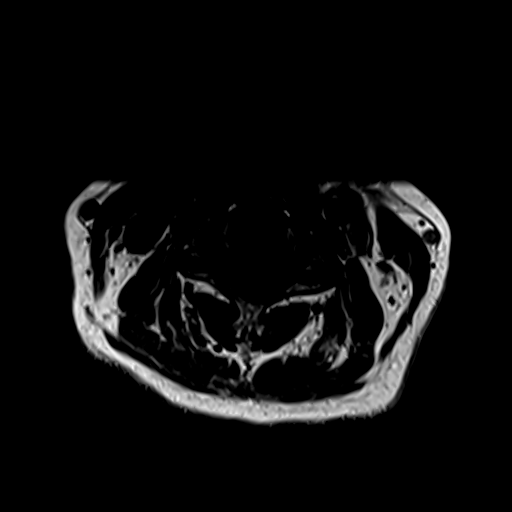
[im 25/31]
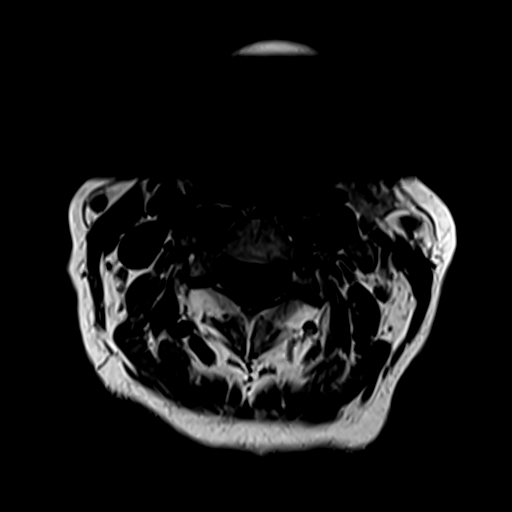
[im 28/31]
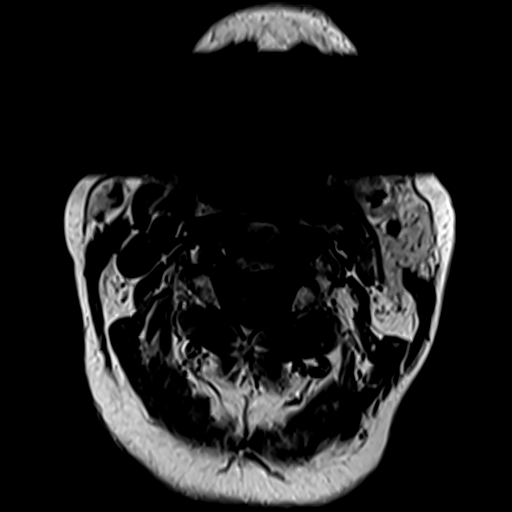
[im 31/31]
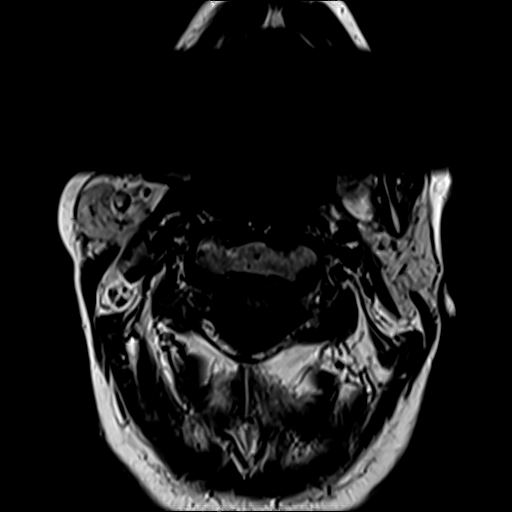

[33 of 48 positions shown; findings below may reference images not displayed]

FINDINGS: Alignment: Physiologic.

Vertebrae: No fracture, evidence of discitis, or bone lesion.

Cord: Normal signal and morphology.

Posterior Fossa, vertebral arteries, paraspinal tissues: Negative.

Disc levels:

C2-C3: Negative disc. Minimal facet uncovertebral hypertrophy. No
stenosis.

C3-C4: Tiny posterior disc osteophyte complex and mild bilateral
uncovertebral hypertrophy. Mild left neuroforaminal stenosis. No
spinal canal or right neuroforaminal stenosis.

C4-C5: Small posterior disc osteophyte complex. Mild right facet
uncovertebral hypertrophy. Mild spinal canal and right
neuroforaminal stenosis. No left neuroforaminal stenosis.

C5-C6: Small posterior disc osteophyte complex with superimposed
broad-based central disc protrusion. Mild bilateral uncovertebral
hypertrophy. Mild spinal canal stenosis. Moderate to severe
bilateral neuroforaminal stenosis.

C6-C7: Mild disc bulging with superimposed right foraminal disc
protrusion. Mild bilateral uncovertebral hypertrophy. Mild spinal
canal stenosis. Moderate to severe right and mild left
neuroforaminal stenosis.

C7-T1: Negative disc. Moderate right and mild left facet
arthropathy. No stenosis.
IMPRESSION: 1. Multilevel degenerative changes of the cervical spine as
described above. Moderate to severe neuroforaminal stenosis
bilaterally at C5-C6 and on the right at C6-C7.

## 2022-04-08 IMAGING — MR MR HEAD W/O CM
10 series · 48 of 48 positions shown · non-contrast
Comparison: CT head [DATE].

CLINICAL DATA: Mental status change, unknown cause Headache, new or
worsening (Age >= 50y) Sinusitis, rapid progression

EXAM:
MRI HEAD WITHOUT CONTRAST
TECHNIQUE: Multiplanar, multiecho pulse sequences of the brain and surrounding
structures were obtained without intravenous contrast.

[Series 5: DWI · axial · 3.0mm · 1.36mm/px · z∈[-56,+98]mm · 9 of 112 slices shown (1 of 2)]
[im 1/112]
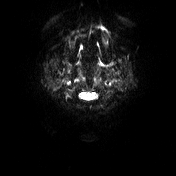
[im 14/112]
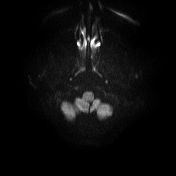
[im 28/112]
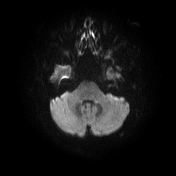
[im 42/112]
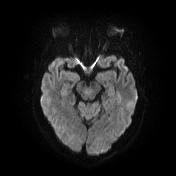
[im 56/112]
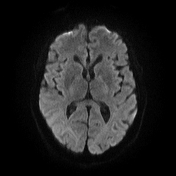
[im 70/112]
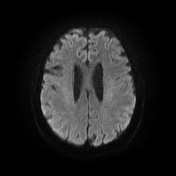
[im 84/112]
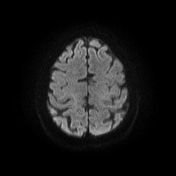
[im 98/112]
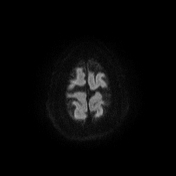
[im 112/112]
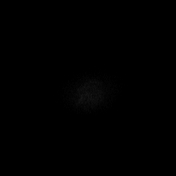

[Series 6: DWI · axial · 3.0mm · 1.36mm/px · z∈[-56,+98]mm · 4 of 56 slices shown (2 of 2)]
[im 1/56]
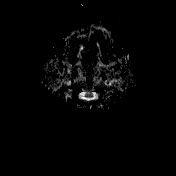
[im 19/56]
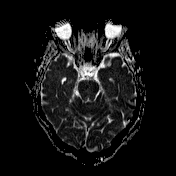
[im 37/56]
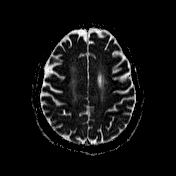
[im 56/56]
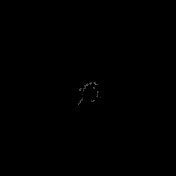

[Series 7: T1 · sagittal · 5.0mm · 0.75mm/px · 2 of 25 slices shown (1 of 2)]
[im 1/25]
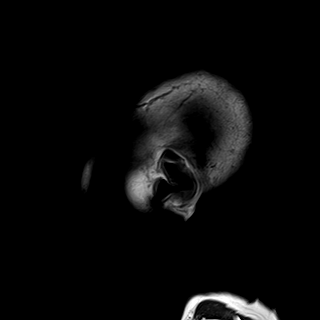
[im 25/25]
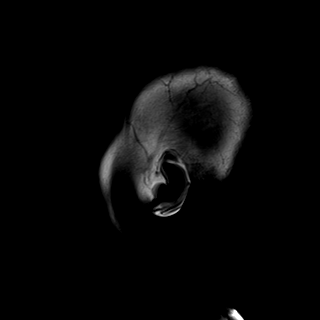

[Series 8: T2 · axial · 5.0mm · 0.62mm/px · z∈[-54,+97]mm · 2 of 26 slices shown (1 of 2)]
[im 1/26]
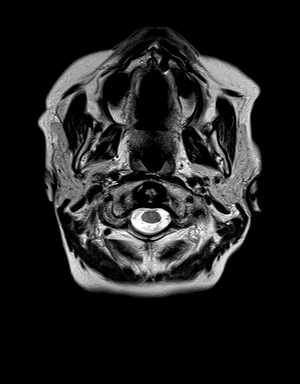
[im 26/26]
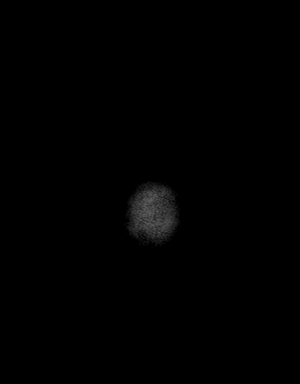

[Series 9: swi_images · axial · 3.0mm · 0.75mm/px · z∈[-55,+98]mm · 4 of 56 slices shown]
[im 1/56]
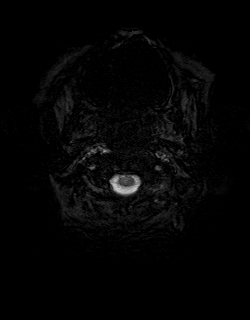
[im 19/56]
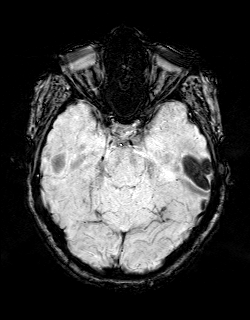
[im 37/56]
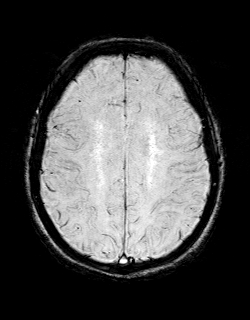
[im 56/56]
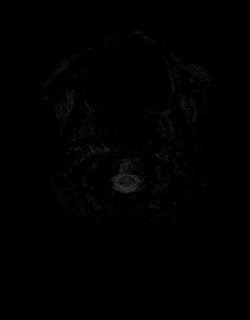

[Series 11: FLAIR · axial · 3.0mm · 0.75mm/px · z∈[-54,+97]mm · 4 of 55 slices shown]
[im 1/55]
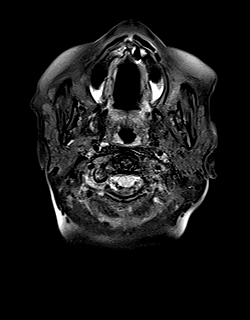
[im 19/55]
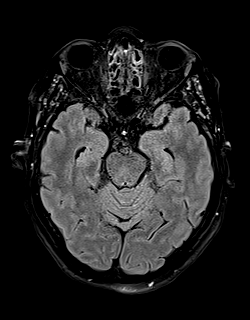
[im 37/55]
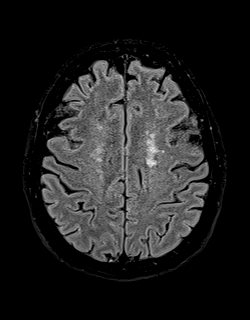
[im 55/55]
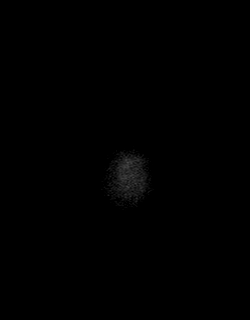

[Series 12: T1 · axial · 1.0mm · 0.94mm/px · z∈[-61,+102]mm · 13 of 176 slices shown (2 of 2)]
[im 1/176]
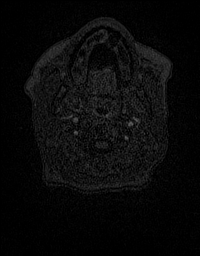
[im 15/176]
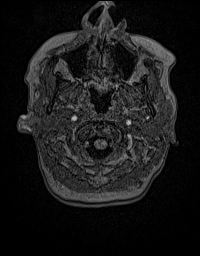
[im 30/176]
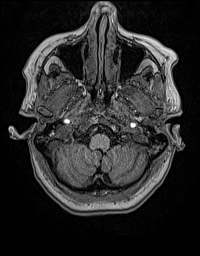
[im 44/176]
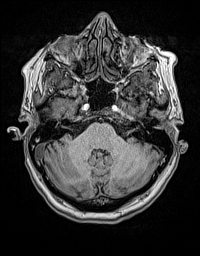
[im 59/176]
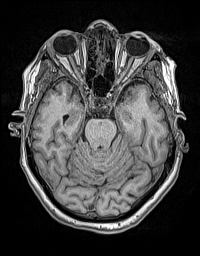
[im 73/176]
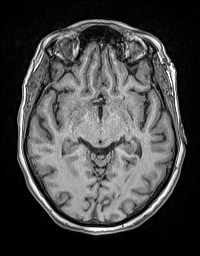
[im 88/176]
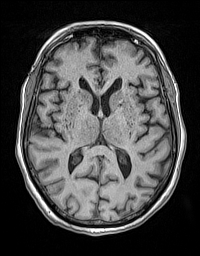
[im 103/176]
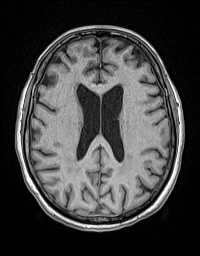
[im 117/176]
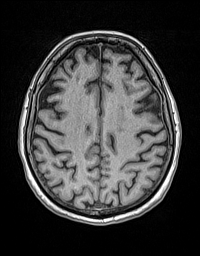
[im 132/176]
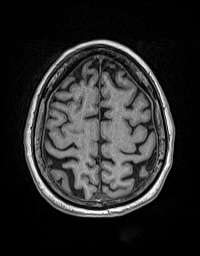
[im 146/176]
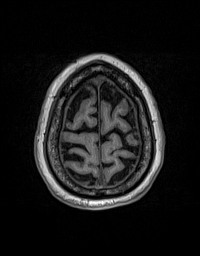
[im 161/176]
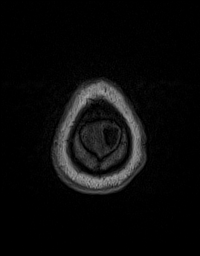
[im 176/176]
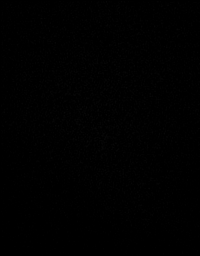

[Series 13: cor dwi_tracew · coronal · 5.0mm · 1.53mm/px · 5 of 62 slices shown]
[im 1/62]
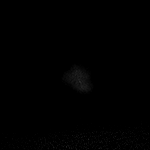
[im 16/62]
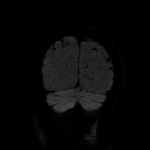
[im 31/62]
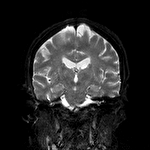
[im 46/62]
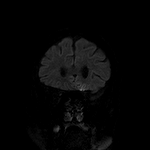
[im 62/62]
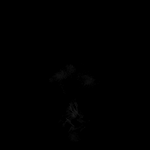

[Series 14: cor dwi_adc · coronal · 5.0mm · 1.53mm/px · 2 of 31 slices shown]
[im 1/31]
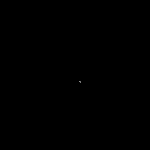
[im 31/31]
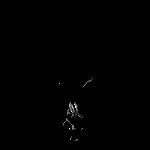

[Series 15: T2 · coronal · 5.0mm · 0.57mm/px · 3 of 39 slices shown (2 of 2)]
[im 1/39]
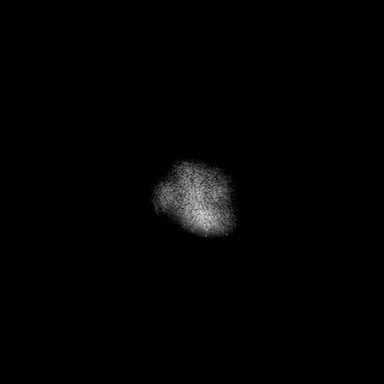
[im 20/39]
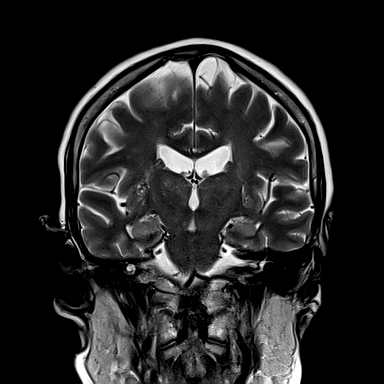
[im 39/39]
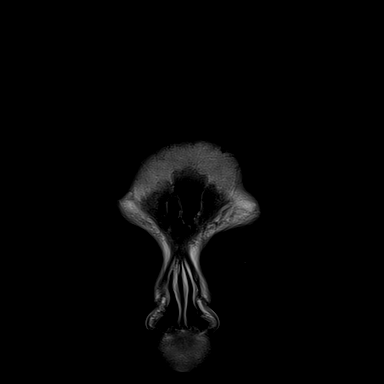

[48 of 48 positions shown; findings below may reference images not displayed]

FINDINGS: Brain: No acute infarction, hemorrhage, hydrocephalus, extra-axial
collection or mass lesion. Mild for age scattered T2/FLAIR
hyperintensities in the white matter, nonspecific but compatible
with chronic microvascular ischemic disease.

Vascular: Major arterial flow voids are maintained at the skull
base.

Skull and upper cervical spine: Normal marrow signal.

Sinuses/Orbits: Mild paranasal sinus mucosal thickening. No acute
orbital findings.

Other: No mastoid effusions.
IMPRESSION: No evidence of acute intracranial abnormality.

## 2022-04-08 MED ORDER — POTASSIUM CHLORIDE 10 MEQ/100ML IV SOLN
10.0000 meq | INTRAVENOUS | Status: AC
  Administered 2022-04-08 (×4): 10 meq via INTRAVENOUS
  Filled 2022-04-08 (×4): qty 100

## 2022-04-08 MED ORDER — METRONIDAZOLE 500 MG/100ML IV SOLN
500.0000 mg | Freq: Two times a day (BID) | INTRAVENOUS | Status: DC
  Administered 2022-04-08 (×2): 500 mg via INTRAVENOUS
  Filled 2022-04-08 (×2): qty 100

## 2022-04-08 MED ORDER — LORAZEPAM 2 MG/ML IJ SOLN
1.0000 mg | Freq: Once | INTRAMUSCULAR | Status: AC | PRN
  Administered 2022-04-08: 1 mg via INTRAVENOUS
  Filled 2022-04-08: qty 1

## 2022-04-08 MED ORDER — SODIUM CHLORIDE 0.9 % IV SOLN
2.0000 g | Freq: Three times a day (TID) | INTRAVENOUS | Status: DC
  Administered 2022-04-08 (×2): 2 g via INTRAVENOUS
  Filled 2022-04-08 (×2): qty 12.5

## 2022-04-08 MED ORDER — KETOROLAC TROMETHAMINE 15 MG/ML IJ SOLN
15.0000 mg | Freq: Once | INTRAMUSCULAR | Status: AC
  Administered 2022-04-08: 15 mg via INTRAVENOUS
  Filled 2022-04-08: qty 1

## 2022-04-08 MED ORDER — SODIUM CHLORIDE 0.9 % IV SOLN
2.0000 g | INTRAVENOUS | Status: DC
  Administered 2022-04-08 – 2022-04-10 (×2): 2 g via INTRAVENOUS
  Filled 2022-04-08 (×2): qty 20

## 2022-04-08 MED ORDER — ACETAMINOPHEN 325 MG PO TABS
325.0000 mg | ORAL_TABLET | Freq: Once | ORAL | Status: DC
Start: 2022-04-08 — End: 2022-04-14
  Filled 2022-04-08: qty 1

## 2022-04-08 MED ORDER — HYDROXYZINE HCL 25 MG PO TABS: 25.0000 mg | ORAL_TABLET | Freq: Once | ORAL | Status: DC

## 2022-04-08 MED ORDER — PROCHLORPERAZINE EDISYLATE 10 MG/2ML IJ SOLN
10.0000 mg | Freq: Four times a day (QID) | INTRAMUSCULAR | Status: DC | PRN
  Administered 2022-04-08 – 2022-04-09 (×2): 10 mg via INTRAVENOUS
  Filled 2022-04-08 (×2): qty 2

## 2022-04-08 MED ORDER — SODIUM CHLORIDE 0.9 % IV SOLN: 12.5000 mg | Freq: Four times a day (QID) | INTRAVENOUS | Status: DC | PRN

## 2022-04-08 MED ORDER — VANCOMYCIN HCL IN DEXTROSE 1-5 GM/200ML-% IV SOLN: 1000.0000 mg | INTRAVENOUS | Status: DC

## 2022-04-08 NOTE — ED Notes (Signed)
Pt transported to MRI 

## 2022-04-08 NOTE — Progress Notes (Signed)
PHARMACY - PHYSICIAN COMMUNICATION CRITICAL VALUE ALERT - BLOOD CULTURE IDENTIFICATION (BCID)  Jenny Krueger is an 76 y.o. female who presented to [facility] on 04/07/2022 with a chief complaint of sepsis 2/2 UTI and acute sinusitis  Assessment:  2/3 blood culture bottles with Gram neg coccobacilli. BCID with H. influenzae  Name of physician (or Provider) Contacted: Dr. Toniann Fail  Current antibiotics: vancomycin, cefepime, and flagyl  Changes to prescribed antibiotics recommended:  Discontinue vancomycin and flagyl. Narrow cefepime to ceftriaxone 2g IV q24h.   Results for orders placed or performed during the hospital encounter of 04/07/22  Blood Culture ID Panel (Reflexed) (Collected: 04/07/2022  9:05 PM)  Result Value Ref Range   Enterococcus faecalis NOT DETECTED NOT DETECTED   Enterococcus Faecium NOT DETECTED NOT DETECTED   Listeria monocytogenes NOT DETECTED NOT DETECTED   Staphylococcus species NOT DETECTED NOT DETECTED   Staphylococcus aureus (BCID) NOT DETECTED NOT DETECTED   Staphylococcus epidermidis NOT DETECTED NOT DETECTED   Staphylococcus lugdunensis NOT DETECTED NOT DETECTED   Streptococcus species NOT DETECTED NOT DETECTED   Streptococcus agalactiae NOT DETECTED NOT DETECTED   Streptococcus pneumoniae NOT DETECTED NOT DETECTED   Streptococcus pyogenes NOT DETECTED NOT DETECTED   A.calcoaceticus-baumannii NOT DETECTED NOT DETECTED   Bacteroides fragilis NOT DETECTED NOT DETECTED   Enterobacterales NOT DETECTED NOT DETECTED   Enterobacter cloacae complex NOT DETECTED NOT DETECTED   Escherichia coli NOT DETECTED NOT DETECTED   Klebsiella aerogenes NOT DETECTED NOT DETECTED   Klebsiella oxytoca NOT DETECTED NOT DETECTED   Klebsiella pneumoniae NOT DETECTED NOT DETECTED   Proteus species NOT DETECTED NOT DETECTED   Salmonella species NOT DETECTED NOT DETECTED   Serratia marcescens NOT DETECTED NOT DETECTED   Haemophilus influenzae DETECTED (A) NOT DETECTED    Neisseria meningitidis NOT DETECTED NOT DETECTED   Pseudomonas aeruginosa NOT DETECTED NOT DETECTED   Stenotrophomonas maltophilia NOT DETECTED NOT DETECTED   Candida albicans NOT DETECTED NOT DETECTED   Candida auris NOT DETECTED NOT DETECTED   Candida glabrata NOT DETECTED NOT DETECTED   Candida krusei NOT DETECTED NOT DETECTED   Candida parapsilosis NOT DETECTED NOT DETECTED   Candida tropicalis NOT DETECTED NOT DETECTED   Cryptococcus neoformans/gattii NOT DETECTED NOT DETECTED    Rexford Maus, PharmD 04/08/2022 7:54 PM

## 2022-04-08 NOTE — Plan of Care (Signed)
  Problem: Education: Goal: Knowledge of General Education information will improve Description: Including pain rating scale, medication(s)/side effects and non-pharmacologic comfort measures Outcome: Progressing   Problem: Clinical Measurements: Goal: Ability to maintain clinical measurements within normal limits will improve Outcome: Progressing Goal: Will remain free from infection Outcome: Progressing Goal: Diagnostic test results will improve Outcome: Progressing   Problem: Activity: Goal: Risk for activity intolerance will decrease Outcome: Progressing   Problem: Pain Managment: Goal: General experience of comfort will improve Outcome: Progressing   Problem: Safety: Goal: Ability to remain free from injury will improve Outcome: Progressing   Problem: Skin Integrity: Goal: Risk for impaired skin integrity will decrease Outcome: Progressing   

## 2022-04-08 NOTE — ED Notes (Addendum)
Patient is actively vomiting and reports severe right sided head and neck pain. MD paged.

## 2022-04-08 NOTE — Progress Notes (Signed)
Pharmacy Antibiotic Note  Jenny Krueger is a 76 y.o. female admitted on 04/07/2022 with medical history significant of hypertension, hyperlipidemia, hypothyroidism, degenerative disc disease, fibromyalgia presenting with altered mental status..  Pharmacy has been consulted to dose vancomycin and cefepime for sepsis.  PT received vanc 1gm and zosyn 3.375gm in the ED  Plan: Vancomycin 1gm IV q24h (AUC 483.2, used Scr 0.8) Cefepime 2gm IV q8h Flagyl 500mg  IV q12h per MD Follow renal function, cultures and clinical course  Height: 5\' 5"  (165.1 cm) Weight: 82 kg (180 lb 12.4 oz) IBW/kg (Calculated) : 57  Temp (24hrs), Avg:101.1 F (38.4 C), Min:101.1 F (38.4 C), Max:101.1 F (38.4 C)  Recent Labs  Lab 04/07/22 2105  WBC 18.2*  CREATININE 0.75  LATICACIDVEN 1.9    Estimated Creatinine Clearance: 65.3 mL/min (by C-G formula based on SCr of 0.75 mg/dL).    Allergies  Allergen Reactions   Codeine Nausea Only   Penicillin G Rash    Other reaction(s): rash    Antimicrobials this admission: 6/1 vanc >> 6/1 zosyn x 1 6/2 cefepime >> 6/2 flagyl >>  Dose adjustments this admission:   Microbiology results: 6/1 BCx:  6/1 UCx:     Thank you for allowing pharmacy to be a part of this patient's care.  8/1 RPh 04/08/2022, 12:23 AM

## 2022-04-08 NOTE — Progress Notes (Signed)
PROGRESS NOTE    Jenny BLANDO  TLX:[id number] DOB: 08/15/1946 DOA: 04/07/2022 PCP: Juluis Rainier, MD (Inactive)   Brief Narrative:  HPI per Dr. Beola Cord on 04/07/22 Jenny Krueger is a 76 y.o. female with medical history significant of hypertension, hyperlipidemia, hypothyroidism, degenerative disc disease, fibromyalgia presenting with altered mental status.  History obtained with assistance of chart review and family.  Patient had been seemingly at baseline but was noted to be altered today when visiting with family.  Has had recent sinus infections.  Earlier today was noted to have increased somnolence after taking Benadryl.  Upon waking did have episode of nausea and vomiting. Also reports some intermittent fevers, and headaches.   Denies chest pain, shortness of breath, abdominal pain, constipation, diarrhea   ED Course: Vital signs in the ED notable for fever to 101.1, tachypnea in the 20s, blood pressure in the 130s to 140s systolic.  Lab work-up included CMP with potassium 3.3, glucose 136, T. bili 1.3.  CBC with leukocytosis to 18.2.  PT and INR within normal limits.  Lactic acid normal with repeat pending.  COVID negative.  Urinalysis pending.  Blood cultures pending.  Chest x-ray without acute abnormality.  CT of the head without acute abnormality but does show sinus disease.  Patient received vancomycin and Zosyn in the ED as well as Tylenol.  She did have some improvement in her symptoms while in the ED as well.   **Interim History  Continues to have a headache and some neck pain.  Also had some nausea and vomiting and was given Compazine.  Asking for some Aleve for her head.  States that she gets headaches whenever she gets a sinus infection.  Urinalysis shows likely she has a urinary tract infection but she remains on broad-spectrum antibiotics for now and will de-escalate once cultures are resulted.  Assessment and Plan:  Sepsis 2/2 to suspected UTI and Acute  Sinusitis  Encephalopathy -Patient presenting with concern for altered mentation when visiting with family today.   -Also noted to have increase somnolence after taking Benadryl. Per family, mentation has improved in ED. -Had episodes of nausea and vomiting as well noted to have fever in ED of 101.1.  Also noted to be tachypneic and have leukocytosis to 18.2.  Meeting SIRS criteria. -Source not yet identified as chest x-ray was clear and CT head showed sinus disease but nothing acute.   -Urinalysis done and showed cloudy appearance with moderate leukocytes, positive nitrites, many bacteria, 11-20 RBCs per high-power field greater than 50 WBCs with urine culture pending -Blood cultures x2 showed no growth to date at 12 days -She has some neck tenderness but no true nuchal rigidity and could be related to her severe neuroforaminal stenosis. >Received vancomycin and Zosyn in the ED. > Initial lactic acid normal at 1.9 -Continue to monitor on progressive unit for now -Continue Trend fever curve and white count and WBC went from 18.2 is now 18.8 - Follow-up urinalysis, urine culture, blood cultures -Switched antibiotics to vancomycin cefepime and Flagyl and will continue for now -Received 1000 g of acetaminophen last night and is now on 650 mg p.o. every 6 as needed for mild pain or fever greater than 100 - 1 L fluid bolus and now on maintenance IV fluids with LR at 100 mL/hr  -Checked Procalcitonin and is now 1.43 -Continue to monitor and trend and follow clinical course  Acute Headache associated with Neck Pain -Does not really have any nuchal rigidity but continues  to complain of posterior pain in her neck as well as head -States that she gets headaches when she gets sinus infections and is worse on the right side -Head CT w/o Contrast showed "Atrophy and small-vessel disease with no acute intracranial CT findings. Sinus and right mastoid disease as described above" -MRI Brain w/o Contrast done  and showed "No evidence of acute intracranial abnormality" and MRI of the Cervical Spine showed Multilevel degenerative changes of the cervical spine as described above. 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 bili is now 1.3 -Continue to monitor and trend and continue with IV fluid hydration -Repeat CMP in a.m.  Hypertension -Continue home Amlodipine -Holding home Losartan-Hydrochlorothiazide combo in the setting of  sepsis -Continue monitor blood pressures per protocol -Last blood pressure reading was elevated at 152/67  Obesity -Complicates overall prognosis and care -Estimated body mass index is 30.08 kg/m as calculated from the following:   Height as of this encounter:  (1.651 m).   Weight as of this encounter: 82 kg.  -Weight Loss and Dietary Counseling given  DVT prophylaxis: enoxaparin (LOVENOX) injection 40 mg Start: 04/08/22 1000    Code Status: Full Code Family Communication: Discussed with daughter at bedside  Disposition Plan:  Level of care: Progressive Status is: Observation The patient will require care spanning > 2 midnights and should be moved to inpatient because: Continues to be nauseous and vomiting and she is improving but slowly.  We will need to tolerate a diet prior to discharge   Consultants:  None  Procedures:  Head CT, MRI of the brain and C-spine  Antimicrobials:  Anti-infectives (From admission, onward)    Start     Dose/Rate Route Frequency Ordered Stop   04/08/22 2200  vancomycin (VANCOCIN) IVPB 1000 mg/200 mL premix        1,000 mg 200 mL/hr over 60 Minutes Intravenous Every 24 hours 04/08/22 0024     04/08/22 0600  ceFEPIme (MAXIPIME) 2 g in sodium chloride 0.9 % 100 mL  IVPB        2 g 200 mL/hr over 30 Minutes Intravenous Every 8 hours 04/08/22 0024     04/08/22 0015  metroNIDAZOLE (FLAGYL) IVPB 500 mg        500 mg 100 mL/hr over 60 Minutes Intravenous Every 12 hours 04/08/22 0008     04/07/22 2230  piperacillin-tazobactam (ZOSYN) IVPB 3.375 g        3.375 g 100 mL/hr over 30 Minutes Intravenous  Once 04/07/22 2224 04/07/22 2318   04/07/22 2230  vancomycin (VANCOCIN) IVPB 1000 mg/200 mL premix        1,000 mg 200 mL/hr over 60 Minutes Intravenous  Once 04/07/22 2224 04/08/22 0049       Subjective: Seen and examined at bedside and she is still complaining of pain in her head and neck and right-sided neck pain.  Also having some slight facial pain and is extremely nauseous and was vomiting earlier but feels a little bit better.  Denies any burning or discomfort in her urine.  No chest pain or shortness of breath.  No other concerns or complaints at this time.  Objective: Vitals:   04/08/22 0815 04/08/22 0846 04/08/22 0900 04/08/22 1156  BP: Marland Kitchen)  144/71  (!) 129/91 (!) 148/69  Pulse: 87  81 92  Resp: (!) 21  (!) 28 (!) 23  Temp:  98.8 F (37.1 C)    TempSrc:  Oral    SpO2: 96%  96% 98%  Weight:      Height:        Intake/Output Summary (Last 24 hours) at 04/08/2022 1344 Last data filed at 04/08/2022 0950 Gross per 24 hour  Intake 1550 ml  Output --  Net 1550 ml   Filed Weights   04/07/22 2055  Weight: 82 kg   Examination: Physical Exam:  Constitutional: WN/WD obese slightly ill-appearing Caucasian female currently in mild distress Respiratory: Diminished to auscultation bilaterally, no wheezing, rales, rhonchi or crackles. Normal respiratory effort and patient is not tachypenic. No accessory muscle use.  Unlabored breathing Cardiovascular: RRR, no murmurs / rubs / gallops. S1 and S2 auscultated.  Abdomen: Soft, non-tender, distended secondary body habitus.  Bowel sounds positive.  GU: Deferred. Musculoskeletal: No clubbing / cyanosis of  digits/nails. No joint deformity upper and lower extremities. Skin: No rashes, lesions, ulcers. No induration; Warm and dry.  Neurologic: CN 2-12 grossly intact with no focal deficits. Romberg sign cerebellar reflexes not assessed.  Psychiatric: Normal judgment and insight. Alert and oriented x 3.  Anxious appearing mood  Data Reviewed: I have personally reviewed following labs and imaging studies  CBC: Recent Labs  Lab 04/07/22 2105 04/08/22 0557  WBC 18.2* 18.8*  NEUTROABS 15.8*  --   HGB 14.3 12.6  HCT 42.1 37.7  MCV 93.6 93.8  PLT 283 266   Basic Metabolic Panel: Recent Labs  Lab 04/07/22 2105 04/08/22 0557  NA 140 138  K 3.3* 3.2*  CL 104 104  CO2 27 28  GLUCOSE 136* 152*  BUN 19 22  CREATININE 0.75 0.66  CALCIUM 9.3 8.8*   GFR: Estimated Creatinine Clearance: 65.3 mL/min (by C-G formula based on SCr of 0.66 mg/dL). Liver Function Tests: Recent Labs  Lab 04/07/22 2105 04/08/22 0557  AST 25 22  ALT 17 16  ALKPHOS 101 86  BILITOT 1.3* 1.3*  PROT 7.4 6.5  ALBUMIN 3.5 3.1*   No results for input(s): LIPASE, AMYLASE in the last 168 hours. No results for input(s): AMMONIA in the last 168 hours. Coagulation Profile: Recent Labs  Lab 04/07/22 2105 04/08/22 0557  INR 1.0 1.2   Cardiac Enzymes: No results for input(s): CKTOTAL, CKMB, CKMBINDEX, TROPONINI in the last 168 hours. BNP (last 3 results) No results for input(s): PROBNP in the last 8760 hours. HbA1C: No results for input(s): HGBA1C in the last 72 hours. CBG: No results for input(s): GLUCAP in the last 168 hours. Lipid Profile: No results for input(s): CHOL, HDL, LDLCALC, TRIG, CHOLHDL, LDLDIRECT in the last 72 hours. Thyroid Function Tests: No results for input(s): TSH, T4TOTAL, FREET4, T3FREE, THYROIDAB in the last 72 hours. Anemia Panel: No results for input(s): VITAMINB12, FOLATE, FERRITIN, TIBC, IRON, RETICCTPCT in the last 72 hours. Sepsis Labs: Recent Labs  Lab 04/07/22 2105  04/08/22 0557  PROCALCITON  --  1.43  LATICACIDVEN 1.9  --     Recent Results (from the past 240 hour(s))  SARS Coronavirus 2 by RT PCR (hospital order, performed in [facility] hospital lab) *cepheid single result test*     Status: None   Collection Time: 04/07/22  9:02 PM   Specimen: Nasal Swab  Result Value Ref Range Status   SARS Coronavirus 2 by RT PCR NEGATIVE NEGATIVE Final  Comment: (NOTE) SARS-CoV-2 target nucleic acids are NOT DETECTED.  The SARS-CoV-2 RNA is generally detectable in upper and lower respiratory specimens during the acute phase of infection. The lowest concentration of SARS-CoV-2 viral copies this assay can detect is 250 copies / mL. A negative result does not preclude SARS-CoV-2 infection and should not be used as the sole basis for treatment or other patient management decisions.  A negative result may occur with improper specimen collection / handling, submission of specimen other than nasopharyngeal swab, presence of viral mutation(s) within the areas targeted by this assay, and inadequate number of viral copies (<250 copies / mL). A negative result must be combined with clinical observations, patient history, and epidemiological information.  Fact Sheet for Patients:   RoadLapTop.co.zahttps://www.fda.gov/media/158405/download  Fact Sheet for Healthcare Providers: http://kim-miller.com/https://www.fda.gov/media/158404/download  This test is not yet approved or  cleared by the [place] FDA and has been authorized for detection and/or diagnosis of SARS-CoV-2 by FDA under an Emergency Use Authorization (EUA).  This EUA will remain in effect (meaning this test can be used) for the duration of the COVID-19 declaration under Section 564(b)(1) of the Act, 21 U.S.C. section 360bbb-3(b)(1), unless the authorization is terminated or revoked sooner.  Performed at [facility], [address] [place], [place] [address]   Culture, blood (Routine x 2)     Status: None  (Preliminary result)   Collection Time: 04/07/22  9:05 PM   Specimen: BLOOD  Result Value Ref Range Status   Specimen Description   Final    BLOOD LEFT ANTECUBITAL Performed at [facility], [address] [place], [place] [address]    Special Requests   Final    BOTTLES DRAWN AEROBIC AND ANAEROBIC Blood Culture adequate volume Performed at [facility], [address] [place], [place] [address]    Culture   Final    NO GROWTH < 12 HOURS Performed at [facility], [address] [place], [place] [address]    Report Status PENDING  Incomplete  Culture, blood (Routine x 2)     Status: None (Preliminary result)   Collection Time: 04/07/22  9:16 PM   Specimen: BLOOD  Result Value Ref Range Status   Specimen Description   Final    BLOOD BLOOD LEFT HAND Performed at [facility], [address] [place], [place] [address]    Special Requests   Final    BOTTLES DRAWN AEROBIC ONLY Blood Culture results may not be optimal due to an inadequate volume of blood received in culture bottles Performed at [facility], [address] [place], [place] [address]    Culture   Final    NO GROWTH < 12 HOURS Performed at [facility], [address] [place], [place] [address]    Report Status PENDING  Incomplete    Radiology Studies: CT HEAD WO CONTRAST (5MM)  Result Date: 04/07/2022 CLINICAL DATA:  Mental status change, unknown cause. EXAM: CT HEAD WITHOUT CONTRAST TECHNIQUE: Contiguous axial images were obtained from the base of the skull through the vertex without intravenous contrast. RADIATION DOSE REDUCTION: This exam was performed according to the departmental dose-optimization program which includes automated exposure control, adjustment of the mA and/or kV according to patient size and/or use of iterative reconstruction technique. COMPARISON:  None Available. FINDINGS: Brain: There is mild cerebral  atrophy, small-vessel disease and atrophic ventriculomegaly. The cerebellum and brainstem are unremarkable. There is no focal asymmetry worrisome  for acute cortical based infarct, hemorrhage, mass effect or midline shift. Basal cisterns are patent. Vascular: There are patchy calcifications in the carotid siphons but no hyperdense central vasculature. Skull: No fracture or focal lesion. Sinuses/Orbits: There is mild membrane thickening in the paranasal sinuses without fluid levels. There has been a prior right lens extraction. There is patchy fluid in the lower right mastoid air cells. Remaining mastoid air cells are clear. Other: None. IMPRESSION: Atrophy and small-vessel disease with no acute intracranial CT findings. Sinus and right mastoid disease as described above. Electronically Signed   By: Almira Bar M.D.   On: 04/07/2022 21:56   MR BRAIN WO CONTRAST  Result Date: 04/08/2022 CLINICAL DATA:  Mental status change, unknown cause Headache, new or worsening (Age >= 50y) Sinusitis, rapid progression EXAM: MRI HEAD WITHOUT CONTRAST TECHNIQUE: Multiplanar, multiecho pulse sequences of the brain and surrounding structures were obtained without intravenous contrast. COMPARISON:  CT head April 07, 2022. FINDINGS: Brain: No acute infarction, hemorrhage, hydrocephalus, extra-axial collection or mass lesion. Mild for age scattered T2/FLAIR hyperintensities in the white matter, nonspecific but compatible with chronic microvascular ischemic disease. Vascular: Major arterial flow voids are maintained at the skull base. Skull and upper cervical spine: Normal marrow signal. Sinuses/Orbits: Mild paranasal sinus mucosal thickening. No acute orbital findings. Other: No mastoid effusions. IMPRESSION: No evidence of acute intracranial abnormality. Electronically Signed   By: Feliberto Harts M.D.   On: 04/08/2022 11:36   MR CERVICAL SPINE WO CONTRAST  Result Date: 04/08/2022 CLINICAL DATA:  Chronic neck pain. EXAM: MRI  CERVICAL SPINE WITHOUT CONTRAST TECHNIQUE: Multiplanar, multisequence MR imaging of the cervical spine was performed. No intravenous contrast was administered. COMPARISON:  None Available. FINDINGS: Alignment: Physiologic. Vertebrae: No fracture, evidence of discitis, or bone lesion. Cord: Normal signal and morphology. Posterior Fossa, vertebral arteries, paraspinal tissues: Negative. Disc levels: C2-C3: Negative disc. Minimal facet uncovertebral hypertrophy. No stenosis. C3-C4: Tiny posterior disc osteophyte complex and mild bilateral uncovertebral hypertrophy. Mild left neuroforaminal stenosis. No spinal canal or right neuroforaminal stenosis. C4-C5: Small posterior disc osteophyte complex. Mild right facet uncovertebral hypertrophy. Mild spinal canal and right neuroforaminal stenosis. No left neuroforaminal stenosis. C5-C6: Small posterior disc osteophyte complex with superimposed broad-based central disc protrusion. Mild bilateral uncovertebral hypertrophy. Mild spinal canal stenosis. Moderate to severe bilateral neuroforaminal stenosis. C6-C7: Mild disc bulging with superimposed right foraminal disc protrusion. Mild bilateral uncovertebral hypertrophy. Mild spinal canal stenosis. Moderate to severe right and mild left neuroforaminal stenosis. C7-T1: Negative disc. Moderate right and mild left facet arthropathy. No stenosis. IMPRESSION: 1. Multilevel degenerative changes of the cervical spine as described above. 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 Electronically Signed   By: Obie Dredge M.D.   On: 04/08/2022 12:00   DG Chest Port 1 View  Result Date: 04/07/2022 CLINICAL DATA:  Fever, vomiting, diarrhea, suspected sepsis and altered mental status. EXAM: PORTABLE CHEST 1 VIEW COMPARISON:  October 18, 2018 FINDINGS: The heart size and mediastinal contours are within normal limits. Both lungs are clear. The visualized skeletal structures are  unremarkable. IMPRESSION: No active disease. Electronically Signed   By: Aram Candela M.D.   On: 04/07/2022 21:36    Scheduled Meds:  amLODipine  5 mg Oral Daily   enoxaparin (LOVENOX) injection  40 mg Subcutaneous Q24H   sodium chloride flush  3 mL Intravenous Q12H   Continuous Infusions:  ceFEPime (MAXIPIME) IV Stopped (04/08/22 0622)   lactated ringers 100 mL/hr at 04/08/22  1159   metronidazole 500 mg (04/08/22 1234)   promethazine (PHENERGAN) injection (IM or IVPB)     vancomycin      LOS: 0 days   Marguerita Merles, DO [facility] Available via Epic secure chat 7am-7pm After these hours, please refer to coverage provider listed on [email or web address] 04/08/2022, 1:44 PM

## 2022-04-09 DIAGNOSIS — R410 Disorientation, unspecified: Secondary | ICD-10-CM | POA: Diagnosis present

## 2022-04-09 DIAGNOSIS — Z833 Family history of diabetes mellitus: Secondary | ICD-10-CM | POA: Diagnosis not present

## 2022-04-09 DIAGNOSIS — R6520 Severe sepsis without septic shock: Secondary | ICD-10-CM

## 2022-04-09 DIAGNOSIS — R6510 Systemic inflammatory response syndrome (SIRS) of non-infectious origin without acute organ dysfunction: Secondary | ICD-10-CM | POA: Diagnosis not present

## 2022-04-09 DIAGNOSIS — Z87891 Personal history of nicotine dependence: Secondary | ICD-10-CM | POA: Diagnosis not present

## 2022-04-09 DIAGNOSIS — B961 Klebsiella pneumoniae [K. pneumoniae] as the cause of diseases classified elsewhere: Secondary | ICD-10-CM | POA: Diagnosis present

## 2022-04-09 DIAGNOSIS — N390 Urinary tract infection, site not specified: Secondary | ICD-10-CM | POA: Diagnosis present

## 2022-04-09 DIAGNOSIS — Z8349 Family history of other endocrine, nutritional and metabolic diseases: Secondary | ICD-10-CM | POA: Diagnosis not present

## 2022-04-09 DIAGNOSIS — I10 Essential (primary) hypertension: Secondary | ICD-10-CM | POA: Diagnosis present

## 2022-04-09 DIAGNOSIS — A4159 Other Gram-negative sepsis: Secondary | ICD-10-CM | POA: Diagnosis present

## 2022-04-09 DIAGNOSIS — E669 Obesity, unspecified: Secondary | ICD-10-CM | POA: Diagnosis present

## 2022-04-09 DIAGNOSIS — Z823 Family history of stroke: Secondary | ICD-10-CM | POA: Diagnosis not present

## 2022-04-09 DIAGNOSIS — G9341 Metabolic encephalopathy: Secondary | ICD-10-CM | POA: Diagnosis present

## 2022-04-09 DIAGNOSIS — Z885 Allergy status to narcotic agent status: Secondary | ICD-10-CM | POA: Diagnosis not present

## 2022-04-09 DIAGNOSIS — J019 Acute sinusitis, unspecified: Secondary | ICD-10-CM | POA: Diagnosis present

## 2022-04-09 DIAGNOSIS — Z880 Allergy status to penicillin: Secondary | ICD-10-CM | POA: Diagnosis not present

## 2022-04-09 DIAGNOSIS — A419 Sepsis, unspecified organism: Secondary | ICD-10-CM | POA: Diagnosis not present

## 2022-04-09 DIAGNOSIS — Z7982 Long term (current) use of aspirin: Secondary | ICD-10-CM | POA: Diagnosis not present

## 2022-04-09 DIAGNOSIS — Z6833 Body mass index (BMI) 33.0-33.9, adult: Secondary | ICD-10-CM | POA: Diagnosis not present

## 2022-04-09 DIAGNOSIS — E039 Hypothyroidism, unspecified: Secondary | ICD-10-CM | POA: Diagnosis present

## 2022-04-09 DIAGNOSIS — A413 Sepsis due to Hemophilus influenzae: Secondary | ICD-10-CM

## 2022-04-09 DIAGNOSIS — Z20822 Contact with and (suspected) exposure to covid-19: Secondary | ICD-10-CM | POA: Diagnosis present

## 2022-04-09 DIAGNOSIS — Z79899 Other long term (current) drug therapy: Secondary | ICD-10-CM | POA: Diagnosis not present

## 2022-04-09 DIAGNOSIS — M797 Fibromyalgia: Secondary | ICD-10-CM | POA: Diagnosis present

## 2022-04-09 DIAGNOSIS — E876 Hypokalemia: Secondary | ICD-10-CM | POA: Diagnosis present

## 2022-04-09 DIAGNOSIS — Z8249 Family history of ischemic heart disease and other diseases of the circulatory system: Secondary | ICD-10-CM | POA: Diagnosis not present

## 2022-04-09 DIAGNOSIS — G9340 Encephalopathy, unspecified: Secondary | ICD-10-CM

## 2022-04-09 DIAGNOSIS — G000 Hemophilus meningitis: Secondary | ICD-10-CM | POA: Diagnosis present

## 2022-04-09 DIAGNOSIS — E7800 Pure hypercholesterolemia, unspecified: Secondary | ICD-10-CM | POA: Diagnosis present

## 2022-04-09 LAB — CSF CELL COUNT WITH DIFFERENTIAL
Eosinophils, CSF: 0 % (ref 0–1)
Eosinophils, CSF: 0 % (ref 0–1)
Lymphs, CSF: 0 % — ABNORMAL LOW (ref 40–80)
Lymphs, CSF: 0 % — ABNORMAL LOW (ref 40–80)
Monocyte-Macrophage-Spinal Fluid: 37 % (ref 15–45)
Monocyte-Macrophage-Spinal Fluid: 50 % — ABNORMAL HIGH (ref 15–45)
RBC Count, CSF: 1260 /mm3 — ABNORMAL HIGH
RBC Count, CSF: 21 /mm3 — ABNORMAL HIGH
Segmented Neutrophils-CSF: 50 % — ABNORMAL HIGH (ref 0–6)
Segmented Neutrophils-CSF: 63 % — ABNORMAL HIGH (ref 0–6)
Tube #: 1
Tube #: 4
WBC, CSF: 107 /mm3 (ref 0–5)
WBC, CSF: 195 /mm3 (ref 0–5)

## 2022-04-09 LAB — COMPREHENSIVE METABOLIC PANEL
ALT: 16 U/L (ref 0–44)
AST: 17 U/L (ref 15–41)
Albumin: 3 g/dL — ABNORMAL LOW (ref 3.5–5.0)
Alkaline Phosphatase: 91 U/L (ref 38–126)
Anion gap: 7 (ref 5–15)
BUN: 16 mg/dL (ref 8–23)
CO2: 26 mmol/L (ref 22–32)
Calcium: 9.4 mg/dL (ref 8.9–10.3)
Chloride: 105 mmol/L (ref 98–111)
Creatinine, Ser: 0.51 mg/dL (ref 0.44–1.00)
GFR, Estimated: >60 mL/min (ref >60)
Glucose, Bld: 121 mg/dL — ABNORMAL HIGH (ref 70–99)
Potassium: 3.6 mmol/L (ref 3.5–5.1)
Sodium: 138 mmol/L (ref 135–145)
Total Bilirubin: 1 mg/dL (ref 0.3–1.2)
Total Protein: 6.7 g/dL (ref 6.5–8.1)

## 2022-04-09 LAB — CBC WITH DIFFERENTIAL/PLATELET
Abs Immature Granulocytes: 0.13 10*3/uL — ABNORMAL HIGH (ref 0.00–0.07)
Basophils Absolute: 0.1 10*3/uL (ref 0.0–0.1)
Basophils Relative: 0 %
Eosinophils Absolute: 0 10*3/uL (ref 0.0–0.5)
Eosinophils Relative: 0 %
HCT: 36.4 % (ref 36.0–46.0)
Hemoglobin: 12.2 g/dL (ref 12.0–15.0)
Immature Granulocytes: 1 %
Lymphocytes Relative: 10 %
Lymphs Abs: 1.6 10*3/uL (ref 0.7–4.0)
MCH: 31.9 pg (ref 26.0–34.0)
MCHC: 33.5 g/dL (ref 30.0–36.0)
MCV: 95.3 fL (ref 80.0–100.0)
Monocytes Absolute: 0.9 10*3/uL (ref 0.1–1.0)
Monocytes Relative: 6 %
Neutro Abs: 13 10*3/uL — ABNORMAL HIGH (ref 1.7–7.7)
Neutrophils Relative %: 83 %
Platelets: 251 10*3/uL (ref 150–400)
RBC: 3.82 MIL/uL — ABNORMAL LOW (ref 3.87–5.11)
RDW: 13.1 % (ref 11.5–15.5)
WBC: 15.7 10*3/uL — ABNORMAL HIGH (ref 4.0–10.5)
nRBC: 0 % (ref 0.0–0.2)

## 2022-04-09 LAB — GLUCOSE, CAPILLARY: Glucose-Capillary: 88 mg/dL (ref 70–99)

## 2022-04-09 LAB — MAGNESIUM: Magnesium: 2.2 mg/dL (ref 1.7–2.4)

## 2022-04-09 LAB — PROTEIN AND GLUCOSE, CSF
Glucose, CSF: 21 mg/dL — CL (ref 40–70)
Total  Protein, CSF: 110 mg/dL — ABNORMAL HIGH (ref 15–45)

## 2022-04-09 LAB — PHOSPHORUS: Phosphorus: 2.1 mg/dL — ABNORMAL LOW (ref 2.5–4.6)

## 2022-04-09 MED ORDER — HYDROCORTISONE 1 % EX CREA
TOPICAL_CREAM | Freq: Three times a day (TID) | CUTANEOUS | Status: DC
Start: 2022-04-09 — End: 2022-04-14
  Filled 2022-04-09 (×2): qty 28

## 2022-04-09 MED ORDER — LIDOCAINE-EPINEPHRINE (PF) 1 %-1:200000 IJ SOLN
10.0000 mL | Freq: Once | INTRAMUSCULAR | Status: AC
  Administered 2022-04-09: 10 mL
  Filled 2022-04-09: qty 10

## 2022-04-09 MED ORDER — LIDOCAINE-EPINEPHRINE 2 %-1:50000 IJ SOLN
1.7000 mL | Freq: Once | INTRAMUSCULAR | Status: DC
  Filled 2022-04-09: qty 1.7

## 2022-04-09 MED ORDER — LIDOCAINE-EPINEPHRINE 2 %-1:100000 IJ SOLN
1.7000 mL | Freq: Once | INTRAMUSCULAR | Status: DC
Start: 2022-04-09 — End: 2022-04-09

## 2022-04-09 MED ORDER — K PHOS MONO-SOD PHOS DI & MONO 155-852-130 MG PO TABS
500.0000 mg | ORAL_TABLET | Freq: Two times a day (BID) | ORAL | Status: AC
  Administered 2022-04-09 (×2): 500 mg via ORAL
  Filled 2022-04-09 (×2): qty 2

## 2022-04-09 NOTE — Plan of Care (Signed)
Unable to visit with patient as LP was in process:   Assessment: 76 year old female with fibromyalgia, hypertension admitted with altered mental status.  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  ID engaged for concern of meningitis.  #H influenza bacteremia #Blood cultures positive for Klebsiella pneumonia Altered mental status with fevers and leukocytosis --CT head showed no acute abnormalities. -Patient has had recent sinus infection and.  woke up with nausea/vomiting +cough, headaches and fevers. LP is warranted, given concern for H. influenzae meningitis. Recommendations:  -Continue CTX 2gm q12h -Agree with LP, follow up CSF studies -Follow up blood Cx and Urine Cx Microbiology:   Antibiotics: Cefepime 6/1 - 6/2 Ceftriaxone//2-present Metronidazole 6/1 - 2/2 PIP Tazo 6/1 Vancomycin 6/1 Cultures: Blood 6/12/2 haemophilus influenza,  beta-lactamase negative Urine 6/1 greater than 100,000 colonies of Klebsiella pneumonia Other

## 2022-04-09 NOTE — Progress Notes (Signed)
PROGRESS NOTE    Jenny Krueger  ZOX:[id number] DOB: Jun 10, 1946 DOA: 04/07/2022 PCP: Juluis Rainier, MD (Inactive)   Brief Narrative:  HPI per Dr. Beola Cord on 04/07/22 Jenny Krueger is a 76 y.o. female with medical history significant of hypertension, hyperlipidemia, hypothyroidism, degenerative disc disease, fibromyalgia presenting with altered mental status.  History obtained with assistance of chart review and family.  Patient had been seemingly at baseline but was noted to be altered today when visiting with family.  Has had recent sinus infections.  Earlier today was noted to have increased somnolence after taking Benadryl.  Upon waking did have episode of nausea and vomiting. Also reports some intermittent fevers, and headaches.   Denies chest pain, shortness of breath, abdominal pain, constipation, diarrhea   ED Course: Vital signs in the ED notable for fever to 101.1, tachypnea in the 20s, blood pressure in the 130s to 140s systolic.  Lab work-up included CMP with potassium 3.3, glucose 136, T. bili 1.3.  CBC with leukocytosis to 18.2.  PT and INR within normal limits.  Lactic acid normal with repeat pending.  COVID negative.  Urinalysis pending.  Blood cultures pending.  Chest x-ray without acute abnormality.  CT of the head without acute abnormality but does show sinus disease.  Patient received vancomycin and Zosyn in the ED as well as Tylenol.  She did have some improvement in her symptoms while in the ED as well.   **Interim History  Continues to have a headache and some neck pain.  Also had some nausea and vomiting and was given Compazine.  Asking for some Aleve for her head.  States that she gets headaches whenever she gets a sinus infection.  Urinalysis shows likely she has a urinary tract infection but she remains on broad-spectrum antibiotics for now and will de-escalate once cultures are resulted.  Assessment and Plan:  Sepsis 2/2 to suspected Klebsiella UTI  and Acute Sinusitis now with H Influenzae Bacteremia  Encephalopathy in the setting of Sepsis with concern for H Influenzae Meningitis  -Patient presenting with concern for altered mentation when visiting with family today.   -Also noted to have increase somnolence after taking Benadryl. Per family, mentation has improved in ED. -Had episodes of nausea and vomiting as well noted to have fever in ED of 101.1.  Also noted to be tachypneic and have leukocytosis to 18.2.  Meeting SIRS criteria. -Source now likley 2/2 to H Influenzae Bacteremia and Klebseilla PNA as chest x-ray was clear and CT head showed sinus disease but nothing acute.   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 >Initial lactic acid normal at 1.9 -Continue to monitor on progressive unit for now -Continue Trend fever curve and white count and WBC went from 18.2 is now 18.8 -> 15.7 - Follow-up urinalysis, urine culture, blood cultures -Switched antibiotics to vancomycin cefepime and Flagyl and will continue for now -Received 1000 g of acetaminophen last night and is now on 650 mg p.o. every 6 as needed for mild pain or fever greater than 100 - 1 L fluid bolus  and now on maintenance IV fluids with LR at 100 mL/hr  -Checked Procalcitonin and is now 1.43 -ID consulted for further evaluation and recc's and recommending obtaining LP; Discussed with Neuro and they recommending IR to do it under Fluoro. IR wants it attempted on the Floor prior to them attempting. IR and Neuro to discuss  with themselves but regardless LP needs to be done per ID Recc's  -Continue to monitor and trend and follow clinical course   Acute Headache associated with Neck Pain -Does not really have any nuchal rigidity but continues to complain of posterior pain in her neck as well as head and now that she has H Influenzae Bacteremia it is concerning for Meningitis  -States that she gets headaches when she gets sinus infections and is worse on the right side -Head CT w/o Contrast showed "Atrophy and small-vessel disease with no acute intracranial CT findings. Sinus and right mastoid disease as described above" -MRI Brain w/o Contrast done and showed "No evidence of acute intracranial abnormality" and MRI of the Cervical Spine showed Multilevel degenerative changes of the cervical spine as described above. 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 bili is now 1.3 -> 1.0 -Continue to monitor and trend and continue with IV fluid hydration -Repeat CMP in a.m.  Hypertension -Continue home Amlodipine -Holding home Losartan-Hydrochlorothiazide combo in the setting of  sepsis -Continue monitor blood pressures per protocol -Last blood pressure reading was elevated at 134/79  Hypokalemia -Mild and in the setting of nausea vomiting -Potassium is improved to 1 from 3.2 is now three-point -Continue to monitor and replete as necessary -Repeat CMP in a.m.  Hypophosphatemia -Patient's Phos level was 2.1 -Replete with p.o. K-Phos Neutral 500 g p.o. twice daily x2 doses -Continue to monitor and replete as necessary -Repeat Phos in a.m.    Obesity -Complicates overall prognosis and care -Estimated body mass index is 33.16 kg/m as calculated from the following:   Height as of this encounter: 5\' 5"  (1.651 m).   Weight as of this encounter: 90.4 kg.  -Weight Loss and Dietary Counseling given  DVT prophylaxis: enoxaparin (LOVENOX) injection 40 mg Start: 04/08/22 1000    Code Status: Full Code Family Communication: Discussed with Son at bedside   Disposition Plan:  Level of care: Progressive Status is: Observation The patient will require care spanning > 2 midnights and should be moved to inpatient because: Has an H Influenzae Bacteremia   Consultants:  ID Neuro IR  Procedures:  Needs LP done   Antimicrobials:  Anti-infectives (From admission, onward)    Start     Dose/Rate Route Frequency Ordered Stop   04/08/22 2200  vancomycin (VANCOCIN) IVPB 1000 mg/200 mL premix  Status:  Discontinued        1,000 mg 200 mL/hr over 60 Minutes Intravenous Every 24 hours 04/08/22 0024 04/08/22 2011   04/08/22 2200  cefTRIAXone (ROCEPHIN) 2 g in sodium chloride 0.9 % 100 mL IVPB        2 g 200 mL/hr over 30 Minutes Intravenous Every 24 hours 04/08/22 2011     04/08/22 0600  ceFEPIme (MAXIPIME)  2 g in sodium chloride 0.9 % 100 mL IVPB  Status:  Discontinued        2 g 200 mL/hr over 30 Minutes Intravenous Every 8 hours 04/08/22 0024 04/08/22 2011   04/08/22 0015  metroNIDAZOLE (FLAGYL) IVPB 500 mg  Status:  Discontinued        500 mg 100 mL/hr over 60 Minutes Intravenous Every 12 hours 04/08/22 0008 04/08/22 2011   04/07/22 2230  piperacillin-tazobactam (ZOSYN) IVPB 3.375 g        3.375 g 100 mL/hr over 30 Minutes Intravenous  Once 04/07/22 2224 04/07/22 2318   04/07/22 2230  vancomycin (VANCOCIN) IVPB 1000 mg/200 mL premix        1,000 mg 200 mL/hr over 60 Minutes Intravenous  Once 04/07/22 2224 04/08/22 0049       Subjective: Seen and examined at bedside she is still having a headache.  States that she threw up this  morning.  Thinks she is doing a little bit better now.  Denies any chest pain.  No other concerns or complaints at this time.  Objective: Vitals:   04/08/22 2200 04/09/22 0200 04/09/22 0400 04/09/22 1356  BP: 116/61 (!) 128/49 (!) 156/68 134/79  Pulse: 74 80 81   Resp: Temp: 98.2 F (36.8 C) 98.4 F (36.9 C) 98.7 F (37.1 C)   TempSrc: Oral Oral Oral   SpO2: 100% 98% 99%   Weight:   90.4 kg   Height:        Intake/Output Summary (Last 24 hours) at 04/09/2022 1428 Last data filed at 04/09/2022 1610 Gross per 24 hour  Intake 2219.8 ml  Output 800 ml  Net 1419.8 ml   Filed Weights   04/07/22 2055 04/09/22 0400  Weight: 82 kg 90.4 kg   Examination: Physical Exam:  Constitutional: WN/WD obese Caucasian female currently no acute distress appears calmer and not as nauseous but still having a headache Respiratory: Diminished to auscultation bilaterally, no wheezing, rales, rhonchi or crackles. Normal respiratory effort and patient is not tachypenic. No accessory muscle use.  Unlabored breathing Cardiovascular: RRR, no murmurs / rubs / gallops. S1 and S2 auscultated.  Mild lower extremity edema Abdomen: Soft, non-tender, distended second by habitus bowel sounds positive.  GU: Deferred. Musculoskeletal: No clubbing / cyanosis of digits/nails. No joint deformity upper and lower extremities.  Neurologic: CN 2-12 grossly intact with no focal deficits. Romberg sign and cerebellar reflexes not assessed.  Psychiatric: Normal judgment and insight. Alert and oriented x 3. Normal mood and appropriate affect.   Data Reviewed: I have personally reviewed following labs and imaging studies  CBC: Recent Labs  Lab 04/07/22 2105 04/08/22 0557 04/09/22 0914  WBC 18.2* 18.8* 15.7*  NEUTROABS 15.8*  --  13.0*  HGB 14.3 12.6 12.2  HCT 42.1 37.7 36.4  MCV 93.6 93.8 95.3  PLT 283 266 251   Basic Metabolic Panel: Recent Labs  Lab 04/07/22 2105 04/08/22 0557 04/09/22 0914  NA 140  138 138  K 3.3* 3.2* 3.6  CL 104 104 105  CO2 GLUCOSE 136* 152* 121*  BUN CREATININE 0.75 0.66 0.51  CALCIUM 9.3 8.8* 9.4  MG  --   --  2.2  PHOS  --   --  2.1*   GFR: Estimated Creatinine Clearance: 68.6 mL/min (by C-G formula based on SCr of 0.51 mg/dL). Liver Function Tests: Recent Labs  Lab 04/07/22 2105 04/08/22 0557 04/09/22 9604  AST ALT ALKPHOS 101 86 91  BILITOT 1.3* 1.3* 1.0  PROT 7.4 6.5 6.7  ALBUMIN 3.5 3.1* 3.0*   No results for input(s): LIPASE, AMYLASE in the last 168 hours. No results for input(s): AMMONIA in the last 168 hours. Coagulation Profile: Recent Labs  Lab 04/07/22 2105 04/08/22 0557  INR 1.0 1.2   Cardiac Enzymes: No results for input(s): CKTOTAL, CKMB, CKMBINDEX, TROPONINI in the last 168 hours. BNP (last 3 results) No results for input(s): PROBNP in the last 8760 hours. HbA1C: No results for input(s): HGBA1C in the last 72 hours. CBG: No results for input(s): GLUCAP in the last 168 hours. Lipid Profile: No results for input(s): CHOL, HDL, LDLCALC, TRIG, CHOLHDL, LDLDIRECT in the last 72 hours. Thyroid Function Tests: No results for input(s): TSH, T4TOTAL, FREET4, T3FREE, THYROIDAB in the last 72 hours. Anemia Panel: No results for input(s): VITAMINB12, FOLATE, FERRITIN, TIBC, IRON, RETICCTPCT in the last 72 hours. Sepsis Labs: Recent Labs  Lab 04/07/22 2105 04/08/22 0557  PROCALCITON  --  1.43  LATICACIDVEN 1.9  --     Recent Results (from the past 240 hour(s))  SARS Coronavirus 2 by RT PCR (hospital order, performed in [facility] hospital lab) *cepheid single result test*     Status: None   Collection Time: 04/07/22  9:02 PM   Specimen: Nasal Swab  Result Value Ref Range Status   SARS Coronavirus 2 by RT PCR NEGATIVE NEGATIVE Final    Comment: (NOTE) SARS-CoV-2 target nucleic acids are NOT DETECTED.  The SARS-CoV-2 RNA is generally detectable in upper and lower respiratory  specimens during the acute phase of infection. The lowest concentration of SARS-CoV-2 viral copies this assay can detect is 250 copies / mL. A negative result does not preclude SARS-CoV-2 infection and should not be used as the sole basis for treatment or other patient management decisions.  A negative result may occur with improper specimen collection / handling, submission of specimen other than nasopharyngeal swab, presence of viral mutation(s) within the areas targeted by this assay, and inadequate number of viral copies (<250 copies / mL). A negative result must be combined with clinical observations, patient history, and epidemiological information.  Fact Sheet for Patients:   RoadLapTop.co.za  Fact Sheet for Healthcare Providers: http://kim-miller.com/  This test is not yet approved or  cleared by the [place] FDA and has been authorized for detection and/or diagnosis of SARS-CoV-2 by FDA under an Emergency Use Authorization (EUA).  This EUA will remain in effect (meaning this test can be used) for the duration of the COVID-19 declaration under Section 564(b)(1) of the Act, 21 U.S.C. section 360bbb-3(b)(1), unless the authorization is terminated or revoked sooner.  Performed at [facility], [address] [place], [place] [address]   Culture, blood (Routine x 2)     Status: Abnormal (Preliminary result)   Collection Time: 04/07/22  9:05 PM   Specimen: BLOOD  Result Value Ref Range Status   Specimen Description   Final    BLOOD LEFT ANTECUBITAL Performed at [facility], [address] [place], [place] [address]    Special Requests   Final    BOTTLES DRAWN AEROBIC AND ANAEROBIC Blood Culture adequate volume Performed at [facility], [address], [place], [place] [address]    Culture  Setup Time   Final    GRAM NEGATIVE COCCOBACILLI IN BOTH AEROBIC AND  ANAEROBIC BOTTLES CRITICAL RESULT  CALLED TO, READ BACK BY AND VERIFIED WITH: Jenny Krueger ON 04/08/22 @ 1952 BY DRT    Culture (A)  Final    HAEMOPHILUS INFLUENZAE BETA LACTAMASE NEGATIVE HEALTH DEPARTMENT NOTIFIED Performed at [facility], [address] [place], [place] [address]    Report Status PENDING  Incomplete  Blood Culture ID Panel (Reflexed)     Status: Abnormal   Collection Time: 04/07/22  9:05 PM  Result Value Ref Range Status   Enterococcus faecalis NOT DETECTED NOT DETECTED Final   Enterococcus Faecium NOT DETECTED NOT DETECTED Final   Listeria monocytogenes NOT DETECTED NOT DETECTED Final   Staphylococcus species NOT DETECTED NOT DETECTED Final   Staphylococcus aureus (BCID) NOT DETECTED NOT DETECTED Final   Staphylococcus epidermidis NOT DETECTED NOT DETECTED Final   Staphylococcus lugdunensis NOT DETECTED NOT DETECTED Final   Streptococcus species NOT DETECTED NOT DETECTED Final   Streptococcus agalactiae NOT DETECTED NOT DETECTED Final   Streptococcus pneumoniae NOT DETECTED NOT DETECTED Final   Streptococcus pyogenes NOT DETECTED NOT DETECTED Final   A.calcoaceticus-baumannii NOT DETECTED NOT DETECTED Final   Bacteroides fragilis NOT DETECTED NOT DETECTED Final   Enterobacterales NOT DETECTED NOT DETECTED Final   Enterobacter cloacae complex NOT DETECTED NOT DETECTED Final   Escherichia coli NOT DETECTED NOT DETECTED Final   Klebsiella aerogenes NOT DETECTED NOT DETECTED Final   Klebsiella oxytoca NOT DETECTED NOT DETECTED Final   Klebsiella pneumoniae NOT DETECTED NOT DETECTED Final   Proteus species NOT DETECTED NOT DETECTED Final   Salmonella species NOT DETECTED NOT DETECTED Final   Serratia marcescens NOT DETECTED NOT DETECTED Final   Haemophilus influenzae DETECTED (A) NOT DETECTED Final    Comment: CRITICAL RESULT CALLED TO, READ BACK BY AND VERIFIED WITH: Jenny Krueger ON 04/08/22 @ 1952 BY DRT    Neisseria meningitidis NOT DETECTED  NOT DETECTED Final   Pseudomonas aeruginosa NOT DETECTED NOT DETECTED Final   Stenotrophomonas maltophilia NOT DETECTED NOT DETECTED Final   Candida albicans NOT DETECTED NOT DETECTED Final   Candida auris NOT DETECTED NOT DETECTED Final   Candida glabrata NOT DETECTED NOT DETECTED Final   Candida krusei NOT DETECTED NOT DETECTED Final   Candida parapsilosis NOT DETECTED NOT DETECTED Final   Candida tropicalis NOT DETECTED NOT DETECTED Final   Cryptococcus neoformans/gattii NOT DETECTED NOT DETECTED Final    Comment: Performed at [facility], [address] [place], [place] [address]  Culture, blood (Routine x 2)     Status: Abnormal (Preliminary result)   Collection Time: 04/07/22  9:16 PM   Specimen: BLOOD  Result Value Ref Range Status   Specimen Description   Final    BLOOD BLOOD LEFT HAND Performed at [facility], [address] [place], [place] [address]    Special Requests   Final    BOTTLES DRAWN AEROBIC ONLY Blood Culture results may not be optimal due to an inadequate volume of blood received in culture bottles Performed at [facility], [address] [place], [place] [address]    Culture  Setup Time   Final    GRAM NEGATIVE COCCOBACILLI AEROBIC BOTTLE ONLY CRITICAL RESULT CALLED TO, READ BACK BY AND VERIFIED WITH: PHARMD Jenny Krueger ON 04/08/22 @ 1958 BY DRT    Culture (A)  Final    HAEMOPHILUS INFLUENZAE BETA LACTAMASE NEGATIVE HEALTH DEPARTMENT NOTIFIED Performed at [facility], [address] [place], [place] [address]    Report  Status PENDING  Incomplete  Urine Culture     Status: Abnormal (Preliminary result)   Collection Time: 04/08/22  3:26 AM   Specimen: Urine, Clean Catch  Result Value Ref Range Status   Specimen Description   Final    URINE, CLEAN CATCH Performed at [facility], [address] [place], [place] [address]    Special Requests   Final    NONE Performed at  [facility], [address], [place], [place] [address]    Culture (A)  Final    >=100,000 COLONIES/mL KLEBSIELLA PNEUMONIAE CULTURE REINCUBATED FOR BETTER GROWTH Performed at [facility], [address], [place], [place] [address]    Report Status PENDING  Incomplete    Radiology Studies: CT HEAD WO CONTRAST ( )  Result Date: 04/07/2022 CLINICAL DATA:  Mental status change, unknown cause. EXAM: CT HEAD WITHOUT CONTRAST TECHNIQUE: Contiguous axial images were obtained from the base of the skull through the vertex without intravenous contrast. RADIATION DOSE REDUCTION: This exam was performed according to the departmental dose-optimization program which includes automated exposure control, adjustment of the mA and/or kV according to patient size and/or use of iterative reconstruction technique. COMPARISON:  None Available. FINDINGS: Brain: There is mild cerebral atrophy, small-vessel disease and atrophic ventriculomegaly. The cerebellum and brainstem are unremarkable. There is no focal asymmetry worrisome for acute cortical based infarct, hemorrhage, mass effect or midline shift. Basal cisterns are patent. Vascular: There are patchy calcifications in the carotid siphons but no hyperdense central vasculature. Skull: No fracture or focal lesion. Sinuses/Orbits: There is mild membrane thickening in the paranasal sinuses without fluid levels. There has been a prior right lens extraction. There is patchy fluid in the lower right mastoid air cells. Remaining mastoid air cells are clear. Other: None. IMPRESSION: Atrophy and small-vessel disease with no acute intracranial CT findings. Sinus and right mastoid disease as described above. Electronically Signed   By: Almira Bar M.D.   On: 04/07/2022 21:56   MR BRAIN WO CONTRAST  Result Date: 04/08/2022 CLINICAL DATA:  Mental status change, unknown cause Headache, new or worsening (Age >= 50y) Sinusitis, rapid progression EXAM: MRI  HEAD WITHOUT CONTRAST TECHNIQUE: Multiplanar, multiecho pulse sequences of the brain and surrounding structures were obtained without intravenous contrast. COMPARISON:  CT head April 07, 2022. FINDINGS: Brain: No acute infarction, hemorrhage, hydrocephalus, extra-axial collection or mass lesion. Mild for age scattered T2/FLAIR hyperintensities in the white matter, nonspecific but compatible with chronic microvascular ischemic disease. Vascular: Major arterial flow voids are maintained at the skull base. Skull and upper cervical spine: Normal marrow signal. Sinuses/Orbits: Mild paranasal sinus mucosal thickening. No acute orbital findings. Other: No mastoid effusions. IMPRESSION: No evidence of acute intracranial abnormality. Electronically Signed   By: Feliberto Harts M.D.   On: 04/08/2022 11:36   MR CERVICAL SPINE WO CONTRAST  Result Date: 04/08/2022 CLINICAL DATA:  Chronic neck pain. EXAM: MRI CERVICAL SPINE WITHOUT CONTRAST TECHNIQUE: Multiplanar, multisequence MR imaging of the cervical spine was performed. No intravenous contrast was administered. COMPARISON:  None Available. FINDINGS: Alignment: Physiologic. Vertebrae: No fracture, evidence of discitis, or bone lesion. Cord: Normal signal and morphology. Posterior Fossa, vertebral arteries, paraspinal tissues: Negative. Disc levels: C2-C3: Negative disc. Minimal facet uncovertebral hypertrophy. No stenosis. C3-C4: Tiny posterior disc osteophyte complex and mild bilateral uncovertebral hypertrophy. Mild left neuroforaminal stenosis. No spinal canal or right neuroforaminal stenosis. C4-C5: Small posterior disc osteophyte complex. Mild right facet uncovertebral hypertrophy. Mild spinal canal and right neuroforaminal  stenosis. No left neuroforaminal stenosis. C5-C6: Small posterior disc osteophyte complex with superimposed broad-based central disc protrusion. Mild bilateral uncovertebral hypertrophy. Mild spinal canal stenosis. Moderate to severe bilateral  neuroforaminal stenosis. C6-C7: Mild disc bulging with superimposed right foraminal disc protrusion. Mild bilateral uncovertebral hypertrophy. Mild spinal canal stenosis. Moderate to severe right and mild left neuroforaminal stenosis. C7-T1: Negative disc. Moderate right and mild left facet arthropathy. No stenosis. IMPRESSION: 1. Multilevel degenerative changes of the cervical spine as described above. 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 Electronically Signed   By: Obie Dredge M.D.   On: 04/08/2022 12:00   DG Chest Port 1 View  Result Date: 04/07/2022 CLINICAL DATA:  Fever, vomiting, diarrhea, suspected sepsis and altered mental status. EXAM: PORTABLE CHEST 1 VIEW COMPARISON:  October 18, 2018 FINDINGS: The heart size and mediastinal contours are within normal limits. Both lungs are clear. The visualized skeletal structures are unremarkable. IMPRESSION: No active disease. Electronically Signed   By: Aram Candela M.D.   On: 04/07/2022 21:36     Scheduled Meds:  acetaminophen  325 mg Oral Once   amLODipine  5 mg Oral Daily   enoxaparin (LOVENOX) injection  40 mg Subcutaneous Q24H   hydrocortisone cream   Topical TID   hydrOXYzine  25 mg Oral Once   lidocaine-EPINEPHrine (PF)  10 mL Infiltration Once   sodium chloride flush  3 mL Intravenous Q12H   Continuous Infusions:  cefTRIAXone (ROCEPHIN)  IV Stopped (04/08/22 2215)   lactated ringers 100 mL/hr at 04/09/22 1121   promethazine (PHENERGAN) injection (IM or IVPB)       LOS: 0 days   Marguerita Merles, DO [facility] Available via Epic secure chat 7am-7pm After these hours, please refer to coverage provider listed on [email or web address] 04/09/2022, 2:28 PM

## 2022-04-09 NOTE — Procedures (Signed)
.  lumbarpunctureLumbar Puncture Procedure Note  Jenny Krueger  [id number]  01-18-1946  Date:04/09/22  Time:4:03 PM   Provider Performing:Sesar Madewell Jari Favre   Procedure: Lumbar Puncture (14970)  Indication(s) Rule out meningitis  2 nurses present during procedure: Ariania and rapid response nurse.  Consent Risks of the procedure as well as the alternatives and risks of each were explained to the patient and/or caregiver.  Consent for the procedure was obtained and is signed in the bedside chart  Anesthesia Topical 2% lidocaine with epi.   Time Out Verified patient identification, verified procedure, site/side was marked, verified correct patient position, special equipment/implants available, medications/allergies/relevant history reviewed, required imaging and test results available.   Sterile Technique Maximal sterile technique including sterile barrier drape, hand hygiene, sterile gown, sterile gloves, mask, hair covering.    Procedure Description Using palpation, approximate location of L3-L4 space identified.   Lidocaine used to anesthetize skin and subcutaneous tissue overlying this area.  A 20g spinal needle was then used to access the subarachnoid space. Opening pressure: 29cm H2O. Closing pressure:Not obtained. 20 CSF obtained.  Complications/Tolerance None; patient tolerated the procedure well.   EBL Minimal   Specimen(s) CSF

## 2022-04-09 NOTE — Consult Note (Incomplete)
[facility]    Date of Admission:  04/07/2022   Total days of inpatient antibiotics 1        Reason for Consult: Concern for H influenzae meningitis    Principal Problem:   SIRS (systemic inflammatory response syndrome) (HCC) Active Problems:   Essential hypertension   Assessment: 75 year old female with fibromyalgia, hypertension admitted with altered mental status.  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  ID engaged for concern of meningitis. #H influenza bacteremia #Blood cultures positive for Klebsiella pneumonia Altered mental status with fevers and leukocytosis --CT head showed no acute abnormalities. - Corrected for patient has had recent sinus infection for 3 years she has been on antibiotics for that.  Thank given she woke up with nausea vomiting with cough, headaches and fevers I think LP is warranted, given concern for H. influenzae meningitis. Recommendations:  -Continue CTX 2gm q12h -Agree with LP -Follow Blood Cx for sensitivities Microbiology:   Antibiotics: Cefepime 6/1 - 6/2 Ceftriaxone//2-present Metronidazole 6/1 - 2/2 PIP Tazo 6/1 Vancomycin 6/1 Cultures: Blood 6/12/2 haemophilus influenza,  beta-lactamase negative Urine 6/1 greater than 100,000 colonies of Klebsiella pneumonia Other   HPI: Jenny Krueger is a 76 y.o. 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  History obtained per chart review.  Patient was at baseline was noted to be altered mental status on day admission.  She has had a runny nose since suppression.  She had increased somnolence after taking Benadryl.  She will call with episodes of nausea and vomiting with associated fevers and headaches.  In the ED she had a temp of 1.1.1.  WBC 8.2K.  CT head and chest x-ray was unrevealing.  She was initially started on vancomycin PIP Tazo which was consistent to  ceftriaxone.  Blood cultures returned positive for haemophilus influenza.  Urine cultures returned positive for Klebsiella pneumonia.   Review of Systems: Review of Systems  All other systems reviewed and are negative.  Past Medical History:  Diagnosis Date   Hyperlipidemia    Hypertension    Hypothyroid     Social History   Tobacco Use   Smoking status: Former    Types: Cigarettes    Quit date: 10/18/1988    Years since quitting: 33.4   Smokeless tobacco: Never  Substance Use Topics   Alcohol use: Yes   Drug use: Never    Family History  Problem Relation Age of Onset   Stroke Mother    Thyroid disease Mother    Diabetes Father    Heart attack Father    Scheduled Meds:  acetaminophen  325 mg Oral Once   amLODipine  5 mg Oral Daily   enoxaparin (LOVENOX) injection  40 mg Subcutaneous Q24H   hydrocortisone cream   Topical TID   hydrOXYzine  25 mg Oral Once   lidocaine-EPINEPHrine  1.7 mL Other Once   sodium chloride flush  3 mL Intravenous Q12H   Continuous Infusions:  cefTRIAXone (ROCEPHIN)  IV Stopped (04/08/22 2215)   lactated ringers 100 mL/hr at 04/09/22 1121   promethazine (PHENERGAN) injection (IM or IVPB)     PRN Meds:.acetaminophen **OR** acetaminophen, ondansetron **OR** ondansetron (ZOFRAN) IV, polyethylene glycol, prochlorperazine, promethazine (PHENERGAN) injection (IM or IVPB) Allergies  Allergen Reactions   Codeine Nausea Only   Penicillin G Rash    Other reaction(s): rash    OBJECTIVE: Blood pressure (!) 156/68, pulse  81, temperature 98.7 F (37.1 C), temperature source Oral, resp. rate 20, height 5\' 5"  (1.651 m), weight 90.4 kg, SpO2 99 %.  Physical Exam Constitutional:      Appearance: Normal appearance.  HENT:     Head: Normocephalic and atraumatic.     Right Ear: Tympanic membrane normal.     Left Ear: Tympanic membrane normal.     Nose: Nose normal.     Mouth/Throat:     Mouth: Mucous membranes are moist.  Eyes:     Extraocular  Movements: Extraocular movements intact.     Conjunctiva/sclera: Conjunctivae normal.     Pupils: Pupils are equal, round, and reactive to light.  Cardiovascular:     Rate and Rhythm: Normal rate and regular rhythm.     Heart sounds: No murmur heard.   No friction rub. No gallop.  Pulmonary:     Effort: Pulmonary effort is normal.     Breath sounds: Normal breath sounds.  Abdominal:     General: Abdomen is flat.     Palpations: Abdomen is soft.  Musculoskeletal:        General: Normal range of motion.  Skin:    General: Skin is warm and dry.  Neurological:     General: No focal deficit present.     Mental Status: She is alert and oriented to person, place, and time.  Psychiatric:        Mood and Affect: Mood normal.    Lab Results Lab Results  Component Value Date   WBC 15.7 (H) 04/09/2022   HGB 12.2 04/09/2022   HCT 36.4 04/09/2022   MCV 95.3 04/09/2022   PLT 251 04/09/2022    Lab Results  Component Value Date   CREATININE 0.51 04/09/2022   BUN 16 04/09/2022   NA 138 04/09/2022   K 3.6 04/09/2022   CL 105 04/09/2022   CO2 26 04/09/2022    Lab Results  Component Value Date   ALT 16 04/09/2022   AST 17 04/09/2022   ALKPHOS 91 04/09/2022   BILITOT 1.0 04/09/2022       Laurice Record, MD [place] [facility] 04/09/2022, 1:09 PM

## 2022-04-10 DIAGNOSIS — R6510 Systemic inflammatory response syndrome (SIRS) of non-infectious origin without acute organ dysfunction: Secondary | ICD-10-CM | POA: Diagnosis not present

## 2022-04-10 DIAGNOSIS — Z87891 Personal history of nicotine dependence: Secondary | ICD-10-CM | POA: Diagnosis not present

## 2022-04-10 DIAGNOSIS — I10 Essential (primary) hypertension: Secondary | ICD-10-CM | POA: Diagnosis not present

## 2022-04-10 DIAGNOSIS — G000 Hemophilus meningitis: Secondary | ICD-10-CM

## 2022-04-10 DIAGNOSIS — A413 Sepsis due to Hemophilus influenzae: Secondary | ICD-10-CM | POA: Diagnosis not present

## 2022-04-10 LAB — CBC WITH DIFFERENTIAL/PLATELET
Abs Immature Granulocytes: 0.08 10*3/uL — ABNORMAL HIGH (ref 0.00–0.07)
Basophils Absolute: 0 10*3/uL (ref 0.0–0.1)
Basophils Relative: 0 %
Eosinophils Absolute: 0.2 10*3/uL (ref 0.0–0.5)
Eosinophils Relative: 2 %
HCT: 35.3 % — ABNORMAL LOW (ref 36.0–46.0)
Hemoglobin: 12.1 g/dL (ref 12.0–15.0)
Immature Granulocytes: 1 %
Lymphocytes Relative: 15 %
Lymphs Abs: 1.8 10*3/uL (ref 0.7–4.0)
MCH: 32.1 pg (ref 26.0–34.0)
MCHC: 34.3 g/dL (ref 30.0–36.0)
MCV: 93.6 fL (ref 80.0–100.0)
Monocytes Absolute: 0.8 10*3/uL (ref 0.1–1.0)
Monocytes Relative: 6 %
Neutro Abs: 9.1 10*3/uL — ABNORMAL HIGH (ref 1.7–7.7)
Neutrophils Relative %: 76 %
Platelets: 250 10*3/uL (ref 150–400)
RBC: 3.77 MIL/uL — ABNORMAL LOW (ref 3.87–5.11)
RDW: 12.6 % (ref 11.5–15.5)
WBC: 12 10*3/uL — ABNORMAL HIGH (ref 4.0–10.5)
nRBC: 0 % (ref 0.0–0.2)

## 2022-04-10 LAB — COMPREHENSIVE METABOLIC PANEL
ALT: 14 U/L (ref 0–44)
AST: 16 U/L (ref 15–41)
Albumin: 2.7 g/dL — ABNORMAL LOW (ref 3.5–5.0)
Alkaline Phosphatase: 84 U/L (ref 38–126)
Anion gap: 8 (ref 5–15)
BUN: 15 mg/dL (ref 8–23)
CO2: 28 mmol/L (ref 22–32)
Calcium: 8.9 mg/dL (ref 8.9–10.3)
Chloride: 103 mmol/L (ref 98–111)
Creatinine, Ser: 0.51 mg/dL (ref 0.44–1.00)
GFR, Estimated: >60 mL/min (ref >60)
Glucose, Bld: 122 mg/dL — ABNORMAL HIGH (ref 70–99)
Potassium: 3.3 mmol/L — ABNORMAL LOW (ref 3.5–5.1)
Sodium: 139 mmol/L (ref 135–145)
Total Bilirubin: 0.8 mg/dL (ref 0.3–1.2)
Total Protein: 6.2 g/dL — ABNORMAL LOW (ref 6.5–8.1)

## 2022-04-10 LAB — PHOSPHORUS: Phosphorus: 2.1 mg/dL — ABNORMAL LOW (ref 2.5–4.6)

## 2022-04-10 LAB — MAGNESIUM: Magnesium: 2.3 mg/dL (ref 1.7–2.4)

## 2022-04-10 LAB — PROCALCITONIN: Procalcitonin: 0.51 ng/mL

## 2022-04-10 MED ORDER — POTASSIUM CHLORIDE CRYS ER 20 MEQ PO TBCR
40.0000 meq | EXTENDED_RELEASE_TABLET | Freq: Once | ORAL | Status: AC
Start: 2022-04-10 — End: 2022-04-10
  Administered 2022-04-10: 40 meq via ORAL
  Filled 2022-04-10: qty 2

## 2022-04-10 MED ORDER — SODIUM CHLORIDE 0.9 % IV SOLN
2.0000 g | Freq: Three times a day (TID) | INTRAVENOUS | Status: DC
  Administered 2022-04-10 – 2022-04-13 (×9): 2 g via INTRAVENOUS
  Filled 2022-04-10 (×14): qty 40

## 2022-04-10 MED ORDER — DIPHENHYDRAMINE HCL 50 MG/ML IJ SOLN
12.5000 mg | Freq: Four times a day (QID) | INTRAMUSCULAR | Status: DC | PRN
  Administered 2022-04-10 – 2022-04-14 (×5): 12.5 mg via INTRAVENOUS
  Filled 2022-04-10 (×6): qty 1

## 2022-04-10 MED ORDER — K PHOS MONO-SOD PHOS DI & MONO 155-852-130 MG PO TABS
500.0000 mg | ORAL_TABLET | Freq: Two times a day (BID) | ORAL | Status: AC
Start: 2022-04-10 — End: 2022-04-10
  Administered 2022-04-10 (×2): 500 mg via ORAL
  Filled 2022-04-10 (×2): qty 2

## 2022-04-10 MED ORDER — SODIUM CHLORIDE 0.9 % IV SOLN: 2.0000 g | Freq: Two times a day (BID) | INTRAVENOUS | Status: DC

## 2022-04-10 NOTE — Plan of Care (Signed)
  Problem: Education: Goal: Knowledge of General Education information will improve Description: Including pain rating scale, medication(s)/side effects and non-pharmacologic comfort measures Outcome: Progressing   Problem: Clinical Measurements: Goal: Ability to maintain clinical measurements within normal limits will improve Outcome: Progressing Goal: Will remain free from infection Outcome: Progressing Goal: Diagnostic test results will improve Outcome: Progressing   Problem: Activity: Goal: Risk for activity intolerance will decrease Outcome: Progressing   Problem: Pain Managment: Goal: General experience of comfort will improve Outcome: Progressing   Problem: Safety: Goal: Ability to remain free from injury will improve Outcome: Progressing   Problem: Skin Integrity: Goal: Risk for impaired skin integrity will decrease Outcome: Progressing   

## 2022-04-10 NOTE — Consult Note (Addendum)
[facility]    Date of Admission:  04/07/2022   Total days of inpatient antibiotics 2        Reason for Consult: H influenza bacteremia    Principal Problem:   SIRS (systemic inflammatory response syndrome) (HCC) Active Problems:   Essential hypertension   Assessment: 76 year old female with fibromyalgia, hypertension admitted with altered mental status.  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  ID engaged for concern of meningitis.   #H influenza bacteremia, beta lactamase negative with likely meningitis #Pleocytosis in traumatic tap #Drug rash 2/2 cefepime and ceftriaxone #PEN allergy #Urine Cx+ Kleb pneumoniae-Asymptomatic bacteruria Altered mental status with fevers and leukocytosis --CT head showed no acute abnormalities. -She has been around sick toddlers about a month ago->pt devloped sinusitis about 2 weeks ago(self diagnosed-rhinorrhea and sinus pain)->did not seek medical care->son did a welfare check on Thursday and pt could barely open her eyes->brought to ED. Antibiotics started.  -Pt underwent LP on 6/3 with rbc 21, wbc 107(NO 50%, lymph 0,  50% mono), GLC 21, PTN 110, CSF stain wbc no organisms, Cx pending. VDRL CSF, HSV 1/2 PCR pending -Pt's HA and neck stiffness is resolved. I suspect H influenza bacteremia and meningitis as she is at baseline following antibiotics.  -She did not report any UTI symptoms as kleb pneumo in Urine Cx is c/w asymptomatic bacteruria.   Recommendations:  -D/C ceftriaxone -Start meropenem.  She  developed pruritis and rash on chest and abdomen after starting antibiotics c/w drug rash. She reports she has a a similar rash when she took penicillin -Follow CSF studies: Cx pending. VDRL CSF, HSV 1/2 PCR -Follow up blood Cx and Urine Cx Microbiology:   Antibiotics: Cefepime 6/1-6/2 Ceftriaxone 6/2-p Metronidazole 6/1-6/2 Pip-tazo  6/1 Vancomycin  6/1  Cultures: Blood 6/12/2 haemophilus influenza,  beta-lactamase negative Urine 6/1 greater than 100,000 colonies of Klebsiella pneumonia    HPI: Jenny Krueger is a 76 y.o. 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   Patient was at baseline was noted to be altered mental status on day admission.  She has had sinusitis for about 2 weeks(pt self diagnosed as she had sinus pressure and runny nose). She had increased somnolence after taking Benadryl.  She will call with episodes of nausea and vomiting with associated fevers and headaches.  In the ED she had a temp of 101.1.  WBC 18.2K.  CT head and chest x-ray was unrevealing.  She was initially started on vancomycin and pip-tazo -> ceftriaxone.  Blood cultures returned positive for haemophilus influenza.  Urine cultures returned positive for Klebsiella pneumonia. ID engaged due to concern for meningitis.      Review of Systems: Review of Systems  All other systems reviewed and are negative.  Past Medical History:  Diagnosis Date   Hyperlipidemia    Hypertension    Hypothyroid     Social History   Tobacco Use   Smoking status: Former    Types: Cigarettes    Quit date: 10/18/1988    Years since quitting: 33.4   Smokeless tobacco: Never  Substance Use Topics   Alcohol use: Yes   Drug use: Never    Family History  Problem Relation Age of Onset   Stroke Mother    Thyroid disease Mother    Diabetes Father    Heart attack Father    Scheduled Meds:  acetaminophen  325 mg Oral Once   amLODipine  5 mg Oral Daily   enoxaparin (LOVENOX) injection  40 mg Subcutaneous Q24H   hydrocortisone cream   Topical TID   hydrOXYzine  25 mg Oral Once   sodium chloride flush  3 mL Intravenous Q12H   Continuous Infusions:  cefTRIAXone (ROCEPHIN)  IV 2 g (04/10/22 0049)   lactated ringers 100 mL/hr at 04/10/22 0939   promethazine (PHENERGAN) injection (IM or IVPB)     PRN  Meds:.acetaminophen **OR** acetaminophen, ondansetron **OR** ondansetron (ZOFRAN) IV, polyethylene glycol, prochlorperazine, promethazine (PHENERGAN) injection (IM or IVPB) Allergies  Allergen Reactions   Codeine Nausea Only   Penicillin G Rash    Other reaction(s): rash    OBJECTIVE: Blood pressure (!) 152/66, pulse 75, temperature 99.4 F (37.4 C), temperature source Oral, resp. rate 20, height 5\' 5"  (1.651 m), weight 90.4 kg, SpO2 100 %.  Physical Exam Constitutional:      Appearance: Normal appearance.  HENT:     Head: Normocephalic and atraumatic.     Right Ear: Tympanic membrane normal.     Left Ear: Tympanic membrane normal.     Nose: Nose normal.     Mouth/Throat:     Mouth: Mucous membranes are moist.  Eyes:     Extraocular Movements: Extraocular movements intact.     Conjunctiva/sclera: Conjunctivae normal.     Pupils: Pupils are equal, round, and reactive to light.  Cardiovascular:     Rate and Rhythm: Normal rate and regular rhythm.     Heart sounds: No murmur heard.   No friction rub. No gallop.  Pulmonary:     Effort: Pulmonary effort is normal.     Breath sounds: Normal breath sounds.  Abdominal:     General: Abdomen is flat.     Palpations: Abdomen is soft.  Musculoskeletal:        General: Normal range of motion.  Skin:    General: Skin is warm and dry.  Neurological:     General: No focal deficit present.     Mental Status: She is alert and oriented to person, place, and time.  Psychiatric:        Mood and Affect: Mood normal.    Lab Results Lab Results  Component Value Date   WBC 12.0 (H) 04/10/2022   HGB 12.1 04/10/2022   HCT 35.3 (L) 04/10/2022   MCV 93.6 04/10/2022   PLT 250 04/10/2022    Lab Results  Component Value Date   CREATININE 0.51 04/09/2022   BUN 16 04/09/2022   NA 138 04/09/2022   K 3.6 04/09/2022   CL 105 04/09/2022   CO2 26 04/09/2022    Lab Results  Component Value Date   ALT 16 04/09/2022   AST 17 04/09/2022    ALKPHOS 91 04/09/2022   BILITOT 1.0 04/09/2022       06/09/2022, MD [facility] 04/10/2022, 9:53 AM

## 2022-04-10 NOTE — Progress Notes (Signed)
PROGRESS NOTE    [name]  JHE:[id number] DOB: 07/14/1946 DOA: 04/07/2022 PCP: Juluis Rainier, MD (Inactive)   Brief Narrative:  HPI per Dr. Beola Cord on 04/07/22 LUCIANNA Krueger is a 76 y.o. female with medical history significant of hypertension, hyperlipidemia, hypothyroidism, degenerative disc disease, fibromyalgia presenting with altered mental status.  History obtained with assistance of chart review and family.  Patient had been seemingly at baseline but was noted to be altered today when visiting with family.  Has had recent sinus infections.  Earlier today was noted to have increased somnolence after taking Benadryl.  Upon waking did have episode of nausea and vomiting. Also reports some intermittent fevers, and headaches.   Denies chest pain, shortness of breath, abdominal pain, constipation, diarrhea   ED Course: Vital signs in the ED notable for fever to 101.1, tachypnea in the 20s, blood pressure in the 130s to 140s systolic.  Lab work-up included CMP with potassium 3.3, glucose 136, T. bili 1.3.  CBC with leukocytosis to 18.2.  PT and INR within normal limits.  Lactic acid normal with repeat pending.  COVID negative.  Urinalysis pending.  Blood cultures pending.  Chest x-ray without acute abnormality.  CT of the head without acute abnormality but does show sinus disease.  Patient received vancomycin and Zosyn in the ED as well as Tylenol.  She did have some improvement in her symptoms while in the ED as well.   **Interim History  Continues to have a headache and some neck pain.  Also had some nausea and vomiting and was given Compazine.  Asking for some Aleve for her head.  States that she gets headaches whenever she gets a sinus infection.  Urinalysis shows likely she has a urinary tract infection but she remains on broad-spectrum antibiotics for now and will de-escalate once cultures are resulted.   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   -Also noted to have increase somnolence after taking Benadryl. Per family, mentation has improved in ED. -Had episodes of nausea and vomiting as well noted to have fever in ED of 101.1.  Also noted to be tachypneic and have leukocytosis to 18.2.  Meeting SIRS criteria. -Source now likley 2/2 to H Influenzae Bacteremia and Klebseilla PNA as chest x-ray was clear and CT head showed sinus disease but nothing acute.   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 >Initial lactic acid normal at 1.9 -Continue to monitor on progressive unit for now -Continue Trend fever curve and white count and WBC went from 18.2 is now 18.8 -> 15.7 -> 12.0 - Follow-up urinalysis, urine culture, blood cultures -Switched antibiotics to vancomycin cefepime and Flagyl and will continue for now -Received 1000 g of acetaminophen last night and is now on 650 mg p.o. every 6 as needed for mild pain or fever greater than 100 - 1 L fluid  bolus and now on maintenance IV fluids with LR at 100 mL/hr and will reduce rate to  -Checked Procalcitonin and is now 1.43 -> 0.51 -ID consulted for further evaluation and recc's and recommending obtaining LP; Discussed with Neuro and they recommending IR to do it under Fluoro. IR wants it attempted on the Floor prior to them  attempting. IR and Neuro to discuss with themselves but regardless LP needs to be done per ID Recc's  -LP done and showed RBC of 1260 on Tube 1 and  21 on Tube 4, WBC elevated of 2 1 being 195 and 2 for being 107 with segmented neutrophils being 63 and 50, glucose of 21 protein of 110 and CSF showing no WBC cultures and VDRL CSF, at bedtime V1 and 2 PCR pending -Continue to monitor and trend and follow clinical course ID recommends continuing ceftriaxone 2 g every 12   Acute Headache associated with Neck Pain, improving  -Does not really have any nuchal rigidity but continues to complain of posterior pain in her neck as well as head and now that she has H Influenzae Bacteremia it is concerning for Meningitis  -States that she gets headaches when she gets sinus infections and is worse on the right side -Head CT w/o Contrast showed "Atrophy and small-vessel disease with no acute intracranial CT findings. Sinus and right mastoid disease as described above" -MRI Brain w/o Contrast done and showed "No evidence of acute intracranial abnormality" and MRI of the Cervical Spine showed Multilevel degenerative changes of the cervical spine as described above. 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 bili is now 1.3 -> 1.0 -> 0.8 -Continue to monitor and trend and continue with IV fluid hydration -Repeat CMP in a.m.  Hypoalbuminemia -Pateint's Albumin Level went from 3.5 -> 3.1 -> 3.0 -> 2.7 -Continue to Monitor and trend  Hypertension -Continue home Amlodipine -Holding home Losartan-Hydrochlorothiazide combo in  the setting of  sepsis -Continue monitor blood pressures per protocol -Last blood pressure reading was elevated at 138/64   Hypokalemia -Mild and in the setting of nausea vomiting -Potassium has gone from 3.2 -> 3.6 -> 3.3 and will replete with po Kcl 40 mEQ x1 and po K Phos Neutral 500 mg po BID x2 -Continue to monitor and replete as necessary -Repeat CMP in a.m.  Rash -Noted to be on her chest and will start Benadryl for her itching as well as hydrocortisone cream -Unclear if this is related to her meningitis -Due to monitor and if necessary may need systemic steroids   Hypophosphatemia -Patient's Phos level was 2.1 -Replete with p.o. K-Phos Neutral 500 mg p.o. twice daily x2 doses again -Continue to monitor and replete as necessary -Repeat Phos in a.m.   Obesity -Complicates overall prognosis and care -Estimated body mass index is 33.16 kg/m as calculated from the following:   Height as of this encounter:  (1.651 m).   Weight as of this encounter: 90.4 kg.  -Weight Loss and Dietary Counseling given   DVT prophylaxis: enoxaparin (LOVENOX) injection 40 mg Start: 04/08/22 1000  Code Status: Full Code Family Communication: Discussed with Son at bedside   Disposition Plan:  Level of care: Progressive Status is: Inpatient Remains inpatient appropriate because: Needs ID clearance and improvement and evaluation by PT/OT   Consultants:  Neurology for LP ID  Procedures:  LP  Antimicrobials:  Anti-infectives (From admission, onward)    Start     Dose/Rate Route Frequency Ordered Stop   04/10/22 2200  cefTRIAXone (ROCEPHIN) 2 g in sodium chloride 0.9 % 100 mL IVPB        2 g 200 mL/hr over 30 Minutes Intravenous Every 12 hours 04/10/22 1011     04/08/22 2200  vancomycin (VANCOCIN) IVPB 1000 mg/200 mL premix  Status:  Discontinued        1,000 mg 200 mL/hr over 60 Minutes Intravenous Every 24 hours 04/08/22 0024 04/08/22 2011   04/08/22 2200  cefTRIAXone  (ROCEPHIN) 2 g in sodium chloride 0.9 % 100 mL IVPB  Status:  Discontinued        2 g 200 mL/hr over 30 Minutes Intravenous Every 24 hours 04/08/22 2011 04/10/22 1011   04/08/22 0600  ceFEPIme (MAXIPIME) 2 g in sodium chloride 0.9 % 100 mL IVPB  Status:  Discontinued        2 g 200 mL/hr over 30 Minutes Intravenous Every 8 hours 04/08/22 0024 04/08/22 2011   04/08/22 0015  metroNIDAZOLE (FLAGYL) IVPB 500 mg  Status:  Discontinued        500 mg 100 mL/hr over 60 Minutes Intravenous Every 12 hours 04/08/22 0008 04/08/22 2011   04/07/22 2230  piperacillin-tazobactam (ZOSYN) IVPB 3.375 g        3.375 g 100 mL/hr over 30 Minutes Intravenous  Once 04/07/22 2224 04/07/22 2318   04/07/22 2230  vancomycin (VANCOCIN) IVPB 1000 mg/200 mL premix        1,000 mg 200 mL/hr over 60 Minutes Intravenous  Once 04/07/22 2224 04/08/22 0049       Subjective: Seen and examined at bedside and she states that she is doing better.  Denies any nausea or vomiting.  Thinks her headache is doing better.  States that she slept fairly well last night.  Denies any other concerns or complaints at this time.  Objective: Vitals:   04/09/22 1704 04/09/22 2008 04/10/22 0405 04/10/22 1300  BP: (!) 146/70 (!) 166/75 (!) 152/66 138/64  Pulse: 81 76 75 (!) 59  Resp: 16 18 20 20   Temp: 98 F (36.7 C) 98.4 F (36.9 C) 99.4 F (37.4 C) 98.2 F (36.8 C)  TempSrc: Oral Oral Oral Oral  SpO2: 100% 92% 100% 98%  Weight:      Height:        Intake/Output Summary (Last 24 hours) at 04/10/2022 1511 Last data filed at 04/10/2022 0533 Gross per 24 hour  Intake 1469.94 ml  Output 1000 ml  Net 469.94 ml   Filed Weights   04/07/22 2055 04/09/22 0400  Weight: 82 kg 90.4 kg   Examination: Physical Exam:  Constitutional: WN/WD obese Caucasian female currently in no acute distress Respiratory: Diminished to auscultation bilaterally, no wheezing, rales, rhonchi or crackles. Normal respiratory effort and patient is not  tachypenic. No accessory muscle use.  Unlabored breathing Cardiovascular: RRR, no murmurs / rubs / gallops. S1 and S2 auscultated.  Abdomen: Soft, non-tender, distended secondary body habitus. Bowel sounds positive.  GU: Deferred. Musculoskeletal: No clubbing / cyanosis of digits/nails. No joint deformity upper and lower extremities Skin: Has a upper rash on her  trunk and chest.  That is itchy Neurologic: CN 2-12 grossly intact with no focal deficits. Romberg sign cerebellar reflexes not assessed.  Psychiatric: Normal judgment and insight. Alert and oriented x 3. Normal mood and appropriate affect.   Data Reviewed: I have personally reviewed following labs and imaging studies  CBC: Recent Labs  Lab 04/07/22 2105 04/08/22 0557 04/09/22 0914 04/10/22 0916  WBC 18.2* 18.8* 15.7* 12.0*  NEUTROABS 15.8*  --  13.0* 9.1*  HGB 14.3 12.6 12.2 12.1  HCT 42.1 37.7 36.4 35.3*  MCV 93.6 93.8 95.3 93.6  PLT 283 266 251 250   Basic Metabolic Panel: Recent Labs  Lab 04/07/22 2105 04/08/22 0557 04/09/22 0914 04/10/22 0916  NA 140 138 138 139  K 3.3* 3.2* 3.6 3.3*  CL 104 104 105 103  CO2 GLUCOSE 136* 152* 121* 122*  BUN CREATININE 0.75 0.66 0.51 0.51  CALCIUM 9.3 8.8* 9.4 8.9  MG  --   --  2.2 2.3  PHOS  --   --  2.1* 2.1*   GFR: Estimated Creatinine Clearance: 68.6 mL/min (by C-G formula based on SCr of 0.51 mg/dL). Liver Function Tests: Recent Labs  Lab 04/07/22 2105 04/08/22 0557 04/09/22 0914 04/10/22 0916  AST ALT ALKPHOS 101 86 91 84  BILITOT 1.3* 1.3* 1.0 0.8  PROT 7.4 6.5 6.7 6.2*  ALBUMIN 3.5 3.1* 3.0* 2.7*   No results for input(s): LIPASE, AMYLASE in the last 168 hours. No results for input(s): AMMONIA in the last 168 hours. Coagulation Profile: Recent Labs  Lab 04/07/22 2105 04/08/22 0557  INR 1.0 1.2   Cardiac Enzymes: No results for input(s): CKTOTAL, CKMB, CKMBINDEX, TROPONINI in the last 168  hours. BNP (last 3 results) No results for input(s): PROBNP in the last 8760 hours. HbA1C: No results for input(s): HGBA1C in the last 72 hours. CBG: Recent Labs  Lab 04/09/22 1851  GLUCAP 88   Lipid Profile: No results for input(s): CHOL, HDL, LDLCALC, TRIG, CHOLHDL, LDLDIRECT in the last 72 hours. Thyroid Function Tests: No results for input(s): TSH, T4TOTAL, FREET4, T3FREE, THYROIDAB in the last 72 hours. Anemia Panel: No results for input(s): VITAMINB12, FOLATE, FERRITIN, TIBC, IRON, RETICCTPCT in the last 72 hours. Sepsis Labs: Recent Labs  Lab 04/07/22 2105 04/08/22 0557 04/10/22 0920  PROCALCITON  --  1.43 0.51  LATICACIDVEN 1.9  --   --     Recent Results (from the past 240 hour(s))  SARS Coronavirus 2 by RT PCR (hospital order, performed in [facility] hospital lab) *cepheid single result test*     Status: None   Collection Time: 04/07/22  9:02 PM   Specimen: Nasal Swab  Result Value Ref Range Status   SARS Coronavirus 2 by RT PCR NEGATIVE NEGATIVE Final    Comment: (NOTE) SARS-CoV-2 target nucleic acids are NOT DETECTED.  The SARS-CoV-2 RNA is generally detectable in upper and lower respiratory specimens during the acute phase of infection. The lowest concentration of SARS-CoV-2 viral copies this assay can detect is 250 copies / mL. A negative result does not preclude SARS-CoV-2 infection and should not be used as the sole basis for treatment or other patient management decisions.  A negative result may occur with improper specimen collection / handling, submission of specimen other than nasopharyngeal swab, presence of viral mutation(s) within the areas targeted by this assay, and inadequate number of viral copies (<250  copies / mL). A negative result must be combined with clinical observations, patient history, and epidemiological information.  Fact Sheet for Patients:   RoadLapTop.co.za  Fact Sheet for Healthcare  Providers: http://kim-miller.com/  This test is not yet approved or  cleared by the [place] FDA and has been authorized for detection and/or diagnosis of SARS-CoV-2 by FDA under an Emergency Use Authorization (EUA).  This EUA will remain in effect (meaning this test can be used) for the duration of the COVID-19 declaration under Section 564(b)(1) of the Act, 21 U.S.C. section 360bbb-3(b)(1), unless the authorization is terminated or revoked sooner.  Performed at [facility], [address], [place], [place] [address]   Culture, blood (Routine x 2)     Status: Abnormal (Preliminary result)   Collection Time: 04/07/22  9:05 PM   Specimen: BLOOD  Result Value Ref Range Status   Specimen Description   Final    BLOOD LEFT ANTECUBITAL Performed at [facility], [address] [place], [place] [address]    Special Requests   Final    BOTTLES DRAWN AEROBIC AND ANAEROBIC Blood Culture adequate volume Performed at [facility], [address], [place], [place] [address]    Culture  Setup Time   Final    GRAM NEGATIVE COCCOBACILLI IN BOTH AEROBIC AND ANAEROBIC BOTTLES CRITICAL RESULT CALLED TO, READ BACK BY AND VERIFIED WITH: MARY ASHLYN TUCKER ON 04/08/22 @ 1952 BY DRT    Culture (A)  Final    HAEMOPHILUS INFLUENZAE BETA LACTAMASE NEGATIVE HEALTH DEPARTMENT NOTIFIED Performed at [facility], [address], [place], [place] [address]    Report Status PENDING  Incomplete  Blood Culture ID Panel (Reflexed)     Status: Abnormal   Collection Time: 04/07/22  9:05 PM  Result Value Ref Range Status   Enterococcus faecalis NOT DETECTED NOT DETECTED Final   Enterococcus Faecium NOT DETECTED NOT DETECTED Final   Listeria monocytogenes NOT DETECTED NOT DETECTED Final   Staphylococcus species NOT DETECTED NOT DETECTED Final   Staphylococcus aureus (BCID) NOT DETECTED NOT DETECTED Final   Staphylococcus  epidermidis NOT DETECTED NOT DETECTED Final   Staphylococcus lugdunensis NOT DETECTED NOT DETECTED Final   Streptococcus species NOT DETECTED NOT DETECTED Final   Streptococcus agalactiae NOT DETECTED NOT DETECTED Final   Streptococcus pneumoniae NOT DETECTED NOT DETECTED Final   Streptococcus pyogenes NOT DETECTED NOT DETECTED Final   A.calcoaceticus-baumannii NOT DETECTED NOT DETECTED Final   Bacteroides fragilis NOT DETECTED NOT DETECTED Final   Enterobacterales NOT DETECTED NOT DETECTED Final   Enterobacter cloacae complex NOT DETECTED NOT DETECTED Final   Escherichia coli NOT DETECTED NOT DETECTED Final   Klebsiella aerogenes NOT DETECTED NOT DETECTED Final   Klebsiella oxytoca NOT DETECTED NOT DETECTED Final   Klebsiella pneumoniae NOT DETECTED NOT DETECTED Final   Proteus species NOT DETECTED NOT DETECTED Final   Salmonella species NOT DETECTED NOT DETECTED Final   Serratia marcescens NOT DETECTED NOT DETECTED Final   Haemophilus influenzae DETECTED (A) NOT DETECTED Final    Comment: CRITICAL RESULT CALLED TO, READ BACK BY AND VERIFIED WITH: MARY ASHLYN TUCKER ON 04/08/22 @ 1952 BY DRT    Neisseria meningitidis NOT DETECTED NOT DETECTED Final   Pseudomonas aeruginosa NOT DETECTED NOT DETECTED Final   Stenotrophomonas maltophilia NOT DETECTED NOT DETECTED Final   Candida albicans NOT DETECTED NOT DETECTED Final   Candida auris NOT DETECTED NOT DETECTED Final   Candida glabrata NOT DETECTED NOT  DETECTED Final   Candida krusei NOT DETECTED NOT DETECTED Final   Candida parapsilosis NOT DETECTED NOT DETECTED Final   Candida tropicalis NOT DETECTED NOT DETECTED Final   Cryptococcus neoformans/gattii NOT DETECTED NOT DETECTED Final    Comment: Performed at [facility], [address] [place], [place] [address]  Culture, blood (Routine x 2)     Status: Abnormal (Preliminary result)   Collection Time: 04/07/22  9:16 PM   Specimen: BLOOD  Result Value Ref Range Status    Specimen Description   Final    BLOOD BLOOD LEFT HAND Performed at [facility], [address] [place], [place] [address]    Special Requests   Final    BOTTLES DRAWN AEROBIC ONLY Blood Culture results may not be optimal due to an inadequate volume of blood received in culture bottles Performed at [facility], [address], [place], [place] [address]    Culture  Setup Time   Final    GRAM NEGATIVE COCCOBACILLI AEROBIC BOTTLE ONLY CRITICAL RESULT CALLED TO, READ BACK BY AND VERIFIED WITH: PHARMD MARY ASHLYN TUCKER ON 04/08/22 @ 1958 BY DRT    Culture (A)  Final    HAEMOPHILUS INFLUENZAE BETA LACTAMASE NEGATIVE HEALTH DEPARTMENT NOTIFIED Performed at [facility], [address] [place], [place] [address]    Report Status PENDING  Incomplete  Urine Culture     Status: Abnormal (Preliminary result)   Collection Time: 04/08/22  3:26 AM   Specimen: Urine, Clean Catch  Result Value Ref Range Status   Specimen Description   Final    URINE, CLEAN CATCH Performed at [facility], [address], [place], [place] [address]    Special Requests   Final    NONE Performed at [facility], [address] [place], [place] [address]    Culture (A)  Final    >=100,000 COLONIES/mL KLEBSIELLA PNEUMONIAE SUSCEPTIBILITIES TO FOLLOW Performed at [facility], [address], [place], [place] [address]    Report Status PENDING  Incomplete  CSF culture w Gram Stain     Status: None (Preliminary result)   Collection Time: 04/09/22  4:21 PM   Specimen: CSF; Cerebrospinal Fluid  Result Value Ref Range Status   Specimen Description   Final    CSF Performed at [facility], [address] [place], [place] [address]    Special Requests   Final    NONE Performed at [facility], [address], [place], [place] [address]    Gram Stain   Final    WBC PRESENT,BOTH PMN  AND MONONUCLEAR NO ORGANISMS SEEN CYTOSPIN SMEAR Gram Stain Report Called to,Read Back By and Verified With: Loetta Rough, A Rn on 04/09/22 @ 1722 by Edson Snowball STAIN REVIEWED-AGREE WITH RESULT    Culture   Final    NO GROWTH < 24 HOURS Performed at [facility], [address] [place], [place] [address]    Report Status PENDING  Incomplete     Radiology Studies: No results found.  Scheduled Meds:  acetaminophen  325 mg Oral Once   amLODipine  5 mg Oral Daily   enoxaparin (LOVENOX) injection  40 mg Subcutaneous Q24H   hydrocortisone cream   Topical TID   hydrOXYzine  25 mg Oral Once   phosphorus  500 mg Oral BID   sodium chloride flush  3 mL Intravenous Q12H   Continuous Infusions:  cefTRIAXone (ROCEPHIN)  IV     lactated ringers 100 mL/hr at 04/10/22 0939   promethazine (PHENERGAN) injection (IM or IVPB)      LOS: 1 day   Marguerita Merles, DO [facility] Available via Epic secure chat 7am-7pm After these hours, please refer to coverage provider listed on [email or web address] 04/10/2022, 3:11 PM

## 2022-04-11 DIAGNOSIS — G000 Hemophilus meningitis: Secondary | ICD-10-CM | POA: Diagnosis not present

## 2022-04-11 DIAGNOSIS — I10 Essential (primary) hypertension: Secondary | ICD-10-CM | POA: Diagnosis not present

## 2022-04-11 DIAGNOSIS — A413 Sepsis due to Hemophilus influenzae: Secondary | ICD-10-CM | POA: Diagnosis not present

## 2022-04-11 LAB — CULTURE, BLOOD (ROUTINE X 2)

## 2022-04-11 LAB — CBC WITH DIFFERENTIAL/PLATELET
Abs Immature Granulocytes: 0.08 10*3/uL — ABNORMAL HIGH (ref 0.00–0.07)
Basophils Absolute: 0 10*3/uL (ref 0.0–0.1)
Basophils Relative: 0 %
Eosinophils Absolute: 0.5 10*3/uL (ref 0.0–0.5)
Eosinophils Relative: 5 %
HCT: 33.9 % — ABNORMAL LOW (ref 36.0–46.0)
Hemoglobin: 11.2 g/dL — ABNORMAL LOW (ref 12.0–15.0)
Immature Granulocytes: 1 %
Lymphocytes Relative: 19 %
Lymphs Abs: 1.8 10*3/uL (ref 0.7–4.0)
MCH: 31.1 pg (ref 26.0–34.0)
MCHC: 33 g/dL (ref 30.0–36.0)
MCV: 94.2 fL (ref 80.0–100.0)
Monocytes Absolute: 0.6 10*3/uL (ref 0.1–1.0)
Monocytes Relative: 7 %
Neutro Abs: 6.4 10*3/uL (ref 1.7–7.7)
Neutrophils Relative %: 68 %
Platelets: 241 10*3/uL (ref 150–400)
RBC: 3.6 MIL/uL — ABNORMAL LOW (ref 3.87–5.11)
RDW: 12.6 % (ref 11.5–15.5)
WBC: 9.4 10*3/uL (ref 4.0–10.5)
nRBC: 0 % (ref 0.0–0.2)

## 2022-04-11 LAB — COMPREHENSIVE METABOLIC PANEL
ALT: 15 U/L (ref 0–44)
AST: 20 U/L (ref 15–41)
Albumin: 2.4 g/dL — ABNORMAL LOW (ref 3.5–5.0)
Alkaline Phosphatase: 74 U/L (ref 38–126)
Anion gap: 7 (ref 5–15)
BUN: 11 mg/dL (ref 8–23)
CO2: 27 mmol/L (ref 22–32)
Calcium: 8.7 mg/dL — ABNORMAL LOW (ref 8.9–10.3)
Chloride: 107 mmol/L (ref 98–111)
Creatinine, Ser: 0.7 mg/dL (ref 0.44–1.00)
GFR, Estimated: >60 mL/min (ref >60)
Glucose, Bld: 134 mg/dL — ABNORMAL HIGH (ref 70–99)
Potassium: 3 mmol/L — ABNORMAL LOW (ref 3.5–5.1)
Sodium: 141 mmol/L (ref 135–145)
Total Bilirubin: 0.5 mg/dL (ref 0.3–1.2)
Total Protein: 5.4 g/dL — ABNORMAL LOW (ref 6.5–8.1)

## 2022-04-11 LAB — VDRL, CSF: VDRL Quant, CSF: NONREACTIVE

## 2022-04-11 LAB — URINE CULTURE: Culture: >=100000 — AB

## 2022-04-11 LAB — PHOSPHORUS: Phosphorus: 2.6 mg/dL (ref 2.5–4.6)

## 2022-04-11 LAB — MAGNESIUM: Magnesium: 1.9 mg/dL (ref 1.7–2.4)

## 2022-04-11 LAB — HSV 1/2 PCR, CSF
HSV-1 DNA: NEGATIVE
HSV-2 DNA: NEGATIVE

## 2022-04-11 LAB — PROCALCITONIN: Procalcitonin: 0.23 ng/mL

## 2022-04-11 LAB — PATHOLOGIST SMEAR REVIEW

## 2022-04-11 MED ORDER — LACTATED RINGERS IV SOLN: INTRAVENOUS | Status: AC

## 2022-04-11 MED ORDER — IBUPROFEN 200 MG PO TABS
200.0000 mg | ORAL_TABLET | Freq: Once | ORAL | Status: AC
Start: 2022-04-11 — End: 2022-04-11
  Administered 2022-04-11: 200 mg via ORAL
  Filled 2022-04-11: qty 1

## 2022-04-11 MED ORDER — POTASSIUM CHLORIDE CRYS ER 20 MEQ PO TBCR
40.0000 meq | EXTENDED_RELEASE_TABLET | Freq: Two times a day (BID) | ORAL | Status: AC
Start: 2022-04-11 — End: 2022-04-11
  Administered 2022-04-11 (×2): 40 meq via ORAL
  Filled 2022-04-11 (×2): qty 2

## 2022-04-11 NOTE — TOC Progression Note (Signed)
Transition of Care [facility]) - Progression Note    Patient Details  Name: Jenny Krueger MRN: [id number] Date of Birth: 05-24-1946  Transition [facility] CM/SW Contact  Geni Bers, RN Phone Number: 04/11/2022, 3:57 PM  Clinical Narrative:    Pt is from home alone and plan to discharge home. No HH needs at present time.    Expected Discharge Plan: Home/Self Care Barriers to Discharge: No Barriers Identified  Expected Discharge Plan and Services Expected Discharge Plan: Home/Self Care     Post Acute Care Choice: Home Health Living arrangements for the past 2 months: Single Family Home                                       Social Determinants of Health (SDOH) Interventions    Readmission Risk Interventions     View : No data to display.

## 2022-04-11 NOTE — Progress Notes (Signed)
PROGRESS NOTE    Jenny Krueger  ZOX:[id number] DOB: 1946/05/16 DOA: 04/07/2022 PCP: Juluis Rainier, MD (Inactive)   Brief Narrative:  HPI per Dr. Beola Cord on 04/07/22 Jenny Krueger is a 76 y.o. female with medical history significant of hypertension, hyperlipidemia, hypothyroidism, degenerative disc disease, fibromyalgia presenting with altered mental status.  History obtained with assistance of chart review and family.  Patient had been seemingly at baseline but was noted to be altered today when visiting with family.  Has had recent sinus infections.  Earlier today was noted to have increased somnolence after taking Benadryl.  Upon waking did have episode of nausea and vomiting. Also reports some intermittent fevers, and headaches.   Denies chest pain, shortness of breath, abdominal pain, constipation, diarrhea   ED Course: Vital signs in the ED notable for fever to 101.1, tachypnea in the 20s, blood pressure in the 130s to 140s systolic.  Lab work-up included CMP with potassium 3.3, glucose 136, T. bili 1.3.  CBC with leukocytosis to 18.2.  PT and INR within normal limits.  Lactic acid normal with repeat pending.  COVID negative.  Urinalysis pending.  Blood cultures pending.  Chest x-ray without acute abnormality.  CT of the head without acute abnormality but does show sinus disease.  Patient received vancomycin and Zosyn in the ED as well as Tylenol.  She did have some improvement in her symptoms while in the ED as well.   **Interim History  Continues to have a headache and some neck pain.  Also had some nausea and vomiting and was given Compazine.  Asking for some Aleve for her head.  States that she gets headaches whenever she gets a sinus infection.  Urinalysis shows likely she has a urinary tract infection but she remains on broad-spectrum antibiotics for now and will de-escalate once cultures are resulted.   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   -Also noted to have increase somnolence after taking Benadryl. Per family, mentation has improved in ED. -Had episodes of nausea and vomiting as well noted to have fever in ED of 101.1.  Also noted to be tachypneic and have leukocytosis to 18.2.  Meeting SIRS criteria. -Source now likley 2/2 to H Influenzae Bacteremia and Klebseilla PNA as chest x-ray was clear and CT head showed sinus disease but nothing acute.   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 >Initial lactic acid normal at 1.9 -Continue to monitor on progressive unit for now -Continue Trend fever curve and white count and WBC went from 18.2 is now 18.8 -> 15.7 -> 12.0 -> 9.4 - Follow-up urinalysis, urine culture, blood cultures -Switched antibiotics to vancomycin cefepime and Flagyl and will continue for now -Received 1000 g of acetaminophen on Admission and is now on 650 mg p.o. every 6 as needed for mild pain or fever greater than 100 -Given 1  L fluid bolus and now on maintenance IV fluids with LR at 100 mL/hr and will reduce rate to 75 mL/hr and will stop IVF after 12 more hours  -Checked Procalcitonin and is now 1.43 -> 0.51 -> 0.23 -ID consulted for further evaluation and recc's and recommending obtaining LP; Discussed with Neuro and they recommending IR to do it  under Fluoro. IR wants it attempted on the Floor prior to them attempting. IR and Neuro to discuss with themselves but regardless LP needs to be done per ID Recc's  -LP done and showed RBC of 1260 on Tube 1 and  21 on Tube 4, WBC elevated of 2 1 being 195 and 2 for being 107 with segmented neutrophils being 63 and 50, glucose of 21 protein of 110 and CSF showing no WBC cultures and VDRL CSF, at bedtime V1 and 2 PCR pending -Continue to monitor and trend and follow clinical course ID recommends continuing ceftriaxone 2 g every 12 but have now changed to IV Meropenem given Rash   Acute Headache associated with Neck Pain, improving  -Does not really have any nuchal rigidity but continues to complain of posterior pain in her neck as well as head and now that she has H Influenzae Bacteremia it is concerning for Meningitis  -States that she gets headaches when she gets sinus infections and is worse on the right side -Head CT w/o Contrast showed "Atrophy and small-vessel disease with no acute intracranial CT findings. Sinus and right mastoid disease as described above" -MRI Brain w/o Contrast done and showed "No evidence of acute intracranial abnormality" and MRI of the Cervical Spine showed Multilevel degenerative changes of the cervical spine as described above. 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 bili is now 1.3 -> 1.0 -> 0.8 -> 0.5 -Continue to monitor and trend and continue with IV fluid hydration -Repeat CMP in a.m.   Hypoalbuminemia -Pateint's Albumin Level went from 3.5  -> 3.1 -> 3.0 -> 2.7 -> 2.4 -Continue to Monitor and trend  Hypertension -Continue home Amlodipine -Holding home Losartan-Hydrochlorothiazide combo in the setting of  sepsis -Continue monitor blood pressures per protocol -Last blood pressure reading was elevated at 135/67   Hypokalemia -Mild and in the setting of nausea vomiting -Potassium has gone from 3.2 -> 3.6 -> 3.3 and is now 3.0 and will replete with po Kcl 40 mEQ twice daily x2 doses -Continue to monitor and replete as necessary -Repeat CMP in a.m.   Rash -Noted to be likely a drug reaction given that she has multiple allergies be on her chest and will start Benadryl for her itching as well as hydrocortisone cream -Unclear if this is related to her meningitis -Continue to monitor and if necessary may need systemic steroids -Antibiotics have been changed from ceftriaxone to IV meropenem   Hypophosphatemia -Patient's Phos level was 2.1 is now 2.6 -Replete with p.o. K-Phos Neutral 500 mg p.o. twice daily x2 doses yesterday -Continue to monitor and replete as necessary -Repeat Phos in a.m.   Obesity -Complicates overall prognosis and  care -Estimated body mass index is 33.16 kg/m as calculated from the following:   Height as of this encounter: 5\' 5"  (1.651 m).   Weight as of this encounter: 90.4 kg.  -Weight Loss and Dietary Counseling given  DVT prophylaxis: enoxaparin (LOVENOX) injection 40 mg Start: 04/08/22 1000    Code Status: Full Code Family Communication: Discussed with son at bedside   Disposition Plan:  Level of care: Progressive Status is: Inpatient Remains inpatient appropriate because: Has meningitis and needs ID clearance prior to having evaluation for safe discharge disposition   Consultants:  Infectious Diseases Neurology for LP  Procedures:  LP  Antimicrobials:  Anti-infectives (From admission, onward)    Start     Dose/Rate Route Frequency Ordered Stop   04/10/22 2200  cefTRIAXone  (ROCEPHIN) 2 g in sodium chloride 0.9 % 100 mL IVPB  Status:  Discontinued        2 g 200 mL/hr over 30 Minutes Intravenous Every 12 hours 04/10/22 1011 04/10/22 1606   04/10/22 1700  meropenem (MERREM) 2 g in sodium chloride 0.9 % 100 mL IVPB        2 g 280 mL/hr over 30 Minutes Intravenous Every 8 hours 04/10/22 1606     04/08/22 2200  vancomycin (VANCOCIN) IVPB 1000 mg/200 mL premix  Status:  Discontinued        1,000 mg 200 mL/hr over 60 Minutes Intravenous Every 24 hours 04/08/22 0024 04/08/22 2011   04/08/22 2200  cefTRIAXone (ROCEPHIN) 2 g in sodium chloride 0.9 % 100 mL IVPB  Status:  Discontinued        2 g 200 mL/hr over 30 Minutes Intravenous Every 24 hours 04/08/22 2011 04/10/22 1011   04/08/22 0600  ceFEPIme (MAXIPIME) 2 g in sodium chloride 0.9 % 100 mL IVPB  Status:  Discontinued        2 g 200 mL/hr over 30 Minutes Intravenous Every 8 hours 04/08/22 0024 04/08/22 2011   04/08/22 0015  metroNIDAZOLE (FLAGYL) IVPB 500 mg  Status:  Discontinued        500 mg 100 mL/hr over 60 Minutes Intravenous Every 12 hours 04/08/22 0008 04/08/22 2011   04/07/22 2230  piperacillin-tazobactam (ZOSYN) IVPB 3.375 g        3.375 g 100 mL/hr over 30 Minutes Intravenous  Once 04/07/22 2224 04/07/22 2318   04/07/22 2230  vancomycin (VANCOCIN) IVPB 1000 mg/200 mL premix        1,000 mg 200 mL/hr over 60 Minutes Intravenous  Once 04/07/22 2224 04/08/22 0049       Subjective: Examined at bedside and thinks she is doing okay.  Feels much better and denies any nausea or vomiting but had some head pressure today.  Denied any headache but feels like she had some pressure on her frontal sinus.  States that she slept okay besides her being woken up by the phlebotomy team to take blood.  Denies any other concerns or complaints at this time.  Objective: Vitals:   04/10/22 0405 04/10/22 1300 04/10/22 2032 04/11/22 1200  BP: (!) 152/66 138/64 (!) 150/65 135/67  Pulse: 75 (!) 59 66 (!) 57  Resp: 20 20  20 19   Temp: 99.4 F (37.4 C) 98.2 F (36.8 C) 99.6 F (37.6 C) 98.3 F (36.8 C)  TempSrc: Oral Oral Oral Oral  SpO2: 100% 98% 100% 100%  Weight:      Height:        Intake/Output Summary (Last 24 hours) at 04/11/2022 1300 Last  data filed at 04/11/2022 0300 Gross per 24 hour  Intake 1880.96 ml  Output --  Net 1880.96 ml   Filed Weights   04/07/22 2055 04/09/22 0400  Weight: 82 kg 90.4 kg   Examination: Physical Exam:  Constitutional: WN/WD obese Caucasian female currently no acute distress Respiratory: Diminished to auscultation bilaterally with coarse breath sounds, no wheezing, rales, rhonchi or crackles. Normal respiratory effort and patient is not tachypenic. No accessory muscle use.  Unlabored today Cardiovascular: RRR, no murmurs / rubs / gallops. S1 and S2 auscultated.  Abdomen: Soft, non-tender, distended secondary body habitus. Bowel sounds positive.  GU: Deferred. Musculoskeletal: No clubbing / cyanosis of digits/nails. No joint deformity upper and lower extremities.  Skin: Has a splotchy rash on her upper chest that is itchy and erythematous Neurologic: CN 2-12 grossly intact with no focal deficits. Romberg sign and cerebellar reflexes not assessed.  Psychiatric: Normal judgment and insight. Alert and oriented x 3. Normal mood and appropriate affect.   Data Reviewed: I have personally reviewed following labs and imaging studies  CBC: Recent Labs  Lab 04/07/22 2105 04/08/22 0557 04/09/22 0914 04/10/22 0916 04/11/22 0415  WBC 18.2* 18.8* 15.7* 12.0* 9.4  NEUTROABS 15.8*  --  13.0* 9.1* 6.4  HGB 14.3 12.6 12.2 12.1 11.2*  HCT 42.1 37.7 36.4 35.3* 33.9*  MCV 93.6 93.8 95.3 93.6 94.2  PLT 283 266 251 250 241   Basic Metabolic Panel: Recent Labs  Lab 04/07/22 2105 04/08/22 0557 04/09/22 0914 04/10/22 0916 04/11/22 0415  NA 140 138 138 139 141  K 3.3* 3.2* 3.6 3.3* 3.0*  CL 104 104 105 103 107  CO2 GLUCOSE 136* 152* 121* 122* 134*  BUN  CREATININE 0.75 0.66 0.51 0.51 0.70  CALCIUM 9.3 8.8* 9.4 8.9 8.7*  MG  --   --  2.2 2.3 1.9  PHOS  --   --  2.1* 2.1* 2.6   GFR: Estimated Creatinine Clearance: 68.6 mL/min (by C-G formula based on SCr of 0.7 mg/dL). Liver Function Tests: Recent Labs  Lab 04/07/22 2105 04/08/22 0557 04/09/22 0914 04/10/22 0916 04/11/22 0415  AST ALT ALKPHOS 101 86 91 84 74  BILITOT 1.3* 1.3* 1.0 0.8 0.5  PROT 7.4 6.5 6.7 6.2* 5.4*  ALBUMIN 3.5 3.1* 3.0* 2.7* 2.4*   No results for input(s): LIPASE, AMYLASE in the last 168 hours. No results for input(s): AMMONIA in the last 168 hours. Coagulation Profile: Recent Labs  Lab 04/07/22 2105 04/08/22 0557  INR 1.0 1.2   Cardiac Enzymes: No results for input(s): CKTOTAL, CKMB, CKMBINDEX, TROPONINI in the last 168 hours. BNP (last 3 results) No results for input(s): PROBNP in the last 8760 hours. HbA1C: No results for input(s): HGBA1C in the last 72 hours. CBG: Recent Labs  Lab 04/09/22 1851  GLUCAP 88   Lipid Profile: No results for input(s): CHOL, HDL, LDLCALC, TRIG, CHOLHDL, LDLDIRECT in the last 72 hours. Thyroid Function Tests: No results for input(s): TSH, T4TOTAL, FREET4, T3FREE, THYROIDAB in the last 72 hours. Anemia Panel: No results for input(s): VITAMINB12, FOLATE, FERRITIN, TIBC, IRON, RETICCTPCT in the last 72 hours. Sepsis Labs: Recent Labs  Lab 04/07/22 2105 04/08/22 0557 04/10/22 0920 04/11/22 0415  PROCALCITON  --  1.43 0.51 0.23  LATICACIDVEN 1.9  --   --   --     Recent Results (from the past 240  hour(s))  SARS Coronavirus 2 by RT PCR (hospital order, performed in [facility] hospital lab) *cepheid single result test*     Status: None   Collection Time: 04/07/22  9:02 PM   Specimen: Nasal Swab  Result Value Ref Range Status   SARS Coronavirus 2 by RT PCR NEGATIVE NEGATIVE Final    Comment: (NOTE) SARS-CoV-2 target nucleic acids are NOT DETECTED.  The  SARS-CoV-2 RNA is generally detectable in upper and lower respiratory specimens during the acute phase of infection. The lowest concentration of SARS-CoV-2 viral copies this assay can detect is 250 copies / mL. A negative result does not preclude SARS-CoV-2 infection and should not be used as the sole basis for treatment or other patient management decisions.  A negative result may occur with improper specimen collection / handling, submission of specimen other than nasopharyngeal swab, presence of viral mutation(s) within the areas targeted by this assay, and inadequate number of viral copies (<250 copies / mL). A negative result must be combined with clinical observations, patient history, and epidemiological information.  Fact Sheet for Patients:   RoadLapTop.co.zahttps://www.fda.gov/media/158405/download  Fact Sheet for Healthcare Providers: http://kim-miller.com/https://www.fda.gov/media/158404/download  This test is not yet approved or  cleared by the [place] FDA and has been authorized for detection and/or diagnosis of SARS-CoV-2 by FDA under an Emergency Use Authorization (EUA).  This EUA will remain in effect (meaning this test can be used) for the duration of the COVID-19 declaration under Section 564(b)(1) of the Act, 21 U.S.C. section 360bbb-3(b)(1), unless the authorization is terminated or revoked sooner.  Performed at [facility], [address] [place], [place] [address]   Culture, blood (Routine x 2)     Status: Abnormal (Preliminary result)   Collection Time: 04/07/22  9:05 PM   Specimen: BLOOD  Result Value Ref Range Status   Specimen Description   Final    BLOOD LEFT ANTECUBITAL Performed at [facility], [address] [place], [place] [address]    Special Requests   Final    BOTTLES DRAWN AEROBIC AND ANAEROBIC Blood Culture adequate volume Performed at [facility], [address] [place], [place] [address]    Culture  Setup  Time   Final    GRAM NEGATIVE COCCOBACILLI IN BOTH AEROBIC AND ANAEROBIC BOTTLES CRITICAL RESULT CALLED TO, READ BACK BY AND VERIFIED WITH: MARY ASHLYN TUCKER ON 04/08/22 @ 1952 BY DRT    Culture (A)  Final    HAEMOPHILUS INFLUENZAE BETA LACTAMASE NEGATIVE HEALTH DEPARTMENT NOTIFIED Referred to [facility] in [place], [place] for serotyping. Performed at [facility], [address] [place], [place] [address]    Report Status PENDING  Incomplete  Blood Culture ID Panel (Reflexed)     Status: Abnormal   Collection Time: 04/07/22  9:05 PM  Result Value Ref Range Status   Enterococcus faecalis NOT DETECTED NOT DETECTED Final   Enterococcus Faecium NOT DETECTED NOT DETECTED Final   Listeria monocytogenes NOT DETECTED NOT DETECTED Final   Staphylococcus species NOT DETECTED NOT DETECTED Final   Staphylococcus aureus (BCID) NOT DETECTED NOT DETECTED Final   Staphylococcus epidermidis NOT DETECTED NOT DETECTED Final   Staphylococcus lugdunensis NOT DETECTED NOT DETECTED Final   Streptococcus species NOT DETECTED NOT DETECTED Final   Streptococcus agalactiae NOT DETECTED NOT DETECTED Final   Streptococcus pneumoniae NOT DETECTED NOT DETECTED Final   Streptococcus pyogenes NOT DETECTED NOT DETECTED Final   A.calcoaceticus-baumannii NOT DETECTED NOT DETECTED Final  Bacteroides fragilis NOT DETECTED NOT DETECTED Final   Enterobacterales NOT DETECTED NOT DETECTED Final   Enterobacter cloacae complex NOT DETECTED NOT DETECTED Final   Escherichia coli NOT DETECTED NOT DETECTED Final   Klebsiella aerogenes NOT DETECTED NOT DETECTED Final   Klebsiella oxytoca NOT DETECTED NOT DETECTED Final   Klebsiella pneumoniae NOT DETECTED NOT DETECTED Final   Proteus species NOT DETECTED NOT DETECTED Final   Salmonella species NOT DETECTED NOT DETECTED Final   Serratia marcescens NOT DETECTED NOT DETECTED Final   Haemophilus influenzae DETECTED (A) NOT DETECTED Final    Comment: CRITICAL RESULT  CALLED TO, READ BACK BY AND VERIFIED WITH: MARY ASHLYN TUCKER ON 04/08/22 @ 1952 BY DRT    Neisseria meningitidis NOT DETECTED NOT DETECTED Final   Pseudomonas aeruginosa NOT DETECTED NOT DETECTED Final   Stenotrophomonas maltophilia NOT DETECTED NOT DETECTED Final   Candida albicans NOT DETECTED NOT DETECTED Final   Candida auris NOT DETECTED NOT DETECTED Final   Candida glabrata NOT DETECTED NOT DETECTED Final   Candida krusei NOT DETECTED NOT DETECTED Final   Candida parapsilosis NOT DETECTED NOT DETECTED Final   Candida tropicalis NOT DETECTED NOT DETECTED Final   Cryptococcus neoformans/gattii NOT DETECTED NOT DETECTED Final    Comment: Performed at [facility], [address] [place], [place] [address]  Culture, blood (Routine x 2)     Status: Abnormal   Collection Time: 04/07/22  9:16 PM   Specimen: BLOOD  Result Value Ref Range Status   Specimen Description   Final    BLOOD BLOOD LEFT HAND Performed at [facility], [address], [place], [place] [address]    Special Requests   Final    BOTTLES DRAWN AEROBIC ONLY Blood Culture results may not be optimal due to an inadequate volume of blood received in culture bottles Performed at [facility], [address], [place], [place] [address]    Culture  Setup Time   Final    GRAM NEGATIVE COCCOBACILLI AEROBIC BOTTLE ONLY CRITICAL RESULT CALLED TO, READ BACK BY AND VERIFIED WITH: PHARMD MARY ASHLYN TUCKER ON 04/08/22 @ 1958 BY DRT    Culture (A)  Final    HAEMOPHILUS INFLUENZAE BETA LACTAMASE NEGATIVE HEALTH DEPARTMENT NOTIFIED Performed at [facility], [address], [place], [place] [address]    Report Status 04/11/2022 FINAL  Final  Urine Culture     Status: Abnormal   Collection Time: 04/08/22  3:26 AM   Specimen: Urine, Clean Catch  Result Value Ref Range Status   Specimen Description   Final    URINE, CLEAN CATCH Performed at [facility], [address], [place], [place] [address]    Special Requests   Final    NONE Performed at [facility], [address] [place], [place] [address]    Culture >=100,000 COLONIES/mL KLEBSIELLA PNEUMONIAE (A)  Final   Report Status 04/11/2022 FINAL  Final   Organism ID, Bacteria KLEBSIELLA PNEUMONIAE (A)  Final      Susceptibility   Klebsiella pneumoniae - MIC*    AMPICILLIN >=32 RESISTANT Resistant     CEFAZOLIN <=4 SENSITIVE Sensitive     CEFEPIME <=0.12 SENSITIVE Sensitive     CEFTRIAXONE <=0.25 SENSITIVE Sensitive     CIPROFLOXACIN <=0.25 SENSITIVE Sensitive     GENTAMICIN <=1 SENSITIVE Sensitive     IMIPENEM <=0.25 SENSITIVE Sensitive     NITROFURANTOIN <=16 SENSITIVE Sensitive  TRIMETH/SULFA <=20 SENSITIVE Sensitive     AMPICILLIN/SULBACTAM 16 INTERMEDIATE Intermediate     PIP/TAZO <=4 SENSITIVE Sensitive     * >=100,000 COLONIES/mL KLEBSIELLA PNEUMONIAE  CSF culture w Gram Stain     Status: None (Preliminary result)   Collection Time: 04/09/22  4:21 PM   Specimen: CSF; Cerebrospinal Fluid  Result Value Ref Range Status   Specimen Description   Final    CSF Performed at [facility], [address], [place], [place] [address]    Special Requests   Final    NONE Performed at [facility], [address] [place], [place] [address]    Gram Stain   Final    WBC PRESENT,BOTH PMN AND MONONUCLEAR NO ORGANISMS SEEN CYTOSPIN SMEAR Gram Stain Report Called to,Read Back By and Verified With: Loetta Rough, A Rn on 04/09/22 @ 1722 by Edson Snowball STAIN REVIEWED-AGREE WITH RESULT    Culture   Final    NO GROWTH 2 DAYS Performed at [facility], [address], [place], [place] [address]    Report Status PENDING  Incomplete     Radiology Studies: No results found.   Scheduled Meds:  acetaminophen  325 mg Oral Once   amLODipine  5 mg Oral Daily   enoxaparin (LOVENOX) injection  40 mg Subcutaneous Q24H   hydrocortisone  cream   Topical TID   hydrOXYzine  25 mg Oral Once   potassium chloride  40 mEq Oral BID   sodium chloride flush  3 mL Intravenous Q12H   Continuous Infusions:  lactated ringers 75 mL/hr at 04/11/22 1244   meropenem (MERREM) IV 2 g (04/11/22 0942)   promethazine (PHENERGAN) injection (IM or IVPB)      LOS: 2 days   Marguerita Merles, DO [facility] Available via Epic secure chat 7am-7pm After these hours, please refer to coverage provider listed on [email or web address] 04/11/2022, 1:00 PM

## 2022-04-11 NOTE — Progress Notes (Signed)
[facility]   Reason for visit: Follow up on meningitis  Interval History: she had a headache this am but improved after acetaminophen she reports.  Son at bedside.    Physical Exam: Constitutional:  Vitals:   04/10/22 2032 04/11/22 1200  BP: (!) 150/65 135/67  Pulse: 66 (!) 57  Resp: 20 19  Temp: 99.6 F (37.6 C) 98.3 F (36.8 C)  SpO2: 100% 100%   patient appears in NAD Respiratory: Normal respiratory effort; CTA B GI: soft, nt, nd  Review of Systems: Constitutional: negative for fevers and chills  Lab Results  Component Value Date   WBC 9.4 04/11/2022   HGB 11.2 (L) 04/11/2022   HCT 33.9 (L) 04/11/2022   MCV 94.2 04/11/2022   PLT 241 04/11/2022    Lab Results  Component Value Date   CREATININE 0.70 04/11/2022   BUN 11 04/11/2022   NA 141 04/11/2022   K 3.0 (L) 04/11/2022   CL 107 04/11/2022   CO2 27 04/11/2022    Lab Results  Component Value Date   ALT 15 04/11/2022   AST 20 04/11/2022   ALKPHOS 74 04/11/2022     Microbiology: Recent Results (from the past 240 hour(s))  SARS Coronavirus 2 by RT PCR (hospital order, performed in [facility] hospital lab) *cepheid single result test*     Status: None   Collection Time: 04/07/22  9:02 PM   Specimen: Nasal Swab  Result Value Ref Range Status   SARS Coronavirus 2 by RT PCR NEGATIVE NEGATIVE Final    Comment: (NOTE) SARS-CoV-2 target nucleic acids are NOT DETECTED.  The SARS-CoV-2 RNA is generally detectable in upper and lower respiratory specimens during the acute phase of infection. The lowest concentration of SARS-CoV-2 viral copies this assay can detect is 250 copies / mL. A negative result does not preclude SARS-CoV-2 infection and should not be used as the sole basis for treatment or other patient management decisions.  A negative result may occur with improper specimen collection / handling, submission of specimen other than nasopharyngeal swab, presence of viral  mutation(s) within the areas targeted by this assay, and inadequate number of viral copies (<250 copies / mL). A negative result must be combined with clinical observations, patient history, and epidemiological information.  Fact Sheet for Patients:   RoadLapTop.co.za  Fact Sheet for Healthcare Providers: http://kim-miller.com/  This test is not yet approved or  cleared by the [place] FDA and has been authorized for detection and/or diagnosis of SARS-CoV-2 by FDA under an Emergency Use Authorization (EUA).  This EUA will remain in effect (meaning this test can be used) for the duration of the COVID-19 declaration under Section 564(b)(1) of the Act, 21 U.S.C. section 360bbb-3(b)(1), unless the authorization is terminated or revoked sooner.  Performed at [facility], [address] [place], [place] [address]   Culture, blood (Routine x 2)     Status: Abnormal (Preliminary result)   Collection Time: 04/07/22  9:05 PM   Specimen: BLOOD  Result Value Ref Range Status   Specimen Description   Final    BLOOD LEFT ANTECUBITAL Performed at [facility], [address], [place], [place] [address]    Special Requests   Final    BOTTLES DRAWN AEROBIC AND ANAEROBIC Blood Culture adequate volume Performed at [facility], [address] [place], [place] [address]    Culture  Setup Time   Final    Romie Minus  NEGATIVE COCCOBACILLI IN BOTH AEROBIC AND ANAEROBIC BOTTLES CRITICAL RESULT CALLED TO, READ BACK BY AND VERIFIED WITH: MARY ASHLYN TUCKER ON 04/08/22 @ 1952 BY DRT    Culture (A)  Final    HAEMOPHILUS INFLUENZAE BETA LACTAMASE NEGATIVE HEALTH DEPARTMENT NOTIFIED Referred to [facility] in [place], [place] for serotyping. Performed at [facility], [address], [place], [place] [address]    Report Status PENDING  Incomplete  Blood Culture ID Panel (Reflexed)     Status:  Abnormal   Collection Time: 04/07/22  9:05 PM  Result Value Ref Range Status   Enterococcus faecalis NOT DETECTED NOT DETECTED Final   Enterococcus Faecium NOT DETECTED NOT DETECTED Final   Listeria monocytogenes NOT DETECTED NOT DETECTED Final   Staphylococcus species NOT DETECTED NOT DETECTED Final   Staphylococcus aureus (BCID) NOT DETECTED NOT DETECTED Final   Staphylococcus epidermidis NOT DETECTED NOT DETECTED Final   Staphylococcus lugdunensis NOT DETECTED NOT DETECTED Final   Streptococcus species NOT DETECTED NOT DETECTED Final   Streptococcus agalactiae NOT DETECTED NOT DETECTED Final   Streptococcus pneumoniae NOT DETECTED NOT DETECTED Final   Streptococcus pyogenes NOT DETECTED NOT DETECTED Final   A.calcoaceticus-baumannii NOT DETECTED NOT DETECTED Final   Bacteroides fragilis NOT DETECTED NOT DETECTED Final   Enterobacterales NOT DETECTED NOT DETECTED Final   Enterobacter cloacae complex NOT DETECTED NOT DETECTED Final   Escherichia coli NOT DETECTED NOT DETECTED Final   Klebsiella aerogenes NOT DETECTED NOT DETECTED Final   Klebsiella oxytoca NOT DETECTED NOT DETECTED Final   Klebsiella pneumoniae NOT DETECTED NOT DETECTED Final   Proteus species NOT DETECTED NOT DETECTED Final   Salmonella species NOT DETECTED NOT DETECTED Final   Serratia marcescens NOT DETECTED NOT DETECTED Final   Haemophilus influenzae DETECTED (A) NOT DETECTED Final    Comment: CRITICAL RESULT CALLED TO, READ BACK BY AND VERIFIED WITH: MARY ASHLYN TUCKER ON 04/08/22 @ 1952 BY DRT    Neisseria meningitidis NOT DETECTED NOT DETECTED Final   Pseudomonas aeruginosa NOT DETECTED NOT DETECTED Final   Stenotrophomonas maltophilia NOT DETECTED NOT DETECTED Final   Candida albicans NOT DETECTED NOT DETECTED Final   Candida auris NOT DETECTED NOT DETECTED Final   Candida glabrata NOT DETECTED NOT DETECTED Final   Candida krusei NOT DETECTED NOT DETECTED Final   Candida parapsilosis NOT DETECTED NOT  DETECTED Final   Candida tropicalis NOT DETECTED NOT DETECTED Final   Cryptococcus neoformans/gattii NOT DETECTED NOT DETECTED Final    Comment: Performed at [facility], [address], [place], [place] [address]  Culture, blood (Routine x 2)     Status: Abnormal   Collection Time: 04/07/22  9:16 PM   Specimen: BLOOD  Result Value Ref Range Status   Specimen Description   Final    BLOOD BLOOD LEFT HAND Performed at [facility], [address] [place], [place] [address]    Special Requests   Final    BOTTLES DRAWN AEROBIC ONLY Blood Culture results may not be optimal due to an inadequate volume of blood received in culture bottles Performed at [facility], [address] [place], [place] [address]    Culture  Setup Time   Final    GRAM NEGATIVE COCCOBACILLI AEROBIC BOTTLE ONLY CRITICAL RESULT CALLED TO, READ BACK BY AND VERIFIED WITH: PHARMD MARY ASHLYN TUCKER ON 04/08/22 @ 1958 BY DRT    Culture (A)  Final    HAEMOPHILUS INFLUENZAE BETA LACTAMASE NEGATIVE HEALTH  DEPARTMENT NOTIFIED Performed at [facility], [address] [place], [place] [address]    Report Status 04/11/2022 FINAL  Final  Urine Culture     Status: Abnormal   Collection Time: 04/08/22  3:26 AM   Specimen: Urine, Clean Catch  Result Value Ref Range Status   Specimen Description   Final    URINE, CLEAN CATCH Performed at [facility], [address] [place], [place] [address]    Special Requests   Final    NONE Performed at [facility], [address] [place], [place] [address]    Culture >=100,000 COLONIES/mL KLEBSIELLA PNEUMONIAE (A)  Final   Report Status 04/11/2022 FINAL  Final   Organism ID, Bacteria KLEBSIELLA PNEUMONIAE (A)  Final      Susceptibility   Klebsiella pneumoniae - MIC*    AMPICILLIN >=32 RESISTANT Resistant     CEFAZOLIN <=4 SENSITIVE Sensitive     CEFEPIME <=0.12 SENSITIVE Sensitive      CEFTRIAXONE <=0.25 SENSITIVE Sensitive     CIPROFLOXACIN <=0.25 SENSITIVE Sensitive     GENTAMICIN <=1 SENSITIVE Sensitive     IMIPENEM <=0.25 SENSITIVE Sensitive     NITROFURANTOIN <=16 SENSITIVE Sensitive     TRIMETH/SULFA <=20 SENSITIVE Sensitive     AMPICILLIN/SULBACTAM 16 INTERMEDIATE Intermediate     PIP/TAZO <=4 SENSITIVE Sensitive     * >=100,000 COLONIES/mL KLEBSIELLA PNEUMONIAE  CSF culture w Gram Stain     Status: None (Preliminary result)   Collection Time: 04/09/22  4:21 PM   Specimen: CSF; Cerebrospinal Fluid  Result Value Ref Range Status   Specimen Description   Final    CSF Performed at [facility], [address] [place], [place] [address]    Special Requests   Final    NONE Performed at [facility], [address] [place], [place] [address]    Gram Stain   Final    WBC PRESENT,BOTH PMN AND MONONUCLEAR NO ORGANISMS SEEN CYTOSPIN SMEAR Gram Stain Report Called to,Read Back By and Verified With: Loetta RoughKarpinia, A Rn on 04/09/22 @ 1722 by Edson SnowballGolsonM GRAM STAIN REVIEWED-AGREE WITH RESULT    Culture   Final    NO GROWTH 2 DAYS Performed at [facility], [address] [place], [place] [address]    Report Status PENDING  Incomplete    Impression/Plan:  1. H influenza meningitis with bacteremia - blood cultures positive in 3/3 bottles from 2 sets and lumbar puncture with 107/195 WBCs most c/w H influenza meningitis.   Continue meropenem, will transition to oral levaquin when ready for discharge by the primary team.   2.  Asymptomatic bacteruria - she has no urinary complaints c/w AB.  No indication for treatment.   3.  Headache - improved after pain medication.

## 2022-04-12 DIAGNOSIS — I10 Essential (primary) hypertension: Secondary | ICD-10-CM | POA: Diagnosis not present

## 2022-04-12 DIAGNOSIS — G000 Hemophilus meningitis: Secondary | ICD-10-CM | POA: Diagnosis not present

## 2022-04-12 DIAGNOSIS — A413 Sepsis due to Hemophilus influenzae: Secondary | ICD-10-CM | POA: Diagnosis not present

## 2022-04-12 LAB — CBC WITH DIFFERENTIAL/PLATELET
Abs Immature Granulocytes: 0.12 10*3/uL — ABNORMAL HIGH (ref 0.00–0.07)
Basophils Absolute: 0 10*3/uL (ref 0.0–0.1)
Basophils Relative: 0 %
Eosinophils Absolute: 0.4 10*3/uL (ref 0.0–0.5)
Eosinophils Relative: 5 %
HCT: 35.1 % — ABNORMAL LOW (ref 36.0–46.0)
Hemoglobin: 11.9 g/dL — ABNORMAL LOW (ref 12.0–15.0)
Immature Granulocytes: 2 %
Lymphocytes Relative: 14 %
Lymphs Abs: 1.1 10*3/uL (ref 0.7–4.0)
MCH: 31.6 pg (ref 26.0–34.0)
MCHC: 33.9 g/dL (ref 30.0–36.0)
MCV: 93.4 fL (ref 80.0–100.0)
Monocytes Absolute: 0.6 10*3/uL (ref 0.1–1.0)
Monocytes Relative: 8 %
Neutro Abs: 5.7 10*3/uL (ref 1.7–7.7)
Neutrophils Relative %: 71 %
Platelets: 246 10*3/uL (ref 150–400)
RBC: 3.76 MIL/uL — ABNORMAL LOW (ref 3.87–5.11)
RDW: 12.5 % (ref 11.5–15.5)
WBC: 7.9 10*3/uL (ref 4.0–10.5)
nRBC: 0 % (ref 0.0–0.2)

## 2022-04-12 LAB — LD, BODY FLUID (OTHER): LD, Body Fluid: 222 IU/L

## 2022-04-12 LAB — COMPREHENSIVE METABOLIC PANEL
ALT: 21 U/L (ref 0–44)
AST: 29 U/L (ref 15–41)
Albumin: 2.6 g/dL — ABNORMAL LOW (ref 3.5–5.0)
Alkaline Phosphatase: 90 U/L (ref 38–126)
Anion gap: 5 (ref 5–15)
BUN: 8 mg/dL (ref 8–23)
CO2: 27 mmol/L (ref 22–32)
Calcium: 8.7 mg/dL — ABNORMAL LOW (ref 8.9–10.3)
Chloride: 106 mmol/L (ref 98–111)
Creatinine, Ser: 0.55 mg/dL (ref 0.44–1.00)
GFR, Estimated: >60 mL/min (ref >60)
Glucose, Bld: 107 mg/dL — ABNORMAL HIGH (ref 70–99)
Potassium: 3.9 mmol/L (ref 3.5–5.1)
Sodium: 138 mmol/L (ref 135–145)
Total Bilirubin: 0.7 mg/dL (ref 0.3–1.2)
Total Protein: 5.9 g/dL — ABNORMAL LOW (ref 6.5–8.1)

## 2022-04-12 LAB — PROCALCITONIN: Procalcitonin: 0.1 ng/mL

## 2022-04-12 LAB — MAGNESIUM: Magnesium: 2.2 mg/dL (ref 1.7–2.4)

## 2022-04-12 LAB — PHOSPHORUS: Phosphorus: 2.1 mg/dL — ABNORMAL LOW (ref 2.5–4.6)

## 2022-04-12 MED ORDER — MELATONIN 3 MG PO TABS
3.0000 mg | ORAL_TABLET | Freq: Once | ORAL | Status: AC
  Administered 2022-04-12: 3 mg via ORAL
  Filled 2022-04-12: qty 1

## 2022-04-12 MED ORDER — OXYCODONE HCL 5 MG PO TABS: 5.0000 mg | ORAL_TABLET | Freq: Four times a day (QID) | ORAL | Status: DC | PRN

## 2022-04-12 MED ORDER — KETOROLAC TROMETHAMINE 15 MG/ML IJ SOLN
30.0000 mg | Freq: Three times a day (TID) | INTRAMUSCULAR | Status: AC | PRN
  Administered 2022-04-12 – 2022-04-13 (×3): 30 mg via INTRAVENOUS
  Filled 2022-04-12 (×4): qty 2

## 2022-04-12 MED ORDER — KETOROLAC TROMETHAMINE 15 MG/ML IJ SOLN
15.0000 mg | Freq: Once | INTRAMUSCULAR | Status: AC
  Administered 2022-04-12: 15 mg via INTRAVENOUS
  Filled 2022-04-12: qty 1

## 2022-04-12 NOTE — Progress Notes (Signed)
PROGRESS NOTE    Jenny Krueger  ASN:[id number] DOB: [birth year] DOA: 04/07/2022 PCP: Juluis Rainier, MD (Inactive)   Brief Narrative:  HPI per Dr. Beola Cord on 04/07/22 Jenny Krueger is a 76 y.o. female with medical history significant of hypertension, hyperlipidemia, hypothyroidism, degenerative disc disease, fibromyalgia presenting with altered mental status.  History obtained with assistance of chart review and family.  Patient had been seemingly at baseline but was noted to be altered today when visiting with family.  Has had recent sinus infections.  Earlier today was noted to have increased somnolence after taking Benadryl.  Upon waking did have episode of nausea and vomiting. Also reports some intermittent fevers, and headaches.   Denies chest pain, shortness of breath, abdominal pain, constipation, diarrhea   ED Course: Vital signs in the ED notable for fever to 101.1, tachypnea in the 20s, blood pressure in the 130s to 140s systolic.  Lab work-up included CMP with potassium 3.3, glucose 136, T. bili 1.3.  CBC with leukocytosis to 18.2.  PT and INR within normal limits.  Lactic acid normal with repeat pending.  COVID negative.  Urinalysis pending.  Blood cultures pending.  Chest x-ray without acute abnormality.  CT of the head without acute abnormality but does show sinus disease.  Patient received vancomycin and Zosyn in the ED as well as Tylenol.  She did have some improvement in her symptoms while in the ED as well.   **Interim History  Continues to have a headache and some neck pain.  Also had some nausea and vomiting and was given Compazine.  Asking for some Aleve for her head.  States that she gets headaches whenever she gets a sinus infection.  Urinalysis shows likely she has a urinary tract infection but she remains on broad-spectrum antibiotics for now and will de-escalate once cultures are resulted.   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   -Also noted to have increase somnolence after taking Benadryl. Per family, mentation has improved in ED. -Had episodes of nausea and vomiting as well noted to have fever in ED of 101.1.  Also noted to be tachypneic and have leukocytosis to 18.2.  Meeting SIRS criteria. -Source now likley 2/2 to H Influenzae Bacteremia and Klebseilla PNA as chest x-ray was clear and CT head showed sinus disease but nothing acute.   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 >Initial lactic acid normal at 1.9 -Continue to monitor on progressive unit for now -Continue Trend fever curve and white count and WBC went from 18.2 is now 18.8 -> 15.7 -> 12.0 -> 9.4 -> 7.9 -Switched antibiotics to vancomycin cefepime and Flagyl and will continue for now -Received 1000 g of acetaminophen on Admission and is now on 650 mg p.o. every 6 as needed for mild pain or fever greater than 100 -Given 1 L fluid bolus and now  on maintenance IV fluids with LR at 100 mL/hr and will reduce rate to 75 mL/hr and will stop IVF after 12 more hours  -Checked Procalcitonin and is now 1.43 -> 0.51 -> 0.23 and is now 0.10 -ID consulted for further evaluation and recc's and recommending obtaining LP; Discussed with Neuro and they recommending IR to do it under Fluoro. IR wants it attempted  on the Floor prior to them attempting. IR and Neuro to discuss with themselves but regardless LP needs to be done per ID Recc's  -LP done and showed RBC of 1260 on Tube 1 and  21 on Tube 4, WBC elevated of 2 1 being 195 and 2 for being 107 with segmented neutrophils being 63 and 50, glucose of 21 protein of 110 and CSF showing no WBC cultures and VDRL CSF, at bedtime V1 and 2 PCR pending -Continue to monitor and trend and follow clinical course ID recommends continuing ceftriaxone 2 g every 12 but have now changed to IV Meropenem given Rash; ID consulted recommending Oral Levofloxacin 500 mg po Daily to complete 14 Days of Abx    Acute Headache associated with Neck Pain, improving  -Does not really have any nuchal rigidity but continues to complain of posterior pain in her neck as well as head and now that she has H Influenzae Bacteremia it is concerning for Meningitis  -States that she gets headaches when she gets sinus infections and is worse on the right side -Head CT w/o Contrast showed "Atrophy and small-vessel disease with no acute intracranial CT findings. Sinus and right mastoid disease as described above" -MRI Brain w/o Contrast done and showed "No evidence of acute intracranial abnormality" and MRI of the Cervical Spine showed Multilevel degenerative changes of the cervical spine as described above. 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 bili is now 1.3 -> 1.0 -> 0.8 -> 0.5 and is now  0.7 -Continue to monitor and trend and continue with IV fluid hydration -Repeat CMP in a.m.   Hypoalbuminemia -Pateint's Albumin Level went from 3.5 -> 3.1 -> 3.0 -> 2.7 -> 2.4 -Continue to Monitor and trend  Hypertension -Continue home Amlodipine -Holding home Losartan-Hydrochlorothiazide combo in the setting of  sepsis -Continue monitor blood pressures per protocol -Last blood pressure reading was elevated at 135/67   Hypokalemia -Mild and in the setting of nausea vomiting -Potassium has gone from 3.2 -> 3.6 -> 3.3 -> 3.0  -> 3.9  -Continue to monitor and replete as necessary -Repeat CMP in a.m.   Rash, improving slowly  -Noted to be likely a drug reaction given that she has multiple allergies be on her chest and will start Benadryl for her itching as well as hydrocortisone cream -Unclear if this is related to her meningitis -Continue to monitor and if necessary may need systemic steroids -Antibiotics have been changed from ceftriaxone to IV meropenem and per ID recommending oral Levaquin equivalent at discharge for 14 days   Hypophosphatemia -  Patient's Phos level was 2.1 -> 2.6 -> 2.1 -Replete with p.o. K-Phos Neutral 500 mg p.o. twice daily x2 doses again -Continue to monitor and replete as necessary -Repeat Phos in a.m.   Obesity -Complicates overall prognosis and care -Estimated body mass index is 33.16 kg/m as calculated from the following:   Height as of this encounter:  (1.651 m).   Weight as of this encounter: 90.4 kg.  -Weight Loss and Dietary Counseling given   DVT prophylaxis: enoxaparin (LOVENOX) injection 40 mg Start: 04/08/22 1000    Code Status: Full Code Family Communication: No family present at bedside   Disposition Plan:  Level of care: Progressive Status is: Inpatient Remains inpatient appropriate because: Continues to have a headache and needs further evaluation and clearance by ID and improvement.  Likely can be discharged in the next 24  to 48 hours if improved and PT OT recommending no follow-up   Consultants:  Infectious diseases Neurology for LP  Procedures:  LP  Antimicrobials:  Anti-infectives (From admission, onward)    Start     Dose/Rate Route Frequency Ordered Stop   04/10/22 2200  cefTRIAXone (ROCEPHIN) 2 g in sodium chloride 0.9 % 100 mL IVPB  Status:  Discontinued        2 g 200 mL/hr over 30 Minutes Intravenous Every 12 hours 04/10/22 1011 04/10/22 1606   04/10/22 1700  meropenem (MERREM) 2 g in sodium chloride 0.9 % 100 mL IVPB        2 g 280 mL/hr over 30 Minutes Intravenous Every 8 hours 04/10/22 1606     04/08/22 2200  vancomycin (VANCOCIN) IVPB 1000 mg/200 mL premix  Status:  Discontinued        1,000 mg 200 mL/hr over 60 Minutes Intravenous Every 24 hours 04/08/22 0024 04/08/22 2011   04/08/22 2200  cefTRIAXone (ROCEPHIN) 2 g in sodium chloride 0.9 % 100 mL IVPB  Status:  Discontinued        2 g 200 mL/hr over 30 Minutes Intravenous Every 24 hours 04/08/22 2011 04/10/22 1011   04/08/22 0600  ceFEPIme (MAXIPIME) 2 g in sodium chloride 0.9 % 100 mL IVPB  Status:  Discontinued        2 g 200 mL/hr over 30 Minutes Intravenous Every 8 hours 04/08/22 0024 04/08/22 2011   04/08/22 0015  metroNIDAZOLE (FLAGYL) IVPB 500 mg  Status:  Discontinued        500 mg 100 mL/hr over 60 Minutes Intravenous Every 12 hours 04/08/22 0008 04/08/22 2011   04/07/22 2230  piperacillin-tazobactam (ZOSYN) IVPB 3.375 g        3.375 g 100 mL/hr over 30 Minutes Intravenous  Once 04/07/22 2224 04/07/22 2318   04/07/22 2230  vancomycin (VANCOCIN) IVPB 1000 mg/200 mL premix        1,000 mg 200 mL/hr over 60 Minutes Intravenous  Once 04/07/22 2224 04/08/22 0049       Subjective: Seen and examined at bedside and states that she was having a headache earlier but thinks the ketorolac making a little bit better.  Felt okay but her main complaint was this headache.  Had no nausea or vomiting now.  Denies other concerns or  complaints at this time  Objective: Vitals:   04/11/22 1200 04/11/22 2132 04/12/22 0459 04/12/22 1414  BP: 135/67 (!) 146/61 (!) 158/69 131/61  Pulse: (!) 57 (!) 59 (!) 59 64  Resp: Temp: 98.3 F (36.8 C) 99.2 F (37.3 C)  99.3 F (37.4 C) 98.4 F (36.9 C)  TempSrc: Oral Oral Oral Oral  SpO2: 100% 95% 95%   Weight:      Height:        Intake/Output Summary (Last 24 hours) at 04/12/2022 1651 Last data filed at 04/12/2022 0300 Gross per 24 hour  Intake 996.25 ml  Output --  Net 996.25 ml   Filed Weights   04/07/22 2055 04/09/22 0400  Weight: 82 kg 90.4 kg   Examination: Physical Exam:  Constitutional: WN/WD obese Caucasian female currently no acute distress Respiratory: Diminished to auscultation bilaterally with coarse breath sounds, no wheezing, rales, rhonchi or crackles.  Unlabored breathing Cardiovascular: RRR, no murmurs / rubs / gallops. S1 and S2 auscultated. No extremity edema. 2+ pedal pulses. No carotid bruits.  Abdomen: Soft, non-tender, distended secondary body habitus. Bowel sounds positive.  GU: Deferred. Musculoskeletal: No clubbing / cyanosis of digits/nails. No joint deformity upper and lower extremities.  Skin: Has a splotchy rash on her chest that is itchy and erythematous Neurologic: CN 2-12 grossly intact with no focal deficits.  Romberg sign and cerebellar reflexes not assessed.  Psychiatric: Normal judgment and insight. Alert and oriented x 3. Normal mood and appropriate affect.   Data Reviewed: I have personally reviewed following labs and imaging studies  CBC: Recent Labs  Lab 04/07/22 2105 04/08/22 0557 04/09/22 0914 04/10/22 0916 04/11/22 0415 04/12/22 0849  WBC 18.2* 18.8* 15.7* 12.0* 9.4 7.9  NEUTROABS 15.8*  --  13.0* 9.1* 6.4 5.7  HGB 14.3 12.6 12.2 12.1 11.2* 11.9*  HCT 42.1 37.7 36.4 35.3* 33.9* 35.1*  MCV 93.6 93.8 95.3 93.6 94.2 93.4  PLT 283 266 251 250 241 246   Basic Metabolic Panel: Recent Labs  Lab  04/08/22 0557 04/09/22 0914 04/10/22 0916 04/11/22 0415 04/12/22 0849  NA 138 138 139 141 138  K 3.2* 3.6 3.3* 3.0* 3.9  CL 104 105 103 107 106  CO2 28 26 28 27 27   GLUCOSE 152* 121* 122* 134* 107*  BUN 22 16 15 11 8   CREATININE 0.66 0.51 0.51 0.70 0.55  CALCIUM 8.8* 9.4 8.9 8.7* 8.7*  MG  --  2.2 2.3 1.9 2.2  PHOS  --  2.1* 2.1* 2.6 2.1*   GFR: Estimated Creatinine Clearance: 68.6 mL/min (by C-G formula based on SCr of 0.55 mg/dL). Liver Function Tests: Recent Labs  Lab 04/08/22 0557 04/09/22 0914 04/10/22 0916 04/11/22 0415 04/12/22 0849  AST 22 17 16 20 29   ALT 16 16 14 15 21   ALKPHOS 86 91 84 74 90  BILITOT 1.3* 1.0 0.8 0.5 0.7  PROT 6.5 6.7 6.2* 5.4* 5.9*  ALBUMIN 3.1* 3.0* 2.7* 2.4* 2.6*   No results for input(s): LIPASE, AMYLASE in the last 168 hours. No results for input(s): AMMONIA in the last 168 hours. Coagulation Profile: Recent Labs  Lab 04/07/22 2105 04/08/22 0557  INR 1.0 1.2   Cardiac Enzymes: No results for input(s): CKTOTAL, CKMB, CKMBINDEX, TROPONINI in the last 168 hours. BNP (last 3 results) No results for input(s): PROBNP in the last 8760 hours. HbA1C: No results for input(s): HGBA1C in the last 72 hours. CBG: Recent Labs  Lab 04/09/22 1851  GLUCAP 88   Lipid Profile: No results for input(s): CHOL, HDL, LDLCALC, TRIG, CHOLHDL, LDLDIRECT in the last 72 hours. Thyroid Function Tests: No results for input(s): TSH, T4TOTAL, FREET4, T3FREE, THYROIDAB in the last 72 hours. Anemia Panel: No results for input(s): VITAMINB12, FOLATE, FERRITIN, TIBC, IRON, RETICCTPCT in the last  72 hours. Sepsis Labs: Recent Labs  Lab 04/07/22 2105 04/08/22 0557 04/10/22 0920 04/11/22 0415 04/12/22 0432  PROCALCITON  --  1.43 0.51 0.23 0.10  LATICACIDVEN 1.9  --   --   --   --     Recent Results (from the past 240 hour(s))  SARS Coronavirus 2 by RT PCR (hospital order, performed in [facility] hospital lab) *cepheid single result test*     Status:  None   Collection Time: 04/07/22  9:02 PM   Specimen: Nasal Swab  Result Value Ref Range Status   SARS Coronavirus 2 by RT PCR NEGATIVE NEGATIVE Final    Comment: (NOTE) SARS-CoV-2 target nucleic acids are NOT DETECTED.  The SARS-CoV-2 RNA is generally detectable in upper and lower respiratory specimens during the acute phase of infection. The lowest concentration of SARS-CoV-2 viral copies this assay can detect is 250 copies / mL. A negative result does not preclude SARS-CoV-2 infection and should not be used as the sole basis for treatment or other patient management decisions.  A negative result may occur with improper specimen collection / handling, submission of specimen other than nasopharyngeal swab, presence of viral mutation(s) within the areas targeted by this assay, and inadequate number of viral copies (<250 copies / mL). A negative result must be combined with clinical observations, patient history, and epidemiological information.  Fact Sheet for Patients:   RoadLapTop.co.za  Fact Sheet for Healthcare Providers: http://kim-miller.com/  This test is not yet approved or  cleared by the [place] FDA and has been authorized for detection and/or diagnosis of SARS-CoV-2 by FDA under an Emergency Use Authorization (EUA).  This EUA will remain in effect (meaning this test can be used) for the duration of the COVID-19 declaration under Section 564(b)(1) of the Act, 21 U.S.C. section 360bbb-3(b)(1), unless the authorization is terminated or revoked sooner.  Performed at [facility], [address] [place], [place] [address]   Culture, blood (Routine x 2)     Status: Abnormal (Preliminary result)   Collection Time: 04/07/22  9:05 PM   Specimen: BLOOD  Result Value Ref Range Status   Specimen Description   Final    BLOOD LEFT ANTECUBITAL Performed at [facility], [address], [place], [place] [address]    Special Requests   Final    BOTTLES DRAWN AEROBIC AND ANAEROBIC Blood Culture adequate volume Performed at [facility], [address], [place], [place] [address]    Culture  Setup Time   Final    GRAM NEGATIVE COCCOBACILLI IN BOTH AEROBIC AND ANAEROBIC BOTTLES CRITICAL RESULT CALLED TO, READ BACK BY AND VERIFIED WITH: MARY ASHLYN TUCKER ON 04/08/22 @ 1952 BY DRT    Culture (A)  Final    HAEMOPHILUS INFLUENZAE BETA LACTAMASE NEGATIVE HEALTH DEPARTMENT NOTIFIED Referred to [facility] in [place], [place] for serotyping. Performed at [facility], [address], [place], [place] [address]    Report Status PENDING  Incomplete  Blood Culture ID Panel (Reflexed)     Status: Abnormal   Collection Time: 04/07/22  9:05 PM  Result Value Ref Range Status   Enterococcus faecalis NOT DETECTED NOT DETECTED Final   Enterococcus Faecium NOT DETECTED NOT DETECTED Final   Listeria monocytogenes NOT DETECTED NOT DETECTED Final   Staphylococcus species NOT DETECTED NOT DETECTED Final   Staphylococcus aureus (BCID) NOT DETECTED NOT DETECTED Final   Staphylococcus epidermidis NOT DETECTED NOT DETECTED Final   Staphylococcus  lugdunensis NOT DETECTED NOT DETECTED Final   Streptococcus species NOT DETECTED NOT DETECTED Final   Streptococcus agalactiae NOT DETECTED NOT DETECTED Final   Streptococcus pneumoniae NOT DETECTED NOT DETECTED Final   Streptococcus pyogenes NOT DETECTED NOT DETECTED Final   A.calcoaceticus-baumannii NOT DETECTED NOT DETECTED Final   Bacteroides fragilis NOT DETECTED NOT DETECTED Final   Enterobacterales NOT DETECTED NOT DETECTED Final   Enterobacter cloacae complex NOT DETECTED NOT DETECTED Final   Escherichia coli NOT DETECTED NOT DETECTED Final   Klebsiella aerogenes NOT DETECTED NOT DETECTED Final   Klebsiella oxytoca NOT DETECTED NOT DETECTED Final   Klebsiella pneumoniae NOT DETECTED NOT DETECTED Final   Proteus  species NOT DETECTED NOT DETECTED Final   Salmonella species NOT DETECTED NOT DETECTED Final   Serratia marcescens NOT DETECTED NOT DETECTED Final   Haemophilus influenzae DETECTED (A) NOT DETECTED Final    Comment: CRITICAL RESULT CALLED TO, READ BACK BY AND VERIFIED WITH: MARY ASHLYN TUCKER ON 04/08/22 @ 1952 BY DRT    Neisseria meningitidis NOT DETECTED NOT DETECTED Final   Pseudomonas aeruginosa NOT DETECTED NOT DETECTED Final   Stenotrophomonas maltophilia NOT DETECTED NOT DETECTED Final   Candida albicans NOT DETECTED NOT DETECTED Final   Candida auris NOT DETECTED NOT DETECTED Final   Candida glabrata NOT DETECTED NOT DETECTED Final   Candida krusei NOT DETECTED NOT DETECTED Final   Candida parapsilosis NOT DETECTED NOT DETECTED Final   Candida tropicalis NOT DETECTED NOT DETECTED Final   Cryptococcus neoformans/gattii NOT DETECTED NOT DETECTED Final    Comment: Performed at [facility], [address] [place], [place] [address]  Culture, blood (Routine x 2)     Status: Abnormal   Collection Time: 04/07/22  9:16 PM   Specimen: BLOOD  Result Value Ref Range Status   Specimen Description   Final    BLOOD BLOOD LEFT HAND Performed at [facility], [address], [place], [place] [address]    Special Requests   Final    BOTTLES DRAWN AEROBIC ONLY Blood Culture results may not be optimal due to an inadequate volume of blood received in culture bottles Performed at [facility], [address] [place], [place] [address]    Culture  Setup Time   Final    GRAM NEGATIVE COCCOBACILLI AEROBIC BOTTLE ONLY CRITICAL RESULT CALLED TO, READ BACK BY AND VERIFIED WITH: PHARMD MARY ASHLYN TUCKER ON 04/08/22 @ 1958 BY DRT    Culture (A)  Final    HAEMOPHILUS INFLUENZAE BETA LACTAMASE NEGATIVE HEALTH DEPARTMENT NOTIFIED Performed at [facility], [address], [place], [place] [address]    Report Status 04/11/2022 FINAL  Final  Urine  Culture     Status: Abnormal   Collection Time: 04/08/22  3:26 AM   Specimen: Urine, Clean Catch  Result Value Ref Range Status   Specimen Description   Final    URINE, CLEAN CATCH Performed at [facility], [address], [place], [place] [address]    Special Requests   Final    NONE Performed at [facility], [address] [place], [place] [address]    Culture >=100,000 COLONIES/mL KLEBSIELLA PNEUMONIAE (A)  Final   Report Status 04/11/2022 FINAL  Final   Organism ID, Bacteria KLEBSIELLA PNEUMONIAE (A)  Final      Susceptibility   Klebsiella pneumoniae - MIC*    AMPICILLIN >=32 RESISTANT Resistant     CEFAZOLIN <=4 SENSITIVE Sensitive  CEFEPIME <=0.12 SENSITIVE Sensitive     CEFTRIAXONE <=0.25 SENSITIVE Sensitive     CIPROFLOXACIN <=0.25 SENSITIVE Sensitive     GENTAMICIN <=1 SENSITIVE Sensitive     IMIPENEM <=0.25 SENSITIVE Sensitive     NITROFURANTOIN <=16 SENSITIVE Sensitive     TRIMETH/SULFA <=20 SENSITIVE Sensitive     AMPICILLIN/SULBACTAM 16 INTERMEDIATE Intermediate     PIP/TAZO <=4 SENSITIVE Sensitive     * >=100,000 COLONIES/mL KLEBSIELLA PNEUMONIAE  CSF culture w Gram Stain     Status: None (Preliminary result)   Collection Time: 04/09/22  4:21 PM   Specimen: CSF; Cerebrospinal Fluid  Result Value Ref Range Status   Specimen Description   Final    CSF Performed at [facility], [address], [place], [place] [address]    Special Requests   Final    NONE Performed at [facility], [address], [place], [place] [address]    Gram Stain   Final    WBC PRESENT,BOTH PMN AND MONONUCLEAR NO ORGANISMS SEEN CYTOSPIN SMEAR Gram Stain Report Called to,Read Back By and Verified With: Loetta Rough, A Rn on 04/09/22 @ 1722 by Edson Snowball STAIN REVIEWED-AGREE WITH RESULT    Culture   Final    NO GROWTH 3 DAYS Performed at [facility], [address] [place], [place] [address]     Report Status PENDING  Incomplete    Radiology Studies: No results found.  Scheduled Meds:  acetaminophen  325 mg Oral Once   amLODipine  5 mg Oral Daily   enoxaparin (LOVENOX) injection  40 mg Subcutaneous Q24H   hydrocortisone cream   Topical TID   sodium chloride flush  3 mL Intravenous Q12H   Continuous Infusions:  meropenem (MERREM) IV 2 g (04/12/22 0907)   promethazine (PHENERGAN) injection (IM or IVPB)       LOS: 3 days   Marguerita Merles, DO [facility] Available via Epic secure chat 7am-7pm After these hours, please refer to coverage provider listed on [email or web address] 04/12/2022, 4:51 PM

## 2022-04-12 NOTE — Progress Notes (Signed)
[facility]   Reason for visit: follow up on meningitis  Interval History: she had a headache today but improved with medication.  No fever.  Poor appetite.    Less itch from the rash.   Physical Exam: Constitutional:  Vitals:   04/12/22 0459 04/12/22 1414  BP: (!) 158/69 131/61  Pulse: (!) 59 64  Resp: 19   Temp: 99.3 F (37.4 C) 98.4 F (36.9 C)  SpO2: 95%   She is in nad Respiratory: normal respiratory effort Skin: rash on chest diminishing  Review of Systems: Constitutional: negative for fever and chills  Lab Results  Component Value Date   WBC 7.9 04/12/2022   HGB 11.9 (L) 04/12/2022   HCT 35.1 (L) 04/12/2022   MCV 93.4 04/12/2022   PLT 246 04/12/2022    Lab Results  Component Value Date   CREATININE 0.55 04/12/2022   BUN 8 04/12/2022   NA 138 04/12/2022   K 3.9 04/12/2022   CL 106 04/12/2022   CO2 27 04/12/2022    Lab Results  Component Value Date   ALT 21 04/12/2022   AST 29 04/12/2022   ALKPHOS 90 04/12/2022     Microbiology: Recent Results (from the past 240 hour(s))  SARS Coronavirus 2 by RT PCR (hospital order, performed in [facility] hospital lab) *cepheid single result test*     Status: None   Collection Time: 04/07/22  9:02 PM   Specimen: Nasal Swab  Result Value Ref Range Status   SARS Coronavirus 2 by RT PCR NEGATIVE NEGATIVE Final    Comment: (NOTE) SARS-CoV-2 target nucleic acids are NOT DETECTED.  The SARS-CoV-2 RNA is generally detectable in upper and lower respiratory specimens during the acute phase of infection. The lowest concentration of SARS-CoV-2 viral copies this assay can detect is 250 copies / mL. A negative result does not preclude SARS-CoV-2 infection and should not be used as the sole basis for treatment or other patient management decisions.  A negative result may occur with improper specimen collection / handling, submission of specimen other than nasopharyngeal swab, presence of viral  mutation(s) within the areas targeted by this assay, and inadequate number of viral copies (<250 copies / mL). A negative result must be combined with clinical observations, patient history, and epidemiological information.  Fact Sheet for Patients:   https://www.patel.info/  Fact Sheet for Healthcare Providers: https://hall.com/  This test is not yet approved or  cleared by the [place] FDA and has been authorized for detection and/or diagnosis of SARS-CoV-2 by FDA under an Emergency Use Authorization (EUA).  This EUA will remain in effect (meaning this test can be used) for the duration of the COVID-19 declaration under Section 564(b)(1) of the Act, 21 U.S.C. section 360bbb-3(b)(1), unless the authorization is terminated or revoked sooner.  Performed at [facility], [place] [address], [place], [place] [address]   Culture, blood (Routine x 2)     Status: Abnormal (Preliminary result)   Collection Time: 04/07/22  9:05 PM   Specimen: BLOOD  Result Value Ref Range Status   Specimen Description   Final    BLOOD LEFT ANTECUBITAL Performed at [facility] [address], [place], [place] [address]    Special Requests   Final    BOTTLES DRAWN AEROBIC AND ANAEROBIC Blood Culture adequate volume Performed at [facility] [address], [place], [place] [address]    Culture  Setup Time   Final  GRAM NEGATIVE COCCOBACILLI IN BOTH AEROBIC AND ANAEROBIC BOTTLES CRITICAL RESULT CALLED TO, READ BACK BY AND VERIFIED WITH: [place] ON 04/08/22 @ 1952 BY DRT    Culture (A)  Final    HAEMOPHILUS INFLUENZAE BETA LACTAMASE NEGATIVE HEALTH DEPARTMENT NOTIFIED Referred to [facility] in [place], [place] for serotyping. Performed at [facility], [place] [address] [place], [place] [address]    Report Status PENDING  Incomplete  Blood Culture ID Panel (Reflexed)     Status:  Abnormal   Collection Time: 04/07/22  9:05 PM  Result Value Ref Range Status   Enterococcus faecalis NOT DETECTED NOT DETECTED Final   Enterococcus Faecium NOT DETECTED NOT DETECTED Final   Listeria monocytogenes NOT DETECTED NOT DETECTED Final   Staphylococcus species NOT DETECTED NOT DETECTED Final   Staphylococcus aureus (BCID) NOT DETECTED NOT DETECTED Final   Staphylococcus epidermidis NOT DETECTED NOT DETECTED Final   Staphylococcus lugdunensis NOT DETECTED NOT DETECTED Final   Streptococcus species NOT DETECTED NOT DETECTED Final   Streptococcus agalactiae NOT DETECTED NOT DETECTED Final   Streptococcus pneumoniae NOT DETECTED NOT DETECTED Final   Streptococcus pyogenes NOT DETECTED NOT DETECTED Final   A.calcoaceticus-baumannii NOT DETECTED NOT DETECTED Final   Bacteroides fragilis NOT DETECTED NOT DETECTED Final   Enterobacterales NOT DETECTED NOT DETECTED Final   Enterobacter cloacae complex NOT DETECTED NOT DETECTED Final   Escherichia coli NOT DETECTED NOT DETECTED Final   Klebsiella aerogenes NOT DETECTED NOT DETECTED Final   Klebsiella oxytoca NOT DETECTED NOT DETECTED Final   Klebsiella pneumoniae NOT DETECTED NOT DETECTED Final   Proteus species NOT DETECTED NOT DETECTED Final   Salmonella species NOT DETECTED NOT DETECTED Final   Serratia marcescens NOT DETECTED NOT DETECTED Final   Haemophilus influenzae DETECTED (A) NOT DETECTED Final    Comment: CRITICAL RESULT CALLED TO, READ BACK BY AND VERIFIED WITH: MARY ASHLYN TUCKER ON 04/08/22 @ 1952 BY DRT    Neisseria meningitidis NOT DETECTED NOT DETECTED Final   Pseudomonas aeruginosa NOT DETECTED NOT DETECTED Final   Stenotrophomonas maltophilia NOT DETECTED NOT DETECTED Final   Candida albicans NOT DETECTED NOT DETECTED Final   Candida auris NOT DETECTED NOT DETECTED Final   Candida glabrata NOT DETECTED NOT DETECTED Final   Candida krusei NOT DETECTED NOT DETECTED Final   Candida parapsilosis NOT DETECTED NOT  DETECTED Final   Candida tropicalis NOT DETECTED NOT DETECTED Final   Cryptococcus neoformans/gattii NOT DETECTED NOT DETECTED Final    Comment: Performed at [facility], [address], [place], [place] [address]  Culture, blood (Routine x 2)     Status: Abnormal   Collection Time: 04/07/22  9:16 PM   Specimen: BLOOD  Result Value Ref Range Status   Specimen Description   Final    BLOOD BLOOD LEFT HAND Performed at [facility] [address], [place], [place] [address]    Special Requests   Final    BOTTLES DRAWN AEROBIC ONLY Blood Culture results may not be optimal due to an inadequate volume of blood received in culture bottles Performed at [facility] [address], [place], [place] [address]    Culture  Setup Time   Final    GRAM NEGATIVE COCCOBACILLI AEROBIC BOTTLE ONLY CRITICAL RESULT CALLED TO, READ BACK BY AND VERIFIED WITH: PHARMD MARY ASHLYN TUCKER ON 04/08/22 @ 1958 BY DRT    Culture (A)  Final    HAEMOPHILUS INFLUENZAE BETA LACTAMASE NEGATIVE  HEALTH DEPARTMENT NOTIFIED Performed at [facility], [place] [address], [place], [place] [address]    Report Status 04/11/2022 FINAL  Final  Urine Culture     Status: Abnormal   Collection Time: 04/08/22  3:26 AM   Specimen: Urine, Clean Catch  Result Value Ref Range Status   Specimen Description   Final    URINE, CLEAN CATCH Performed at [facility], [place] [address] [place], [place] [address]    Special Requests   Final    NONE Performed at [facility], [place] [address], [place], [place] [address]    Culture >=100,000 COLONIES/mL KLEBSIELLA PNEUMONIAE (A)  Final   Report Status 04/11/2022 FINAL  Final   Organism ID, Bacteria KLEBSIELLA PNEUMONIAE (A)  Final      Susceptibility   Klebsiella pneumoniae - MIC*    AMPICILLIN >=32 RESISTANT Resistant     CEFAZOLIN <=4 SENSITIVE Sensitive     CEFEPIME <=0.12 SENSITIVE Sensitive      CEFTRIAXONE <=0.25 SENSITIVE Sensitive     CIPROFLOXACIN <=0.25 SENSITIVE Sensitive     GENTAMICIN <=1 SENSITIVE Sensitive     IMIPENEM <=0.25 SENSITIVE Sensitive     NITROFURANTOIN <=16 SENSITIVE Sensitive     TRIMETH/SULFA <=20 SENSITIVE Sensitive     AMPICILLIN/SULBACTAM 16 INTERMEDIATE Intermediate     PIP/TAZO <=4 SENSITIVE Sensitive     * >=100,000 COLONIES/mL KLEBSIELLA PNEUMONIAE  CSF culture w Gram Stain     Status: None (Preliminary result)   Collection Time: 04/09/22  4:21 PM   Specimen: CSF; Cerebrospinal Fluid  Result Value Ref Range Status   Specimen Description   Final    CSF Performed at [facility] [address], [place], [place] [address]    Special Requests   Final    NONE Performed at [facility], [place] [address] [place], [place] [address]    Gram Stain   Final    WBC PRESENT,BOTH PMN AND MONONUCLEAR NO ORGANISMS SEEN CYTOSPIN SMEAR Gram Stain Report Called to,Read Back By and Verified With: Harrie Foreman, A Rn on 04/09/22 @ 1722 by Lonia Mad STAIN REVIEWED-AGREE WITH RESULT    Culture   Final    NO GROWTH 3 DAYS Performed at [facility], [place] [address] [place], [place] [address]    Report Status PENDING  Incomplete    Impression/Plan:  1. H influenza bacteremia with meningitis - 3/3 blood culture bottles positive and WBC in the CSF c/w meningitis.  On meropenem due to allergy and tolerating well.  No new issues.  At discharge, she can continnue with oral levaquin 500 mg daily to complete 14 days of antibiotics.    2.  IP - she is on droplet precautions but no indication for H influenza for isolation.  I have d/ced isolation.   3.  Headaches - from #1.  Continue with supportive care.    [facility] for discharge from ID standpoint.  I will sign off, call with questions.

## 2022-04-13 LAB — CBC WITH DIFFERENTIAL/PLATELET
Abs Immature Granulocytes: 0.1 10*3/uL — ABNORMAL HIGH (ref 0.00–0.07)
Basophils Absolute: 0 10*3/uL (ref 0.0–0.1)
Basophils Relative: 0 %
Eosinophils Absolute: 0.5 10*3/uL (ref 0.0–0.5)
Eosinophils Relative: 8 %
HCT: 34 % — ABNORMAL LOW (ref 36.0–46.0)
Hemoglobin: 11.4 g/dL — ABNORMAL LOW (ref 12.0–15.0)
Immature Granulocytes: 2 %
Lymphocytes Relative: 18 %
Lymphs Abs: 1.2 10*3/uL (ref 0.7–4.0)
MCH: 31.3 pg (ref 26.0–34.0)
MCHC: 33.5 g/dL (ref 30.0–36.0)
MCV: 93.4 fL (ref 80.0–100.0)
Monocytes Absolute: 0.7 10*3/uL (ref 0.1–1.0)
Monocytes Relative: 10 %
Neutro Abs: 4.2 10*3/uL (ref 1.7–7.7)
Neutrophils Relative %: 62 %
Platelets: 256 10*3/uL (ref 150–400)
RBC: 3.64 MIL/uL — ABNORMAL LOW (ref 3.87–5.11)
RDW: 12.4 % (ref 11.5–15.5)
WBC: 6.7 10*3/uL (ref 4.0–10.5)
nRBC: 0 % (ref 0.0–0.2)

## 2022-04-13 LAB — MAGNESIUM: Magnesium: 2.2 mg/dL (ref 1.7–2.4)

## 2022-04-13 LAB — CSF CULTURE W GRAM STAIN: Culture: NO GROWTH

## 2022-04-13 LAB — COMPREHENSIVE METABOLIC PANEL
ALT: 21 U/L (ref 0–44)
AST: 21 U/L (ref 15–41)
Albumin: 2.5 g/dL — ABNORMAL LOW (ref 3.5–5.0)
Alkaline Phosphatase: 81 U/L (ref 38–126)
Anion gap: 5 (ref 5–15)
BUN: 7 mg/dL — ABNORMAL LOW (ref 8–23)
CO2: 29 mmol/L (ref 22–32)
Calcium: 8.9 mg/dL (ref 8.9–10.3)
Chloride: 105 mmol/L (ref 98–111)
Creatinine, Ser: 0.59 mg/dL (ref 0.44–1.00)
GFR, Estimated: >60 mL/min (ref >60)
Glucose, Bld: 100 mg/dL — ABNORMAL HIGH (ref 70–99)
Potassium: 3.6 mmol/L (ref 3.5–5.1)
Sodium: 139 mmol/L (ref 135–145)
Total Bilirubin: 0.6 mg/dL (ref 0.3–1.2)
Total Protein: 5.7 g/dL — ABNORMAL LOW (ref 6.5–8.1)

## 2022-04-13 LAB — PHOSPHORUS: Phosphorus: 2.5 mg/dL (ref 2.5–4.6)

## 2022-04-13 MED ORDER — TRAMADOL HCL 50 MG PO TABS
50.0000 mg | ORAL_TABLET | Freq: Four times a day (QID) | ORAL | Status: DC
  Administered 2022-04-13: 50 mg via ORAL
  Filled 2022-04-13 (×3): qty 1

## 2022-04-13 MED ORDER — DIPHENHYDRAMINE HCL 50 MG/ML IJ SOLN: 12.5000 mg | Freq: Once | INTRAMUSCULAR | Status: DC

## 2022-04-13 MED ORDER — AMLODIPINE BESYLATE 10 MG PO TABS
10.0000 mg | ORAL_TABLET | Freq: Every day | ORAL | Status: DC
  Administered 2022-04-14: 10 mg via ORAL
  Filled 2022-04-13: qty 1

## 2022-04-13 MED ORDER — SODIUM CHLORIDE 0.9 % IV BOLUS
1000.0000 mL | Freq: Once | INTRAVENOUS | Status: AC
  Administered 2022-04-13: 1000 mL via INTRAVENOUS

## 2022-04-13 MED ORDER — MELATONIN 3 MG PO TABS
3.0000 mg | ORAL_TABLET | Freq: Once | ORAL | Status: AC
Start: 2022-04-14 — End: 2022-04-14
  Administered 2022-04-14: 3 mg via ORAL
  Filled 2022-04-13: qty 1

## 2022-04-13 MED ORDER — KETOROLAC TROMETHAMINE 30 MG/ML IJ SOLN
30.0000 mg | Freq: Once | INTRAMUSCULAR | Status: AC
  Administered 2022-04-13: 30 mg via INTRAVENOUS
  Filled 2022-04-13: qty 1

## 2022-04-13 MED ORDER — LEVOFLOXACIN 500 MG PO TABS
500.0000 mg | ORAL_TABLET | Freq: Every day | ORAL | Status: DC
  Administered 2022-04-13 – 2022-04-14 (×2): 500 mg via ORAL
  Filled 2022-04-13 (×2): qty 1

## 2022-04-13 NOTE — Progress Notes (Signed)
PROGRESS NOTE    Jenny Krueger  [id number] DOB: Mar 14, 1946 DOA: 04/07/2022 PCP: Leighton Ruff, MD (Inactive)     Brief Narrative:  Jenny Krueger is a 76 y.o. WF PMHx HTN, HLD, Hypothyroidism, degenerative disc disease, Fibromyalgia   Presenting with altered mental status.  History obtained with assistance of chart review and family.  Patient had been seemingly at baseline but was noted to be altered today when visiting with family.  Has had recent sinus infections.  Earlier today was noted to have increased somnolence after taking Benadryl.  Upon waking did have episode of nausea and vomiting. Also reports some intermittent fevers, and headaches.   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  -ID consulted: Recommend LP -LP done and showed RBC of 1260 on Tube 1 and  21 on Tube 4, WBC elevated of 2 1 being 195 and 2 for being 107 with segmented neutrophils being 63 and 50, glucose of 21 protein of 110 and CSF showing no WBC cultures and VDRL CSF, at bedtime V1 and 2 PCR pending -Continue to monitor and trend and follow clinical course -6/7 continued neck tenderness but no true nuchal rigidity -ID recommends continuing ceftriaxone 2 g every 12 but have now changed to IV Meropenem given Rash;  -6/7 per EMR note ID consulted recommending Oral Levofloxacin 500 mg po Daily to complete 14 Days of Abx    Acute  Headache associated with Neck Pain, improving  -Does not really have any nuchal rigidity but continues to complain of posterior pain in her neck as well as head and now that she has H Influenzae Bacteremia it is concerning for Meningitis  -States that she gets headaches when she gets sinus infections and is worse on the right side -Head CT w/o Contrast showed "Atrophy and small-vessel disease with no acute intracranial CT findings. Sinus and right mastoid disease as described above" -MRI Brain w/o Contrast done and showed "No evidence of acute intracranial abnormality" and MRI of the Cervical Spine showed Multilevel degenerative changes of the cervical spine as described above. 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 bili is now 1.3 -> 1.0 -> 0.8 -> 0.5 and is now 0.7 -Continue to monitor and trend and continue with IV fluid hydration -Repeat CMP in a.m.   Hypoalbuminemia -Pateint's Albumin Level went from 3.5 -> 3.1 -> 3.0 -> 2.7 -> 2.4 -Continue to Monitor and trend  Essential HTN -6/7 increase home Amlodipine 10 mg daily -Hold home Losartan-Hydrochlorothiazide    Hypokalemia -Mild and in the setting of nausea vomiting -Potassium has gone from 3.2 -> 3.6 -> 3.3 -> 3.0  -> 3.9  -Continue to monitor and replete as necessary -Repeat CMP in a.m.   Rash, improving slowly  -Noted to be likely a drug reaction given that she has multiple allergies be  on her chest and will start Benadryl for her itching as well as hydrocortisone cream -Antibiotics have been changed from ceftriaxone to IV meropenem and per ID recommending oral Levaquin equivalent at discharge for 14 days   Hypophosphatemia -Phosphorus goal> 2.5   Obesity -Complicates overall prognosis and care -Estimated body  mass index is 33.16 kg/m as calculated from the following:   Height as of this encounter: 5\' 5"  (1.651 m).   Weight as of this encounter: 90.4 kg.  -Weight Loss and Dietary Counseling given         Mobility Assessment (last 72 hours)     Mobility Assessment     Row Name 04/12/22 2205 04/12/22 2020 04/12/22 1030 04/11/22 2123 04/11/22 1000   Does patient have an order for bedrest or is patient medically unstable No - Continue assessment No - Continue assessment No - Continue assessment No - Continue assessment No - Continue assessment   What is the highest level of mobility based on the progressive mobility assessment? Level 5 (Walks with assist in room/hall) - Balance while stepping forward/back and can walk in room with assist - Complete Level 5 (Walks with assist in room/hall) - Balance while stepping forward/back and can walk in room with assist - Complete Level 5 (Walks with assist in room/hall) - Balance while stepping forward/back and can walk in room with assist - Complete Level 5 (Walks with assist in room/hall) - Balance while stepping forward/back and can walk in room with assist - Complete Level 4 (Walks with assist in room) - Balance while marching in place and cannot step forward and back - Complete   Is the above level different from baseline mobility prior to current illness? -- -- Yes - Recommend PT order Yes - Recommend PT order Yes - Recommend PT order    Row Name 04/10/22 2010 04/10/22 1200         Does patient have an order for bedrest or is patient medically unstable No - Continue assessment No - Continue assessment      What is the highest level of mobility based on the progressive mobility assessment? Level 4 (Walks with assist in room) - Balance while marching in place and cannot step forward and back - Complete --      Is the above level different from baseline mobility prior to current illness? Yes - Recommend PT order --                  Alainah Phang, Geraldo Docker, MD  04/13/2022, 9:33 AM         DVT prophylaxis: Lovenox Code Status: Full Family Communication:  Status is: Inpatient    Dispo: The patient is from: Home              Anticipated d/c is to: Home              Anticipated d/c date is: 1 day              Patient currently is not medically stable to d/c.      Consultants:  ID   Procedures/Significant Events:    I have personally reviewed and interpreted all radiology studies and my findings are as above.  VENTILATOR SETTINGS:    Cultures   Antimicrobials: Anti-infectives (From admission, onward)    Start     Ordered Stop   04/10/22 2200  cefTRIAXone (ROCEPHIN) 2 g in sodium chloride 0.9 % 100 mL IVPB  Status:  Discontinued  04/10/22 1011 04/10/22 1606   04/10/22 1700  meropenem (MERREM) 2 g in sodium chloride 0.9 % 100 mL IVPB        04/10/22 1606     04/08/22 2200  vancomycin (VANCOCIN) IVPB 1000 mg/200 mL premix  Status:  Discontinued        04/08/22 0024 04/08/22 2011   04/08/22 2200  cefTRIAXone (ROCEPHIN) 2 g in sodium chloride 0.9 % 100 mL IVPB  Status:  Discontinued        04/08/22 2011 04/10/22 1011   04/08/22 0600  ceFEPIme (MAXIPIME) 2 g in sodium chloride 0.9 % 100 mL IVPB  Status:  Discontinued        04/08/22 0024 04/08/22 2011   04/08/22 0015  metroNIDAZOLE (FLAGYL) IVPB 500 mg  Status:  Discontinued        04/08/22 0008 04/08/22 2011   04/07/22 2230  piperacillin-tazobactam (ZOSYN) IVPB 3.375 g        04/07/22 2224 04/07/22 2318   04/07/22 2230  vancomycin (VANCOCIN) IVPB 1000 mg/200 mL premix        04/07/22 2224 04/08/22 0049         Devices    LINES / TUBES:      Continuous Infusions:  meropenem (MERREM) IV 2 g (04/13/22 0912)   promethazine (PHENERGAN) injection (IM or IVPB)       Objective: Vitals:   04/12/22 1414 04/12/22 2202 04/13/22 0428 04/13/22 0901  BP: 131/61 139/64 (!) 144/66 (!) 150/71  Pulse: 64 (!) 56 (!) 53   Resp:  20 19   Temp: 98.4 F (36.9  C) 98.9 F (37.2 C) 98.1 F (36.7 C)   TempSrc: Oral Oral    SpO2:  96% 98%   Weight:      Height:        Intake/Output Summary (Last 24 hours) at 04/13/2022 0933 Last data filed at 04/13/2022 0110 Gross per 24 hour  Intake 690 ml  Output --  Net 690 ml   Filed Weights   04/07/22 2055 04/09/22 0400  Weight: 82 kg 90.4 kg    Examination:  General: A/O x4 No acute respiratory distress Eyes: negative scleral hemorrhage, negative anisocoria, negative icterus ENT: Negative Runny nose, negative gingival bleeding, Neck:  Negative scars, masses, torticollis, lymphadenopathy, JVD Lungs: Clear to auscultation bilaterally without wheezes or crackles Cardiovascular: Regular rate and rhythm without murmur gallop or rub normal S1 and S2 Abdomen: negative abdominal pain, nondistended, positive soft, bowel sounds, no rebound, no ascites, no appreciable mass Extremities: No significant cyanosis, clubbing, or edema bilateral lower extremities Skin: Positive rash, across front of chest, pruritic Psychiatric:  Negative depression, negative anxiety, negative fatigue, negative mania  Central nervous system:  Cranial nerves II through XII intact, tongue/uvula midline, all extremities muscle strength 5/5, sensation intact throughout,negative dysarthria, negative expressive aphasia, negative receptive aphasia.  .     Data Reviewed: Care during the described time interval was provided by me .  I have reviewed this patient's available data, including medical history, events of note, physical examination, and all test results as part of my evaluation.  CBC: Recent Labs  Lab 04/09/22 0914 04/10/22 0916 04/11/22 0415 04/12/22 0849 04/13/22 0353  WBC 15.7* 12.0* 9.4 7.9 6.7  NEUTROABS 13.0* 9.1* 6.4 5.7 4.2  HGB 12.2 12.1 11.2* 11.9* 11.4*  HCT 36.4 35.3* 33.9* 35.1* 34.0*  MCV 95.3 93.6 94.2 93.4 93.4  PLT 251 250 241 246 [id number]   Basic Metabolic Panel: Recent Labs  Lab 04/09/22  [id number]  04/10/22 0916 04/11/22 0415 04/12/22 0849 04/13/22 0353  NA 138 139 141 138 139  K 3.6 3.3* 3.0* 3.9 3.6  CL 105 103 107 106 105  CO2 26 28 27 27 29   GLUCOSE 121* 122* 134* 107* 100*  BUN 16 15 11 8  7*  CREATININE 0.51 0.51 0.70 0.55 0.59  CALCIUM 9.4 8.9 8.7* 8.7* 8.9  MG 2.2 2.3 1.9 2.2 2.2  PHOS 2.1* 2.1* 2.6 2.1* 2.5   GFR: Estimated Creatinine Clearance: 68.6 mL/min (by C-G formula based on SCr of 0.59 mg/dL). Liver Function Tests: Recent Labs  Lab 04/09/22 0914 04/10/22 0916 04/11/22 0415 04/12/22 0849 04/13/22 0353  AST 17 16 20 29 21   ALT 16 14 15 21 21   ALKPHOS 91 84 74 90 81  BILITOT 1.0 0.8 0.5 0.7 0.6  PROT 6.7 6.2* 5.4* 5.9* 5.7*  ALBUMIN 3.0* 2.7* 2.4* 2.6* 2.5*   No results for input(s): LIPASE, AMYLASE in the last 168 hours. No results for input(s): AMMONIA in the last 168 hours. Coagulation Profile: Recent Labs  Lab 04/07/22 2105 04/08/22 0557  INR 1.0 1.2   Cardiac Enzymes: No results for input(s): CKTOTAL, CKMB, CKMBINDEX, TROPONINI in the last 168 hours. BNP (last 3 results) No results for input(s): PROBNP in the last 8760 hours. HbA1C: No results for input(s): HGBA1C in the last 72 hours. CBG: Recent Labs  Lab 04/09/22 1851  GLUCAP 88   Lipid Profile: No results for input(s): CHOL, HDL, LDLCALC, TRIG, CHOLHDL, LDLDIRECT in the last 72 hours. Thyroid Function Tests: No results for input(s): TSH, T4TOTAL, FREET4, T3FREE, THYROIDAB in the last 72 hours. Anemia Panel: No results for input(s): VITAMINB12, FOLATE, FERRITIN, TIBC, IRON, RETICCTPCT in the last 72 hours. Sepsis Labs: Recent Labs  Lab 04/07/22 2105 04/08/22 0557 04/10/22 0920 04/11/22 0415 04/12/22 0432  PROCALCITON  --  1.43 0.51 0.23 0.10  LATICACIDVEN 1.9  --   --   --   --     Recent Results (from the past 240 hour(s))  SARS Coronavirus 2 by RT PCR (hospital order, performed in [facility] hospital lab) *cepheid single result test*     Status: None   Collection  Time: 04/07/22  9:02 PM   Specimen: Nasal Swab  Result Value Ref Range Status   SARS Coronavirus 2 by RT PCR NEGATIVE NEGATIVE Final    Comment: (NOTE) SARS-CoV-2 target nucleic acids are NOT DETECTED.  The SARS-CoV-2 RNA is generally detectable in upper and lower respiratory specimens during the acute phase of infection. The lowest concentration of SARS-CoV-2 viral copies this assay can detect is 250 copies / mL. A negative result does not preclude SARS-CoV-2 infection and should not be used as the sole basis for treatment or other patient management decisions.  A negative result may occur with improper specimen collection / handling, submission of specimen other than nasopharyngeal swab, presence of viral mutation(s) within the areas targeted by this assay, and inadequate number of viral copies (<250 copies / mL). A negative result must be combined with clinical observations, patient history, and epidemiological information.  Fact Sheet for Patients:   https://www.patel.info/  Fact Sheet for Healthcare Providers: https://hall.com/  This test is not yet approved or  cleared by the [place] FDA and has been authorized for detection and/or diagnosis of SARS-CoV-2 by FDA under an Emergency Use Authorization (EUA).  This EUA will remain in effect (meaning this test can be used) for the duration of the COVID-19 declaration under Section 564(b)(1)  of the Act, 21 U.S.C. section 360bbb-3(b)(1), unless the authorization is terminated or revoked sooner.  Performed at [facility], [place] [address], [place], [place] [address]   Culture, blood (Routine x 2)     Status: Abnormal (Preliminary result)   Collection Time: 04/07/22  9:05 PM   Specimen: BLOOD  Result Value Ref Range Status   Specimen Description   Final    BLOOD LEFT ANTECUBITAL Performed at [facility] [address], [place], [place]  [address]    Special Requests   Final    BOTTLES DRAWN AEROBIC AND ANAEROBIC Blood Culture adequate volume Performed at [facility] [address], [place], [place] [address]    Culture  Setup Time   Final    GRAM NEGATIVE COCCOBACILLI IN BOTH AEROBIC AND ANAEROBIC BOTTLES CRITICAL RESULT CALLED TO, READ BACK BY AND VERIFIED WITH: [place] ON 04/08/22 @ 1952 BY DRT    Culture (A)  Final    HAEMOPHILUS INFLUENZAE BETA LACTAMASE NEGATIVE HEALTH DEPARTMENT NOTIFIED Referred to [facility] in [place], [place] for serotyping. Performed at [facility], [place] [address] [place], [place] [address]    Report Status PENDING  Incomplete  Blood Culture ID Panel (Reflexed)     Status: Abnormal   Collection Time: 04/07/22  9:05 PM  Result Value Ref Range Status   Enterococcus faecalis NOT DETECTED NOT DETECTED Final   Enterococcus Faecium NOT DETECTED NOT DETECTED Final   Listeria monocytogenes NOT DETECTED NOT DETECTED Final   Staphylococcus species NOT DETECTED NOT DETECTED Final   Staphylococcus aureus (BCID) NOT DETECTED NOT DETECTED Final   Staphylococcus epidermidis NOT DETECTED NOT DETECTED Final   Staphylococcus lugdunensis NOT DETECTED NOT DETECTED Final   Streptococcus species NOT DETECTED NOT DETECTED Final   Streptococcus agalactiae NOT DETECTED NOT DETECTED Final   Streptococcus pneumoniae NOT DETECTED NOT DETECTED Final   Streptococcus pyogenes NOT DETECTED NOT DETECTED Final   A.calcoaceticus-baumannii NOT DETECTED NOT DETECTED Final   Bacteroides fragilis NOT DETECTED NOT DETECTED Final   Enterobacterales NOT DETECTED NOT DETECTED Final   Enterobacter cloacae complex NOT DETECTED NOT DETECTED Final   Escherichia coli NOT DETECTED NOT DETECTED Final   Klebsiella aerogenes NOT DETECTED NOT DETECTED Final   Klebsiella oxytoca NOT DETECTED NOT DETECTED Final   Klebsiella pneumoniae NOT DETECTED NOT DETECTED Final   Proteus species NOT DETECTED  NOT DETECTED Final   Salmonella species NOT DETECTED NOT DETECTED Final   Serratia marcescens NOT DETECTED NOT DETECTED Final   Haemophilus influenzae DETECTED (A) NOT DETECTED Final    Comment: CRITICAL RESULT CALLED TO, READ BACK BY AND VERIFIED WITH: MARY ASHLYN TUCKER ON 04/08/22 @ 1952 BY DRT    Neisseria meningitidis NOT DETECTED NOT DETECTED Final   Pseudomonas aeruginosa NOT DETECTED NOT DETECTED Final   Stenotrophomonas maltophilia NOT DETECTED NOT DETECTED Final   Candida albicans NOT DETECTED NOT DETECTED Final   Candida auris NOT DETECTED NOT DETECTED Final   Candida glabrata NOT DETECTED NOT DETECTED Final   Candida krusei NOT DETECTED NOT DETECTED Final   Candida parapsilosis NOT DETECTED NOT DETECTED Final   Candida tropicalis NOT DETECTED NOT DETECTED Final   Cryptococcus neoformans/gattii NOT DETECTED NOT DETECTED Final    Comment: Performed at [facility], [address], [place], [place] [address]  Culture, blood (Routine x 2)     Status: Abnormal   Collection Time: 04/07/22  9:16 PM   Specimen:  BLOOD  Result Value Ref Range Status   Specimen Description   Final    BLOOD BLOOD LEFT HAND Performed at [facility] [address], [place], [place] [address]    Special Requests   Final    BOTTLES DRAWN AEROBIC ONLY Blood Culture results may not be optimal due to an inadequate volume of blood received in culture bottles Performed at [facility] [address] [place], [place] [address]    Culture  Setup Time   Final    GRAM NEGATIVE COCCOBACILLI AEROBIC BOTTLE ONLY CRITICAL RESULT CALLED TO, READ BACK BY AND VERIFIED WITH: [place] ON 04/08/22 @ 1958 BY DRT    Culture (A)  Final    HAEMOPHILUS INFLUENZAE BETA LACTAMASE NEGATIVE HEALTH DEPARTMENT NOTIFIED Performed at [facility], [place] [address], [place], [place] [address]    Report Status 04/11/2022 FINAL  Final  Urine Culture     Status:  Abnormal   Collection Time: 04/08/22  3:26 AM   Specimen: Urine, Clean Catch  Result Value Ref Range Status   Specimen Description   Final    URINE, CLEAN CATCH Performed at [facility], [place] [address] [place], [place] [address]    Special Requests   Final    NONE Performed at [facility], [place] [address] [place], [place] [address]    Culture >=100,000 COLONIES/mL KLEBSIELLA PNEUMONIAE (A)  Final   Report Status 04/11/2022 FINAL  Final   Organism ID, Bacteria KLEBSIELLA PNEUMONIAE (A)  Final      Susceptibility   Klebsiella pneumoniae - MIC*    AMPICILLIN >=32 RESISTANT Resistant     CEFAZOLIN <=4 SENSITIVE Sensitive     CEFEPIME <=0.12 SENSITIVE Sensitive     CEFTRIAXONE <=0.25 SENSITIVE Sensitive     CIPROFLOXACIN <=0.25 SENSITIVE Sensitive     GENTAMICIN <=1 SENSITIVE Sensitive     IMIPENEM <=0.25 SENSITIVE Sensitive     NITROFURANTOIN <=16 SENSITIVE Sensitive     TRIMETH/SULFA <=20 SENSITIVE Sensitive     AMPICILLIN/SULBACTAM 16 INTERMEDIATE Intermediate     PIP/TAZO <=4 SENSITIVE Sensitive     * >=100,000 COLONIES/mL KLEBSIELLA PNEUMONIAE  CSF culture w Gram Stain     Status: None (Preliminary result)   Collection Time: 04/09/22  4:21 PM   Specimen: CSF; Cerebrospinal Fluid  Result Value Ref Range Status   Specimen Description   Final    CSF Performed at [facility] [address], [place], [place] [address]    Special Requests   Final    NONE Performed at [facility], [place] [address], [place], [place] [address]    Gram Stain   Final    WBC PRESENT,BOTH PMN AND MONONUCLEAR NO ORGANISMS SEEN CYTOSPIN SMEAR Gram Stain Report Called to,Read Back By and Verified With: Harrie Foreman, A Rn on 04/09/22 @ 1722 by Lonia Mad STAIN REVIEWED-AGREE WITH RESULT    Culture   Final    NO GROWTH 3 DAYS Performed at [facility], [place] [address] [place], [place] [address]    Report Status PENDING   Incomplete         Radiology Studies: No results found.      Scheduled Meds:  acetaminophen  325 mg Oral Once   amLODipine  5 mg Oral Daily   enoxaparin (LOVENOX) injection  40 mg Subcutaneous Q24H   hydrocortisone cream   Topical TID   sodium chloride flush  3 mL Intravenous Q12H   Continuous Infusions:  meropenem (MERREM) IV 2 g (04/13/22 0912)   promethazine (PHENERGAN) injection (IM or IVPB)       LOS: 4 days    Time spent:40 min    Maykel Reitter, Geraldo Docker, MD [facility]   If 7PM-7AM, please contact night-coverage 04/13/2022, 9:33 AM

## 2022-04-13 NOTE — Care Management Important Message (Signed)
Important Message  Patient Details IM Letter placed in Patients room. Name: Jenny Krueger MRN: [id number] Date of Birth: 03-19-1946   Medicare Important Message Given:  Yes     Caren Macadam 04/13/2022, 12:09 PM

## 2022-04-13 NOTE — TOC Progression Note (Signed)
Transition of Care [facility]) - Progression Note    Patient Details  Name: Jenny Krueger MRN: [id number] Date of Birth: Mar 24, 1946  Transition of Care [facility]) CM/SW Contact  Golda Acre, RN Phone Number: 04/13/2022, 7:56 AM  Clinical Narrative:    Following for toc needs    Expected Discharge Plan: Home/Self Care Barriers to Discharge: No Barriers Identified  Expected Discharge Plan and Services Expected Discharge Plan: Home/Self Care     Post Acute Care Choice: Home Health Living arrangements for the past 2 months: Single Family Home                                       Social Determinants of Health (SDOH) Interventions    Readmission Risk Interventions     View : No data to display.

## 2022-04-14 DIAGNOSIS — E669 Obesity, unspecified: Secondary | ICD-10-CM

## 2022-04-14 LAB — COMPREHENSIVE METABOLIC PANEL
ALT: 21 U/L (ref 0–44)
AST: 20 U/L (ref 15–41)
Albumin: 2.7 g/dL — ABNORMAL LOW (ref 3.5–5.0)
Alkaline Phosphatase: 79 U/L (ref 38–126)
Anion gap: 6 (ref 5–15)
BUN: 7 mg/dL — ABNORMAL LOW (ref 8–23)
CO2: 26 mmol/L (ref 22–32)
Calcium: 8.5 mg/dL — ABNORMAL LOW (ref 8.9–10.3)
Chloride: 105 mmol/L (ref 98–111)
Creatinine, Ser: 0.44 mg/dL (ref 0.44–1.00)
GFR, Estimated: >60 mL/min (ref >60)
Glucose, Bld: 88 mg/dL (ref 70–99)
Potassium: 3.5 mmol/L (ref 3.5–5.1)
Sodium: 137 mmol/L (ref 135–145)
Total Bilirubin: 0.5 mg/dL (ref 0.3–1.2)
Total Protein: 5.7 g/dL — ABNORMAL LOW (ref 6.5–8.1)

## 2022-04-14 LAB — CBC WITH DIFFERENTIAL/PLATELET
Abs Immature Granulocytes: 0.1 10*3/uL — ABNORMAL HIGH (ref 0.00–0.07)
Basophils Absolute: 0 10*3/uL (ref 0.0–0.1)
Basophils Relative: 1 %
Eosinophils Absolute: 0.4 10*3/uL (ref 0.0–0.5)
Eosinophils Relative: 8 %
HCT: 34.3 % — ABNORMAL LOW (ref 36.0–46.0)
Hemoglobin: 11.5 g/dL — ABNORMAL LOW (ref 12.0–15.0)
Immature Granulocytes: 2 %
Lymphocytes Relative: 25 %
Lymphs Abs: 1.3 10*3/uL (ref 0.7–4.0)
MCH: 31.6 pg (ref 26.0–34.0)
MCHC: 33.5 g/dL (ref 30.0–36.0)
MCV: 94.2 fL (ref 80.0–100.0)
Monocytes Absolute: 0.6 10*3/uL (ref 0.1–1.0)
Monocytes Relative: 12 %
Neutro Abs: 2.7 10*3/uL (ref 1.7–7.7)
Neutrophils Relative %: 52 %
Platelets: 267 10*3/uL (ref 150–400)
RBC: 3.64 MIL/uL — ABNORMAL LOW (ref 3.87–5.11)
RDW: 12.6 % (ref 11.5–15.5)
WBC: 5.1 10*3/uL (ref 4.0–10.5)
nRBC: 0 % (ref 0.0–0.2)

## 2022-04-14 LAB — PHOSPHORUS: Phosphorus: 2.5 mg/dL (ref 2.5–4.6)

## 2022-04-14 LAB — MAGNESIUM: Magnesium: 2.1 mg/dL (ref 1.7–2.4)

## 2022-04-14 MED ORDER — OXYCODONE HCL 5 MG PO TABS
5.0000 mg | ORAL_TABLET | Freq: Four times a day (QID) | ORAL | 0 refills | Status: DC | PRN
Start: 2022-04-14 — End: 2023-04-07

## 2022-04-14 MED ORDER — AMLODIPINE BESYLATE 10 MG PO TABS: 10.0000 mg | ORAL_TABLET | Freq: Every day | ORAL | 0 refills | Status: AC

## 2022-04-14 MED ORDER — POLYETHYLENE GLYCOL 3350 17 G PO PACK: 17.0000 g | PACK | Freq: Every day | ORAL | 0 refills | Status: AC | PRN

## 2022-04-14 MED ORDER — ONDANSETRON HCL 4 MG PO TABS: 4.0000 mg | ORAL_TABLET | Freq: Four times a day (QID) | ORAL | 0 refills | Status: AC | PRN

## 2022-04-14 MED ORDER — LEVOFLOXACIN 500 MG PO TABS: 500.0000 mg | ORAL_TABLET | Freq: Every day | ORAL | 0 refills | Status: AC

## 2022-04-14 MED ORDER — HYDROCORTISONE 1 % EX CREA: TOPICAL_CREAM | Freq: Three times a day (TID) | CUTANEOUS | 0 refills | Status: DC

## 2022-04-14 NOTE — TOC Transition Note (Signed)
Transition of Care [facility]) - CM/SW Discharge Note   Patient Details  Name: Jenny Krueger MRN: [id number] Date of Birth: 12-28-1945  Transition of Care [facility]) CM/SW Contact:  Golda Acre, RN Phone Number: 04/14/2022, 1:00 PM   Clinical Narrative:    Dcd to home with no toc needs.   Final next level of care: Home/Self Care Barriers to Discharge: No Barriers Identified   Patient Goals and CMS Choice Patient states their goals for this hospitalization and ongoing recovery are:: To get better CMS Medicare.gov Compare Post Acute Care list provided to:: Patient Choice offered to / list presented to : Patient  Discharge Placement                       Discharge Plan and Services     Post Acute Care Choice: Home Health                               Social Determinants of Health (SDOH) Interventions     Readmission Risk Interventions     No data to display

## 2022-04-14 NOTE — Discharge Summary (Signed)
Physician Discharge Summary  Jenny Krueger [id number] DOB: [birth year] DOA: 04/07/2022  PCP: No primary care provider on file.  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  -ID consulted: Recommend LP -LP done and showed RBC of 1260 on Tube 1 and  21 on Tube 4, WBC elevated of 2 1 being 195 and 2 for being 107 with segmented neutrophils being 63 and 50, glucose of 21 protein of 110 and CSF showing no WBC cultures and VDRL CSF, at bedtime V1 and 2 PCR pending -Continue to monitor and trend and follow clinical course -6/7 continued neck tenderness but no true nuchal rigidity -ID recommends continuing ceftriaxone 2 g every 12 but have now changed to IV Meropenem given Rash;  -6/7 per EMR note ID consulted recommending Oral Levofloxacin 500 mg po Daily to complete 14 Days of Abx    Acute Headache associated with Neck Pain, improving  -Does not really have any nuchal rigidity but continues to complain of posterior pain in her neck as well as head and now that she has H Influenzae Bacteremia it is concerning for Meningitis  -States that she gets headaches when she gets sinus infections and is worse on the right side -Head CT w/o Contrast showed "Atrophy and small-vessel disease with no acute intracranial CT findings. Sinus and right mastoid disease as described above" -MRI Brain w/o Contrast done and showed "No evidence of acute intracranial abnormality" and MRI of the Cervical Spine showed Multilevel degenerative changes of the cervical spine as described above. Moderate to severe neuroforaminal stenosis bilaterally at C5-C6 and on the right at  C6-C7." -6/7 Toradol 30 mg + bolus normal saline 1 L---> headache resolved   Nausea and Vomiting -Given Zofran 4 mg p.o./IV -Resolved   Hyperbilirubinemia -Patient's T. bili is now 1.3 -> 1.0 -> 0.8 -> 0.5 and is now 0.7 -Continue to monitor and trend and continue with IV fluid hydration -Follow-up with PCP.   Hypoalbuminemia -Pateint's Albumin Level went from 3.5 -> 3.1 -> 3.0 -> 2.7 -> 2.4 -Follow-up with PCP  Essential HTN -6/7 increase home Amlodipine 10 mg daily -Hold home Losartan-Hydrochlorothiazide    Hypokalemia -Mild and in the setting of nausea vomiting -Potassium has gone from 3.2 -> 3.6 -> 3.3 -> 3.0  -> 3.9  -Continue to monitor and replete as necessary -Resolved.   Rash, improving slowly  -Noted to be likely a drug reaction given that she has multiple allergies be on her chest and will start Benadryl for her itching as well as hydrocortisone cream -Antibiotics have been changed from ceftriaxone to IV meropenem and per ID recommending oral Levaquin equivalent at discharge for 14 days   Hypophosphatemia -Phosphorus goal> 2.5   Obesity -Complicates overall prognosis and care -Estimated body mass index is 33.16 kg/m as calculated from the following: -Weight Loss and Dietary Counseling given   Discharge Diagnoses:  Principal Problem:   SIRS (systemic inflammatory response syndrome) (HCC) Active Problems:   Essential hypertension   Discharge Condition: Stable  Diet recommendation: Regular  Filed Weights   04/07/22 2055 04/09/22 0400  Weight: 82 kg 90.4 kg    History of present illness:  Jenny Krueger is a 76 y.o. WF PMHx  HTN, HLD, Hypothyroidism, degenerative disc disease, Fibromyalgia    Presenting with altered mental status.  History obtained with assistance of chart review and family.  Patient had been seemingly at baseline but was noted to be altered today when visiting with family.  Has had recent sinus infections.  Earlier today was  noted to have increased somnolence after taking Benadryl.  Upon waking did have episode of nausea and vomiting. Also reports some intermittent fevers, and headaches.   Denies chest pain, shortness of breath, abdominal pain, constipation, diarrhea  Hospital Course:  See above   Consultations: ID  Antibiotics Anti-infectives (From admission, onward)    Start     Ordered Stop   04/13/22 1845  levofloxacin (LEVAQUIN) tablet 500 mg        04/13/22 1745     04/10/22 2200  cefTRIAXone (ROCEPHIN) 2 g in sodium chloride 0.9 % 100 mL IVPB  Status:  Discontinued        04/10/22 1011 04/10/22 1606   04/10/22 1700  meropenem (MERREM) 2 g in sodium chloride 0.9 % 100 mL IVPB  Status:  Discontinued        04/10/22 1606 04/13/22 1745   04/08/22 2200  vancomycin (VANCOCIN) IVPB 1000 mg/200 mL premix  Status:  Discontinued        04/08/22 0024 04/08/22 2011   04/08/22 2200  cefTRIAXone (ROCEPHIN) 2 g in sodium chloride 0.9 % 100 mL IVPB  Status:  Discontinued        04/08/22 2011 04/10/22 1011   04/08/22 0600  ceFEPIme (MAXIPIME) 2 g in sodium chloride 0.9 % 100 mL IVPB  Status:  Discontinued        04/08/22 0024 04/08/22 2011   04/08/22 0015  metroNIDAZOLE (FLAGYL) IVPB 500 mg  Status:  Discontinued        04/08/22 0008 04/08/22 2011   04/07/22 2230  piperacillin-tazobactam (ZOSYN) IVPB 3.375 g        04/07/22 2224 04/07/22 2318   04/07/22 2230  vancomycin (VANCOCIN) IVPB 1000 mg/200 mL premix        04/07/22 2224 04/08/22 0049         Discharge Exam: Vitals:   04/13/22 1222 04/13/22 2112 04/14/22 0603 04/14/22 1208  BP: 138/68 (!) 143/67 (!) 154/61 (!) 152/67  Pulse: (!) 58 (!) 57 (!) 57 (!) 58  Resp: 18 16 18 18   Temp: 98.5 F (36.9 C) 98.5 F (36.9 C) 98.5 F (36.9 C) 97.7 F (36.5 C)  TempSrc: Oral Oral Oral Oral  SpO2: 97% 96% 95% 98%  Weight:      Height:        General: A/O x4 No acute respiratory distress Eyes: negative scleral hemorrhage, negative anisocoria, negative  icterus ENT: Negative Runny nose, negative gingival bleeding, Neck:  Negative scars, masses, torticollis, lymphadenopathy, JVD Lungs: Clear to auscultation bilaterally without wheezes or crackles Cardiovascular: Regular rate and rhythm without murmur gallop or rub normal S1 and S2  Discharge Instructions   Allergies as of 04/14/2022       Reactions   Cefepime Rash   Ceftriaxone Rash   Codeine Nausea Only   Penicillin G Rash   Other reaction(s): rash        Medication List     STOP taking these medications    ibuprofen 200 MG tablet Commonly known as: ADVIL   losartan 50 MG tablet Commonly known as: COZAAR       TAKE these medications    amLODipine 10 MG tablet  Commonly known as: NORVASC Take 1 tablet (10 mg total) by mouth daily. What changed:  medication strength how much to take   hydrocortisone cream 1 % Apply topically 3 (three) times daily.   levofloxacin 500 MG tablet Commonly known as: LEVAQUIN Take 1 tablet (500 mg total) by mouth daily.   ondansetron 4 MG tablet Commonly known as: ZOFRAN Take 1 tablet (4 mg total) by mouth every 6 (six) hours as needed for nausea.   oxyCODONE 5 MG immediate release tablet Commonly known as: Oxy IR/ROXICODONE Take 1 tablet (5 mg total) by mouth every 6 (six) hours as needed for moderate pain.   polyethylene glycol 17 g packet Commonly known as: MIRALAX / GLYCOLAX Take 17 g by mouth daily as needed for mild constipation.   traMADol 50 MG tablet Commonly known as: ULTRAM Take 50 mg by mouth every 6 (six) hours.       Allergies  Allergen Reactions   Cefepime Rash   Ceftriaxone Rash   Codeine Nausea Only   Penicillin G Rash    Other reaction(s): rash      The results of significant diagnostics from this hospitalization (including imaging, microbiology, ancillary and laboratory) are listed below for reference.    Significant Diagnostic Studies: MR CERVICAL SPINE WO CONTRAST  Result Date:  04/08/2022 CLINICAL DATA:  Chronic neck pain. EXAM: MRI CERVICAL SPINE WITHOUT CONTRAST TECHNIQUE: Multiplanar, multisequence MR imaging of the cervical spine was performed. No intravenous contrast was administered. COMPARISON:  None Available. FINDINGS: Alignment: Physiologic. Vertebrae: No fracture, evidence of discitis, or bone lesion. Cord: Normal signal and morphology. Posterior Fossa, vertebral arteries, paraspinal tissues: Negative. Disc levels: C2-C3: Negative disc. Minimal facet uncovertebral hypertrophy. No stenosis. C3-C4: Tiny posterior disc osteophyte complex and mild bilateral uncovertebral hypertrophy. Mild left neuroforaminal stenosis. No spinal canal or right neuroforaminal stenosis. C4-C5: Small posterior disc osteophyte complex. Mild right facet uncovertebral hypertrophy. Mild spinal canal and right neuroforaminal stenosis. No left neuroforaminal stenosis. C5-C6: Small posterior disc osteophyte complex with superimposed broad-based central disc protrusion. Mild bilateral uncovertebral hypertrophy. Mild spinal canal stenosis. Moderate to severe bilateral neuroforaminal stenosis. C6-C7: Mild disc bulging with superimposed right foraminal disc protrusion. Mild bilateral uncovertebral hypertrophy. Mild spinal canal stenosis. Moderate to severe right and mild left neuroforaminal stenosis. C7-T1: Negative disc. Moderate right and mild left facet arthropathy. No stenosis. IMPRESSION: 1. Multilevel degenerative changes of the cervical spine as described above. 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 Electronically Signed   By: Titus Dubin M.D.   On: 04/08/2022 12:00   MR BRAIN WO CONTRAST  Result Date: 04/08/2022 CLINICAL DATA:  Mental status change, unknown cause Headache, new or worsening (Age >= 50y) Sinusitis, rapid progression EXAM: MRI HEAD WITHOUT CONTRAST TECHNIQUE: Multiplanar, multiecho pulse sequences of the brain and surrounding  structures were obtained without intravenous contrast. COMPARISON:  CT head April 07, 2022. FINDINGS: Brain: No acute infarction, hemorrhage, hydrocephalus, extra-axial collection or mass lesion. Mild for age scattered T2/FLAIR hyperintensities in the white matter, nonspecific but compatible with chronic microvascular ischemic disease. Vascular: Major arterial flow voids are maintained at the skull base. Skull and upper cervical spine: Normal marrow signal. Sinuses/Orbits: Mild paranasal sinus mucosal thickening. No acute orbital findings. Other: No mastoid effusions. IMPRESSION: No evidence of acute intracranial abnormality. Electronically Signed   By: Margaretha Sheffield M.D.   On: 04/08/2022 11:36   CT HEAD WO CONTRAST (5MM)  Result Date: 04/07/2022 CLINICAL DATA:  Mental status change,  unknown cause. EXAM: CT HEAD WITHOUT CONTRAST TECHNIQUE: Contiguous axial images were obtained from the base of the skull through the vertex without intravenous contrast. RADIATION DOSE REDUCTION: This exam was performed according to the departmental dose-optimization program which includes automated exposure control, adjustment of the mA and/or kV according to patient size and/or use of iterative reconstruction technique. COMPARISON:  None Available. FINDINGS: Brain: There is mild cerebral atrophy, small-vessel disease and atrophic ventriculomegaly. The cerebellum and brainstem are unremarkable. There is no focal asymmetry worrisome for acute cortical based infarct, hemorrhage, mass effect or midline shift. Basal cisterns are patent. Vascular: There are patchy calcifications in the carotid siphons but no hyperdense central vasculature. Skull: No fracture or focal lesion. Sinuses/Orbits: There is mild membrane thickening in the paranasal sinuses without fluid levels. There has been a prior right lens extraction. There is patchy fluid in the lower right mastoid air cells. Remaining mastoid air cells are clear. Other: None.  IMPRESSION: Atrophy and small-vessel disease with no acute intracranial CT findings. Sinus and right mastoid disease as described above. Electronically Signed   By: Telford Nab M.D.   On: 04/07/2022 21:56   DG Chest Port 1 View  Result Date: 04/07/2022 CLINICAL DATA:  Fever, vomiting, diarrhea, suspected sepsis and altered mental status. EXAM: PORTABLE CHEST 1 VIEW COMPARISON:  October 18, 2018 FINDINGS: The heart size and mediastinal contours are within normal limits. Both lungs are clear. The visualized skeletal structures are unremarkable. IMPRESSION: No active disease. Electronically Signed   By: Virgina Norfolk M.D.   On: 04/07/2022 21:36    Microbiology: Recent Results (from the past 240 hour(s))  CSF culture w Gram Stain     Status: None   Collection Time: 04/09/22  4:21 PM   Specimen: CSF; Cerebrospinal Fluid  Result Value Ref Range Status   Specimen Description   Final    CSF Performed at [facility] [address] [place], [place] [address]    Special Requests   Final    NONE Performed at [facility], [place] [address] [place], [place] [address]    Gram Stain   Final    WBC PRESENT,BOTH PMN AND MONONUCLEAR NO ORGANISMS SEEN CYTOSPIN SMEAR Gram Stain Report Called to,Read Back By and Verified With: Harrie Foreman, A Rn on 04/09/22 @ 1722 by Lonia Mad STAIN REVIEWED-AGREE WITH RESULT    Culture   Final    NO GROWTH 3 DAYS Performed at [facility], [place] [address] [place],  [address]    Report Status 04/13/2022 FINAL  Final     Labs: Basic Metabolic Panel: Recent Labs  Lab 04/13/22 0353 04/14/22 0418  NA 139 137  K 3.6 3.5  CL 105 105  CO2 29 26  GLUCOSE 100* 88  BUN 7* 7*  CREATININE 0.59 0.44  CALCIUM 8.9 8.5*  MG 2.2 2.1  PHOS 2.5 2.5   Liver Function Tests: Recent Labs  Lab 04/13/22 0353 04/14/22 0418  AST 21 20  ALT 21 21  ALKPHOS 81 79  BILITOT 0.6 0.5  PROT 5.7* 5.7*  ALBUMIN 2.5* 2.7*    No results for input(s): "LIPASE", "AMYLASE" in the last 168 hours. No results for input(s): "AMMONIA" in the last 168 hours. CBC: Recent Labs  Lab 04/13/22 0353 04/14/22 0418  WBC 6.7 5.1  NEUTROABS 4.2 2.7  HGB 11.4* 11.5*  HCT 34.0* 34.3*  MCV 93.4 94.2  PLT 256 267   Cardiac Enzymes: No results for input(s): "CKTOTAL", "  CKMB", "CKMBINDEX", "TROPONINI" in the last 168 hours. BNP: BNP (last 3 results) No results for input(s): "BNP" in the last 8760 hours.  ProBNP (last 3 results) No results for input(s): "PROBNP" in the last 8760 hours.  CBG: No results for input(s): "GLUCAP" in the last 168 hours.      Signed:  Dia Crawford, MD [facility]

## 2022-04-14 NOTE — Plan of Care (Signed)
PIV removed. DC paperwork reviewed. Pt belongings gathered by pt and family.   Problem: Education: Goal: Knowledge of General Education information will improve Description: Including pain rating scale, medication(s)/side effects and non-pharmacologic comfort measures Outcome: Completed/Met   Problem: Clinical Measurements: Goal: Ability to maintain clinical measurements within normal limits will improve Outcome: Completed/Met Goal: Will remain free from infection Outcome: Completed/Met Goal: Diagnostic test results will improve Outcome: Completed/Met   Problem: Activity: Goal: Risk for activity intolerance will decrease Outcome: Completed/Met   Problem: Pain Managment: Goal: General experience of comfort will improve Outcome: Completed/Met   Problem: Safety: Goal: Ability to remain free from injury will improve Outcome: Completed/Met   Problem: Skin Integrity: Goal: Risk for impaired skin integrity will decrease Outcome: Completed/Met

## 2022-04-21 LAB — CULTURE, BLOOD (ROUTINE X 2): Special Requests: ADEQUATE

## 2022-08-19 ENCOUNTER — Ambulatory Visit: Admit: 2022-08-19 | Payer: Medicare Other | Admitting: Orthopedic Surgery

## 2022-08-19 SURGERY — ARTHROPLASTY, KNEE, TOTAL
Anesthesia: Spinal | Site: Knee | Laterality: Right

## 2023-03-09 NOTE — Patient Instructions (Addendum)
DUE TO COVID-19 ONLY TWO VISITORS  (aged 76 and older)  ARE ALLOWED TO COME WITH YOU AND STAY IN THE WAITING ROOM ONLY DURING PRE OP AND PROCEDURE.   **NO VISITORS ARE ALLOWED IN THE SHORT STAY AREA OR RECOVERY ROOM!!**  IF YOU WILL BE ADMITTED INTO THE HOSPITAL YOU ARE ALLOWED ONLY FOUR SUPPORT PEOPLE DURING VISITATION HOURS ONLY (7 AM -8PM)   The support person(s) must pass our screening, gel in and out, and wear a mask at all times, including in the patient's room. Patients must also wear a mask when staff or their support person are in the room. Visitors GUEST BADGE MUST BE WORN VISIBLY  One adult visitor may remain with you overnight and MUST be in the room by 8 P.M.     Your procedure is scheduled on: 04/07/23   Report to [facility] Main Entrance    Report to admitting at  11:15 AM   Call this number if you have problems the morning of surgery [phone]   Do not eat food :After Midnight.   After Midnight you may have the following liquids until _10:45 AM/ DAY OF SURGERY  Water Black Coffee (sugar ok, NO MILK/CREAM OR CREAMERS)  Tea (sugar ok, NO MILK/CREAM OR CREAMERS) regular and decaf                             Plain Jell-O (NO RED)                                           Fruit ices (not with fruit pulp, NO RED)                                     Popsicles (NO RED)                                                                  Juice: apple, WHITE grape, WHITE cranberry Sports drinks like Gatorade (NO RED)                   The day of surgery:  Drink ONE (1) Pre-Surgery Clear Ensure  at 10:30 AM the morning of surgery. Drink in one sitting. Do not sip.  This drink was given to you during your hospital  pre-op appointment visit. Nothing else to drink after completing the  Pre-Surgery Clear Ensure at 10:45 AM          If you have questions, please contact your surgeon's office.      Oral Hygiene is also important to reduce your risk of infection.                                     Remember - BRUSH YOUR TEETH THE MORNING OF SURGERY WITH YOUR REGULAR TOOTHPASTE  DENTURES WILL BE REMOVED PRIOR TO SURGERY PLEASE DO NOT APPLY "Poly grip" OR ADHESIVES!!!      Take these medicines the morning  of surgery with A SIP OF WATER: Pain med if needed ( tylenol)                                                                                                                                                                                                                                                               You may not have any metal on your body including hair pins, jewelry, and body piercing             Do not wear make-up, lotions, powders, perfumes/, or deodorant  Do not wear nail polish including gel and S&S, artificial/acrylic nails, or any other type of covering on natural nails including finger and toenails. If you have artificial nails, gel coating, etc. that needs to be removed by a nail salon please have this removed prior to surgery or surgery may need to be canceled/ delayed if the surgeon/ anesthesia feels like they are unable to be safely monitored.   Do not shave  48 hours prior to surgery.     Do not bring valuables to the hospital. [facility] IS NOT             RESPONSIBLE   FOR VALUABLES.   Contacts, glasses, or bridgework may not be worn into surgery.   Bring small overnight bag day of surgery.   DO NOT BRING YOUR HOME MEDICATIONS TO THE HOSPITAL. PHARMACY WILL DISPENSE MEDICATIONS LISTED ON YOUR MEDICATION LIST TO YOU DURING YOUR ADMISSION IN THE HOSPITAL!     Special Instructions: Bring a copy of your healthcare power of attorney and living will documents the day of surgery if you haven't scanned them before.              Please read over the following fact sheets you were given: IF YOU HAVE QUESTIONS ABOUT YOUR PRE-OP INSTRUCTIONS PLEASE CALL [phone]    Pre-operative 5 CHG Bath Instructions   You can play a  key role in reducing the risk of infection after surgery. Your skin needs to be as free of germs as possible. You can reduce the number of germs on your skin by washing with CHG (chlorhexidine gluconate) soap before surgery. CHG is an antiseptic soap that kills germs and continues to kill germs even after washing.  DO NOT use if you have an allergy to chlorhexidine/CHG or antibacterial soaps. If your skin becomes reddened or irritated, stop using the CHG and notify one of our RNs at [phone].   Please shower with the CHG soap starting 4 days before surgery using the following schedule:     Please keep in mind the following:  DO NOT shave, including legs and underarms, starting the day of your first shower.   You may shave your face at any point before/day of surgery.  Place clean sheets on your bed the day you start using CHG soap. Use a clean washcloth (not used since being washed) for each shower. DO NOT sleep with pets once you start using the CHG.   CHG Shower Instructions:  If you choose to wash your hair and private area, wash first with your normal shampoo/soap.  After you use shampoo/soap, rinse your hair and body thoroughly to remove shampoo/soap residue.  Turn the water OFF and apply about 3 tablespoons (45 ml) of CHG soap to a CLEAN washcloth.  Apply CHG soap ONLY FROM YOUR NECK DOWN TO YOUR TOES (washing for 3-5 minutes)  DO NOT use CHG soap on face, private areas, open wounds, or sores.  Pay special attention to the area where your surgery is being performed.  If you are having back surgery, having someone wash your back for you may be helpful. Wait 2 minutes after CHG soap is applied, then you may rinse off the CHG soap.  Pat dry with a clean towel  Put on clean clothes/pajamas   If you choose to wear lotion, please use ONLY the CHG-compatible lotions on the back of this paper.     Additional instructions for the day of surgery: DO NOT APPLY any lotions, deodorants,  cologne, or perfumes.   Put on clean/comfortable clothes.  Brush your teeth.  Ask your nurse before applying any prescription medications to the skin.      CHG Compatible Lotions   Aveeno Moisturizing lotion  Cetaphil Moisturizing Cream  Cetaphil Moisturizing Lotion  Clairol Herbal Essence Moisturizing Lotion, Dry Skin  Clairol Herbal Essence Moisturizing Lotion, Extra Dry Skin  Clairol Herbal Essence Moisturizing Lotion, Normal Skin  Curel Age Defying Therapeutic Moisturizing Lotion with Alpha Hydroxy  Curel Extreme Care Body Lotion  Curel Soothing Hands Moisturizing Hand Lotion  Curel Therapeutic Moisturizing Cream, Fragrance-Free  Curel Therapeutic Moisturizing Lotion, Fragrance-Free  Curel Therapeutic Moisturizing Lotion, Original Formula  Eucerin Daily Replenishing Lotion  Eucerin Dry Skin Therapy Plus Alpha Hydroxy Crme  Eucerin Dry Skin Therapy Plus Alpha Hydroxy Lotion  Eucerin Original Crme  Eucerin Original Lotion  Eucerin Plus Crme Eucerin Plus Lotion  Eucerin TriLipid Replenishing Lotion  Keri Anti-Bacterial Hand Lotion  Keri Deep Conditioning Original Lotion Dry Skin Formula Softly Scented  Keri Deep Conditioning Original Lotion, Fragrance Free Sensitive Skin Formula  Keri Lotion Fast Absorbing Fragrance Free Sensitive Skin Formula  Keri Lotion Fast Absorbing Softly Scented Dry Skin Formula  Keri Original Lotion  Keri Skin Renewal Lotion Keri Silky Smooth Lotion  Keri Silky Smooth Sensitive Skin Lotion  Nivea Body Creamy Conditioning Oil  Nivea Body Extra Enriched Teacher, adult education Moisturizing Lotion Nivea Crme  Nivea Skin Firming Lotion  NutraDerm 30 Skin Lotion  NutraDerm Skin Lotion  NutraDerm Therapeutic Skin Cream  NutraDerm Therapeutic Skin Lotion  ProShield Protective Hand Cream  Provon moisturizing lotion       Incentive Spirometer  An incentive spirometer is  a tool that can help keep your lungs clear  and active. This tool measures how well you are filling your lungs with each breath. Taking long deep breaths may help reverse or decrease the chance of developing breathing (pulmonary) problems (especially infection) following: A long period of time when you are unable to move or be active. BEFORE THE PROCEDURE  If the spirometer includes an indicator to show your best effort, your nurse or respiratory therapist will set it to a desired goal. If possible, sit up straight or lean slightly forward. Try not to slouch. Hold the incentive spirometer in an upright position. INSTRUCTIONS FOR USE  Sit on the edge of your bed if possible, or sit up as far as you can in bed or on a chair. Hold the incentive spirometer in an upright position. Breathe out normally. Place the mouthpiece in your mouth and seal your lips tightly around it. Breathe in slowly and as deeply as possible, raising the piston or the ball toward the top of the column. Hold your breath for 3-5 seconds or for as long as possible. Allow the piston or ball to fall to the bottom of the column. Remove the mouthpiece from your mouth and breathe out normally. Rest for a few seconds and repeat Steps 1 through 7 at least 10 times every 1-2 hours when you are awake. Take your time and take a few normal breaths between deep breaths. The spirometer may include an indicator to show your best effort. Use the indicator as a goal to work toward during each repetition. After each set of 10 deep breaths, practice coughing to be sure your lungs are clear. If you have an incision (the cut made at the time of surgery), support your incision when coughing by placing a pillow or rolled up towels firmly against it. Once you are able to get out of bed, walk around indoors and cough well. You may stop using the incentive spirometer when instructed by your caregiver.  RISKS AND COMPLICATIONS Take your time so you do not get dizzy or light-headed. If you are in  pain, you may need to take or ask for pain medication before doing incentive spirometry. It is harder to take a deep breath if you are having pain. AFTER USE Rest and breathe slowly and easily. It can be helpful to keep track of a log of your progress. Your caregiver can provide you with a simple table to help with this. If you are using the spirometer at home, follow these instructions: SEEK MEDICAL CARE IF:  You are having difficultly using the spirometer. You have trouble using the spirometer as often as instructed. Your pain medication is not giving enough relief while using the spirometer. You develop fever of 100.5 F (38.1 C) or higher. SEEK IMMEDIATE MEDICAL CARE IF:  You cough up bloody sputum that had not been present before. You develop fever of 102 F (38.9 C) or greater. You develop worsening pain at or near the incision site. MAKE SURE YOU:  Understand these instructions. Will watch your condition. Will get help right away if you are not doing well or get worse. Document Released: 03/06/2007 Document Revised: 01/16/2012 Document Reviewed: 05/07/2007 [facility] Patient Information 2014 [place], [place].   ________________________________________________________________________

## 2023-03-15 NOTE — H&P (Signed)
Patient's anticipated LOS is less than 2 midnights, meeting these requirements: - Younger than 36 - Lives within 1 hour of care - Has a competent adult at home to recover with post-op recover - NO history of  - Chronic pain requiring opiods  - Diabetes  - Coronary Artery Disease  - Heart failure  - Heart attack  - Stroke  - DVT/VTE  - Cardiac arrhythmia  - Respiratory Failure/COPD  - Renal failure  - Anemia  - Advanced Liver disease     Jenny Krueger is an 77 y.o. female.    Chief Complaint: right knee pain  HPI: Pt is a 77 y.o. female complaining of right knee pain for multiple years. Pain had continually increased since the beginning. X-rays in the clinic show end-stage arthritic changes of the right knee. Pt has tried various conservative treatments which have failed to alleviate their symptoms, including injections and therapy. Various options are discussed with the patient. Risks, benefits and expectations were discussed with the patient. Patient understand the risks, benefits and expectations and wishes to proceed with surgery.   PCP:  No primary care provider on file.  D/C Plans: Home  PMH: Past Medical History:  Diagnosis Date   Hyperlipidemia    Hypertension    Hypothyroid     PSH: Past Surgical History:  Procedure Laterality Date   No prior surgery      Social History:  reports that she quit smoking about 34 years ago. Her smoking use included cigarettes. She has never used smokeless tobacco. She reports current alcohol use. She reports that she does not use drugs. BMI: Estimated body mass index is 33.16 kg/m as calculated from the following:   Height as of 04/07/22: 5\' 5"  (1.651 m).   Weight as of 04/09/22: 90.4 kg.  Lab Results  Component Value Date   ALBUMIN 2.7 (L) 04/14/2022   Diabetes: Patient does not have a diagnosis of diabetes.     Smoking Status:      Allergies:  Allergies  Allergen Reactions   Cefepime Rash   Ceftriaxone Rash    Codeine Nausea Only   Penicillin G Rash    Other reaction(s): rash    Medications: No current facility-administered medications for this encounter.   Current Outpatient Medications  Medication Sig Dispense Refill   amLODipine (NORVASC) 10 MG tablet Take 1 tablet (10 mg total) by mouth daily. 30 tablet 0   hydrocortisone cream 1 % Apply topically 3 (three) times daily. 30 g 0   levofloxacin (LEVAQUIN) 500 MG tablet Take 1 tablet (500 mg total) by mouth daily. 8 tablet 0   ondansetron (ZOFRAN) 4 MG tablet Take 1 tablet (4 mg total) by mouth every 6 (six) hours as needed for nausea. 20 tablet 0   oxyCODONE (OXY IR/ROXICODONE) 5 MG immediate release tablet Take 1 tablet (5 mg total) by mouth every 6 (six) hours as needed for moderate pain. 10 tablet 0   polyethylene glycol (MIRALAX / GLYCOLAX) 17 g packet Take 17 g by mouth daily as needed for mild constipation. 14 each 0   traMADol (ULTRAM) 50 MG tablet Take 50 mg by mouth every 6 (six) hours.      No results found for this or any previous visit (from the past 48 hour(s)). No results found.  ROS: Pain with rom of the right lower extremity  Physical Exam: Alert and oriented 77 y.o. female in no acute distress Cranial nerves 2-12 intact Cervical spine: full rom with no  tenderness, nv intact distally Chest: active breath sounds bilaterally, no wheeze rhonchi or rales Heart: regular rate and rhythm, no murmur Abd: non tender non distended with active bowel sounds Hip is stable with rom  Right knee painful rom with crepitus Nv intact distally No rashes or edema  Antalgic gait  Assessment/Plan Assessment: right knee end stage osteoarthritis  Plan:  Patient will undergo a right total knee by Dr. Ranell Patrick at [place] Risks benefits and expectations were discussed with the patient. Patient understand risks, benefits and expectations and wishes to proceed. Preoperative templating of the joint replacement has been completed, documented,  and submitted to the Operating Room personnel in order to optimize intra-operative equipment management.   Alphonsa Overall PA-C, MPAS [facility] is now [employer] [address], [place], [place] [address] Phone: [phone] [email or web address] Facebook  [employer]

## 2023-03-30 ENCOUNTER — Other Ambulatory Visit: Payer: Self-pay

## 2023-03-30 ENCOUNTER — Encounter (HOSPITAL_COMMUNITY)
Admission: RE | Admit: 2023-03-30 | Discharge: 2023-03-30 | Disposition: A | Discharge status: Home / Self Care | Payer: Medicare HMO | Source: Ambulatory Visit | Attending: Orthopedic Surgery | Admitting: Orthopedic Surgery

## 2023-03-30 ENCOUNTER — Encounter (HOSPITAL_COMMUNITY): Payer: Self-pay

## 2023-03-30 DIAGNOSIS — Z01812 Encounter for preprocedural laboratory examination: Secondary | ICD-10-CM | POA: Diagnosis present

## 2023-03-30 DIAGNOSIS — I10 Essential (primary) hypertension: Secondary | ICD-10-CM | POA: Diagnosis not present

## 2023-03-30 DIAGNOSIS — Z01818 Encounter for other preprocedural examination: Secondary | ICD-10-CM

## 2023-03-30 HISTORY — DX: Personal history of urinary calculi: Z87.442

## 2023-03-30 HISTORY — DX: Unspecified osteoarthritis, unspecified site: M19.90

## 2023-03-30 LAB — CBC
HCT: 41.6 % (ref 36.0–46.0)
Hemoglobin: 13.8 g/dL (ref 12.0–15.0)
MCH: 31 pg (ref 26.0–34.0)
MCHC: 33.2 g/dL (ref 30.0–36.0)
MCV: 93.5 fL (ref 80.0–100.0)
Platelets: 294 10*3/uL (ref 150–400)
RBC: 4.45 MIL/uL (ref 3.87–5.11)
RDW: 12.5 % (ref 11.5–15.5)
WBC: 6.4 10*3/uL (ref 4.0–10.5)
nRBC: 0 % (ref 0.0–0.2)

## 2023-03-30 LAB — BASIC METABOLIC PANEL
Anion gap: 9 (ref 5–15)
BUN: 11 mg/dL (ref 8–23)
CO2: 25 mmol/L (ref 22–32)
Calcium: 9.5 mg/dL (ref 8.9–10.3)
Chloride: 105 mmol/L (ref 98–111)
Creatinine, Ser: 0.73 mg/dL (ref 0.44–1.00)
GFR, Estimated: >60 mL/min (ref >60)
Glucose, Bld: 101 mg/dL — ABNORMAL HIGH (ref 70–99)
Potassium: 3.5 mmol/L (ref 3.5–5.1)
Sodium: 139 mmol/L (ref 135–145)

## 2023-03-30 LAB — SURGICAL PCR SCREEN
MRSA, PCR: NEGATIVE
Staphylococcus aureus: NEGATIVE

## 2023-03-30 NOTE — Progress Notes (Addendum)
Anesthesia note:   PCP - Dr. Ladora Daniel Cardiologist -no Other-   Chest x-ray - 04/07/22-epic EKG - 04/07/22-epic Stress Test - no ECHO - no Cardiac Cath - no CABG-no Pacemaker/ICD device last checked:NA  Sleep Study - no CPAP -   Pt is pre diabetic-no CBG at PAT visit- Fasting Blood Sugar at home- Checks Blood Sugar _____  Blood Thinner:no Blood Thinner Instructions: Aspirin Instructions: Last Dose:  Anesthesia review: no  Patient denies shortness of breath, fever, cough and chest pain at PAT appointment. Pt has no SOB with activities. Pt has an ear infection  a few weeks ago but is better now.   Patient verbalized understanding of instructions that were given to them at the PAT appointment. Patient was also instructed that they will need to review over the PAT instructions again at home before surgery.yes, grand daughter was with her

## 2023-04-07 ENCOUNTER — Ambulatory Visit (HOSPITAL_COMMUNITY): Payer: Medicare HMO | Admitting: Anesthesiology

## 2023-04-07 ENCOUNTER — Encounter (HOSPITAL_COMMUNITY): Payer: Self-pay | Admitting: Orthopedic Surgery

## 2023-04-07 ENCOUNTER — Encounter (HOSPITAL_COMMUNITY)
Admission: RE | Disposition: A | Discharge status: Home / Self Care | Payer: Self-pay | Source: Home / Self Care | Attending: Orthopedic Surgery

## 2023-04-07 ENCOUNTER — Ambulatory Visit (HOSPITAL_BASED_OUTPATIENT_CLINIC_OR_DEPARTMENT_OTHER): Payer: Medicare HMO | Admitting: Anesthesiology

## 2023-04-07 ENCOUNTER — Observation Stay (HOSPITAL_COMMUNITY)
Admission: RE | Admit: 2023-04-07 | Discharge: 2023-04-08 | Disposition: A | Discharge status: Home / Self Care | Payer: Medicare HMO | Attending: Orthopedic Surgery | Admitting: Orthopedic Surgery

## 2023-04-07 ENCOUNTER — Other Ambulatory Visit: Payer: Self-pay

## 2023-04-07 DIAGNOSIS — Z01818 Encounter for other preprocedural examination: Secondary | ICD-10-CM

## 2023-04-07 DIAGNOSIS — I10 Essential (primary) hypertension: Secondary | ICD-10-CM | POA: Diagnosis not present

## 2023-04-07 DIAGNOSIS — M1711 Unilateral primary osteoarthritis, right knee: Principal | ICD-10-CM | POA: Insufficient documentation

## 2023-04-07 DIAGNOSIS — Z87891 Personal history of nicotine dependence: Secondary | ICD-10-CM | POA: Insufficient documentation

## 2023-04-07 DIAGNOSIS — Z79899 Other long term (current) drug therapy: Secondary | ICD-10-CM | POA: Insufficient documentation

## 2023-04-07 DIAGNOSIS — E039 Hypothyroidism, unspecified: Secondary | ICD-10-CM

## 2023-04-07 DIAGNOSIS — Z96651 Presence of right artificial knee joint: Secondary | ICD-10-CM

## 2023-04-07 HISTORY — PX: TOTAL KNEE ARTHROPLASTY: SHX125

## 2023-04-07 SURGERY — ARTHROPLASTY, KNEE, TOTAL
Anesthesia: Spinal | Site: Knee | Laterality: Right

## 2023-04-07 MED ORDER — ASPIRIN 81 MG PO CHEW
81.0000 mg | CHEWABLE_TABLET | Freq: Two times a day (BID) | ORAL | Status: DC
  Administered 2023-04-07 – 2023-04-08 (×2): 81 mg via ORAL
  Filled 2023-04-07 (×2): qty 1

## 2023-04-07 MED ORDER — DEXAMETHASONE SODIUM PHOSPHATE 10 MG/ML IJ SOLN
INTRAMUSCULAR | Status: AC
  Filled 2023-04-07: qty 1

## 2023-04-07 MED ORDER — HYDROCORTISONE 1 % EX CREA: TOPICAL_CREAM | Freq: Three times a day (TID) | CUTANEOUS | Status: DC

## 2023-04-07 MED ORDER — LACTATED RINGERS IV SOLN: INTRAVENOUS | Status: DC

## 2023-04-07 MED ORDER — MENTHOL 3 MG MT LOZG: 1.0000 | LOZENGE | OROMUCOSAL | Status: DC | PRN

## 2023-04-07 MED ORDER — POLYETHYLENE GLYCOL 3350 17 G PO PACK: 17.0000 g | PACK | Freq: Every day | ORAL | Status: DC | PRN

## 2023-04-07 MED ORDER — BUPIVACAINE LIPOSOME 1.3 % IJ SUSP
INTRAMUSCULAR | Status: AC
  Filled 2023-04-07: qty 20

## 2023-04-07 MED ORDER — METOCLOPRAMIDE HCL 5 MG/ML IJ SOLN: 5.0000 mg | Freq: Three times a day (TID) | INTRAMUSCULAR | Status: DC | PRN

## 2023-04-07 MED ORDER — ACETAMINOPHEN 325 MG PO TABS
325.0000 mg | ORAL_TABLET | Freq: Four times a day (QID) | ORAL | Status: DC | PRN
  Administered 2023-04-07 – 2023-04-08 (×4): 650 mg via ORAL
  Filled 2023-04-07 (×4): qty 2

## 2023-04-07 MED ORDER — SODIUM CHLORIDE 0.9 % IV SOLN: INTRAVENOUS | Status: DC

## 2023-04-07 MED ORDER — PHENOL 1.4 % MT LIQD: 1.0000 | OROMUCOSAL | Status: DC | PRN

## 2023-04-07 MED ORDER — PROPOFOL 10 MG/ML IV BOLUS
INTRAVENOUS | Status: DC | PRN
  Administered 2023-04-07: 75 ug/kg/min via INTRAVENOUS

## 2023-04-07 MED ORDER — FENTANYL CITRATE PF 50 MCG/ML IJ SOSY
100.0000 ug | PREFILLED_SYRINGE | Freq: Once | INTRAMUSCULAR | Status: AC
  Administered 2023-04-07: 50 ug via INTRAVENOUS
  Filled 2023-04-07: qty 2

## 2023-04-07 MED ORDER — BISACODYL 10 MG RE SUPP: 10.0000 mg | Freq: Every day | RECTAL | Status: DC | PRN

## 2023-04-07 MED ORDER — SODIUM CHLORIDE 0.9 % IR SOLN
Status: DC | PRN
  Administered 2023-04-07: 1000 mL

## 2023-04-07 MED ORDER — OXYCODONE HCL 5 MG PO TABS
5.0000 mg | ORAL_TABLET | ORAL | Status: DC | PRN
  Filled 2023-04-07: qty 2

## 2023-04-07 MED ORDER — ONDANSETRON HCL 4 MG/2ML IJ SOLN
INTRAMUSCULAR | Status: DC | PRN
  Administered 2023-04-07 (×2): 4 mg via INTRAVENOUS

## 2023-04-07 MED ORDER — ONDANSETRON HCL 4 MG/2ML IJ SOLN
INTRAMUSCULAR | Status: AC
  Filled 2023-04-07: qty 2

## 2023-04-07 MED ORDER — 0.9 % SODIUM CHLORIDE (POUR BTL) OPTIME
TOPICAL | Status: DC | PRN
  Administered 2023-04-07: 1000 mL

## 2023-04-07 MED ORDER — CHLORHEXIDINE GLUCONATE 0.12 % MT SOLN
15.0000 mL | Freq: Once | OROMUCOSAL | Status: AC
  Administered 2023-04-07: 15 mL via OROMUCOSAL

## 2023-04-07 MED ORDER — GLUCOSAMINE HCL 500 MG PO TABS: 500.0000 mg | ORAL_TABLET | Freq: Every day | ORAL | Status: DC

## 2023-04-07 MED ORDER — DOCUSATE SODIUM 100 MG PO CAPS
100.0000 mg | ORAL_CAPSULE | Freq: Two times a day (BID) | ORAL | Status: DC
  Administered 2023-04-07 – 2023-04-08 (×2): 100 mg via ORAL
  Filled 2023-04-07 (×2): qty 1

## 2023-04-07 MED ORDER — ROCURONIUM BROMIDE 10 MG/ML (PF) SYRINGE
PREFILLED_SYRINGE | INTRAVENOUS | Status: AC
  Filled 2023-04-07: qty 10

## 2023-04-07 MED ORDER — TRANEXAMIC ACID-NACL 1000-0.7 MG/100ML-% IV SOLN
1000.0000 mg | Freq: Once | INTRAVENOUS | Status: AC
  Administered 2023-04-07: 1000 mg via INTRAVENOUS
  Filled 2023-04-07: qty 100

## 2023-04-07 MED ORDER — CHOLECALCIFEROL 10 MCG (400 UNIT) PO TABS
400.0000 [IU] | ORAL_TABLET | Freq: Every day | ORAL | Status: DC
  Administered 2023-04-08: 400 [IU] via ORAL
  Filled 2023-04-07: qty 1

## 2023-04-07 MED ORDER — PROBIOTIC ACIDOPHILUS PO CAPS: 1.0000 | ORAL_CAPSULE | Freq: Every day | ORAL | Status: DC

## 2023-04-07 MED ORDER — ORAL CARE MOUTH RINSE: 15.0000 mL | Freq: Once | OROMUCOSAL | Status: AC

## 2023-04-07 MED ORDER — VANCOMYCIN HCL IN DEXTROSE 1-5 GM/200ML-% IV SOLN
1000.0000 mg | INTRAVENOUS | Status: AC
  Administered 2023-04-07: 1000 mg via INTRAVENOUS
  Filled 2023-04-07: qty 200

## 2023-04-07 MED ORDER — METHOCARBAMOL 500 MG IVPB - SIMPLE MED: 500.0000 mg | Freq: Four times a day (QID) | INTRAVENOUS | Status: DC | PRN

## 2023-04-07 MED ORDER — METHOCARBAMOL 500 MG PO TABS
ORAL_TABLET | ORAL | Status: AC
  Filled 2023-04-07: qty 1

## 2023-04-07 MED ORDER — ROPIVACAINE HCL 7.5 MG/ML IJ SOLN
INTRAMUSCULAR | Status: DC | PRN
  Administered 2023-04-07: 20 mL via PERINEURAL

## 2023-04-07 MED ORDER — SODIUM CHLORIDE (PF) 0.9 % IJ SOLN
INTRAMUSCULAR | Status: AC
  Filled 2023-04-07: qty 50

## 2023-04-07 MED ORDER — SODIUM CHLORIDE (PF) 0.9 % IJ SOLN
INTRAMUSCULAR | Status: DC | PRN
  Administered 2023-04-07: 30 mL

## 2023-04-07 MED ORDER — ONDANSETRON HCL 4 MG PO TABS: 4.0000 mg | ORAL_TABLET | Freq: Four times a day (QID) | ORAL | Status: DC | PRN

## 2023-04-07 MED ORDER — ONDANSETRON HCL 4 MG/2ML IJ SOLN: 4.0000 mg | Freq: Four times a day (QID) | INTRAMUSCULAR | Status: DC | PRN

## 2023-04-07 MED ORDER — BUPIVACAINE IN DEXTROSE 0.75-8.25 % IT SOLN
INTRATHECAL | Status: DC | PRN
  Administered 2023-04-07: 1.6 mL via INTRATHECAL

## 2023-04-07 MED ORDER — AMLODIPINE BESYLATE 10 MG PO TABS: 10.0000 mg | ORAL_TABLET | Freq: Every day | ORAL | Status: DC

## 2023-04-07 MED ORDER — VANCOMYCIN HCL IN DEXTROSE 1-5 GM/200ML-% IV SOLN
1000.0000 mg | Freq: Two times a day (BID) | INTRAVENOUS | Status: AC
  Administered 2023-04-07: 1000 mg via INTRAVENOUS
  Filled 2023-04-07: qty 200

## 2023-04-07 MED ORDER — PHENYLEPHRINE HCL-NACL 20-0.9 MG/250ML-% IV SOLN
INTRAVENOUS | Status: DC | PRN
  Administered 2023-04-07: 40 ug/min via INTRAVENOUS

## 2023-04-07 MED ORDER — BUPIVACAINE LIPOSOME 1.3 % IJ SUSP
INTRAMUSCULAR | Status: DC | PRN
  Administered 2023-04-07: 20 mL

## 2023-04-07 MED ORDER — METHOCARBAMOL 500 MG PO TABS
500.0000 mg | ORAL_TABLET | Freq: Four times a day (QID) | ORAL | Status: DC | PRN
  Administered 2023-04-07 – 2023-04-08 (×4): 500 mg via ORAL
  Filled 2023-04-07 (×3): qty 1

## 2023-04-07 MED ORDER — RISAQUAD PO CAPS
1.0000 | ORAL_CAPSULE | Freq: Every day | ORAL | Status: DC
  Administered 2023-04-08: 1 via ORAL
  Filled 2023-04-07: qty 1

## 2023-04-07 MED ORDER — EPINEPHRINE PF 1 MG/ML IJ SOLN
INTRAMUSCULAR | Status: AC
  Filled 2023-04-07: qty 1

## 2023-04-07 MED ORDER — LIDOCAINE HCL (CARDIAC) PF 100 MG/5ML IV SOSY
PREFILLED_SYRINGE | INTRAVENOUS | Status: DC | PRN
  Administered 2023-04-07: 50 mg via INTRAVENOUS

## 2023-04-07 MED ORDER — BUPIVACAINE LIPOSOME 1.3 % IJ SUSP: 20.0000 mL | Freq: Once | INTRAMUSCULAR | Status: DC

## 2023-04-07 MED ORDER — BENZONATATE 100 MG PO CAPS
200.0000 mg | ORAL_CAPSULE | Freq: Every day | ORAL | Status: DC
  Administered 2023-04-08: 200 mg via ORAL
  Filled 2023-04-07: qty 2

## 2023-04-07 MED ORDER — ONDANSETRON HCL 4 MG PO TABS: 4.0000 mg | ORAL_TABLET | Freq: Three times a day (TID) | ORAL | 1 refills | Status: AC | PRN

## 2023-04-07 MED ORDER — FENTANYL CITRATE PF 50 MCG/ML IJ SOSY: 25.0000 ug | PREFILLED_SYRINGE | INTRAMUSCULAR | Status: DC | PRN

## 2023-04-07 MED ORDER — BUPIVACAINE-EPINEPHRINE 0.25% -1:200000 IJ SOLN
INTRAMUSCULAR | Status: DC | PRN
  Administered 2023-04-07: 30 mL

## 2023-04-07 MED ORDER — AZELASTINE HCL 0.1 % NA SOLN
1.0000 | Freq: Two times a day (BID) | NASAL | Status: DC
  Administered 2023-04-07 – 2023-04-08 (×2): 1 via NASAL
  Filled 2023-04-07: qty 30

## 2023-04-07 MED ORDER — POVIDONE-IODINE 10 % EX SWAB: 2.0000 | Freq: Once | CUTANEOUS | Status: DC

## 2023-04-07 MED ORDER — TRIAMCINOLONE ACETONIDE 0.1 % EX CREA: 1.0000 | TOPICAL_CREAM | Freq: Two times a day (BID) | CUTANEOUS | Status: DC | PRN

## 2023-04-07 MED ORDER — TRANEXAMIC ACID-NACL 1000-0.7 MG/100ML-% IV SOLN
1000.0000 mg | INTRAVENOUS | Status: AC
  Administered 2023-04-07: 1000 mg via INTRAVENOUS
  Filled 2023-04-07: qty 100

## 2023-04-07 MED ORDER — ONDANSETRON HCL 4 MG/2ML IJ SOLN: 4.0000 mg | Freq: Once | INTRAMUSCULAR | Status: DC | PRN

## 2023-04-07 MED ORDER — OXYCODONE HCL 5 MG PO TABS: 5.0000 mg | ORAL_TABLET | Freq: Four times a day (QID) | ORAL | 0 refills | Status: DC | PRN

## 2023-04-07 MED ORDER — LEVOFLOXACIN 500 MG PO TABS: 500.0000 mg | ORAL_TABLET | Freq: Every day | ORAL | Status: DC

## 2023-04-07 MED ORDER — ONDANSETRON HCL 4 MG PO TABS
4.0000 mg | ORAL_TABLET | Freq: Four times a day (QID) | ORAL | Status: DC | PRN
  Filled 2023-04-07: qty 1

## 2023-04-07 MED ORDER — METHOCARBAMOL 500 MG PO TABS: 500.0000 mg | ORAL_TABLET | Freq: Three times a day (TID) | ORAL | 1 refills | Status: DC | PRN

## 2023-04-07 MED ORDER — WATER FOR IRRIGATION, STERILE IR SOLN
Status: DC | PRN
  Administered 2023-04-07: 2000 mL

## 2023-04-07 MED ORDER — LISINOPRIL 10 MG PO TABS
10.0000 mg | ORAL_TABLET | Freq: Every day | ORAL | Status: DC
  Administered 2023-04-08: 10 mg via ORAL
  Filled 2023-04-07: qty 1

## 2023-04-07 MED ORDER — BUPIVACAINE HCL 0.25 % IJ SOLN
INTRAMUSCULAR | Status: AC
  Filled 2023-04-07: qty 1

## 2023-04-07 MED ORDER — HYDROMORPHONE HCL 1 MG/ML IJ SOLN: 0.5000 mg | INTRAMUSCULAR | Status: DC | PRN

## 2023-04-07 MED ORDER — METOCLOPRAMIDE HCL 5 MG PO TABS: 5.0000 mg | ORAL_TABLET | Freq: Three times a day (TID) | ORAL | Status: DC | PRN

## 2023-04-07 MED ORDER — DEXMEDETOMIDINE HCL IN NACL 80 MCG/20ML IV SOLN
INTRAVENOUS | Status: AC
  Filled 2023-04-07: qty 20

## 2023-04-07 MED ORDER — PHENYLEPHRINE HCL-NACL 20-0.9 MG/250ML-% IV SOLN
INTRAVENOUS | Status: AC
  Filled 2023-04-07: qty 250

## 2023-04-07 MED ORDER — ACETAMINOPHEN 325 MG PO TABS: 325.0000 mg | ORAL_TABLET | Freq: Four times a day (QID) | ORAL | Status: DC | PRN

## 2023-04-07 MED ORDER — LEVOFLOXACIN IN D5W 500 MG/100ML IV SOLN
500.0000 mg | INTRAVENOUS | Status: AC
  Administered 2023-04-07: 500 mg via INTRAVENOUS
  Filled 2023-04-07: qty 100

## 2023-04-07 MED ORDER — LACTATED RINGERS IV SOLN: INTRAVENOUS | Status: DC | PRN

## 2023-04-07 MED ORDER — PROPOFOL 10 MG/ML IV BOLUS
INTRAVENOUS | Status: AC
  Filled 2023-04-07: qty 20

## 2023-04-07 MED ORDER — ACETAMINOPHEN 500 MG PO TABS
1000.0000 mg | ORAL_TABLET | Freq: Once | ORAL | Status: AC
  Administered 2023-04-07: 1000 mg via ORAL
  Filled 2023-04-07: qty 2

## 2023-04-07 SURGICAL SUPPLY — 50 items
ATTUNE MED DOME PAT 38 KNEE (Knees) IMPLANT
ATTUNE PS FEM RT SZ 7 CEM KNEE (Femur) IMPLANT
ATTUNE PSRP INSR SZ7 10 KNEE (Insert) IMPLANT
BASE TIBIAL ROT PLAT SZ 7 KNEE (Knees) IMPLANT
BLADE SAG 18X100X1.27 (BLADE) ×1 IMPLANT
BLADE SAW SGTL 13X75X1.27 (BLADE) ×1 IMPLANT
BNDG CMPR MED 10X6 ELC LF (GAUZE/BANDAGES/DRESSINGS) ×1
BNDG ELASTIC 6X10 VLCR STRL LF (GAUZE/BANDAGES/DRESSINGS) ×1 IMPLANT
BNDG GAUZE DERMACEA FLUFF 4 (GAUZE/BANDAGES/DRESSINGS) ×1 IMPLANT
BNDG GZE DERMACEA 4 6PLY (GAUZE/BANDAGES/DRESSINGS) ×1
BOWL SMART MIX CTS (DISPOSABLE) ×1 IMPLANT
BSPLAT TIB 7 CMNT ROT PLAT STR (Knees) ×1 IMPLANT
CEMENT HV SMART SET (Cement) ×2 IMPLANT
COVER SURGICAL LIGHT HANDLE (MISCELLANEOUS) ×1 IMPLANT
CUFF TOURN SGL QUICK 34 (TOURNIQUET CUFF) ×1
CUFF TRNQT CYL 34X4.125X (TOURNIQUET CUFF) ×1 IMPLANT
DRAPE INCISE IOBAN 66X45 STRL (DRAPES) ×1 IMPLANT
DRAPE SHEET LG 3/4 BI-LAMINATE (DRAPES) ×1 IMPLANT
DRAPE U-SHAPE 47X51 STRL (DRAPES) ×1 IMPLANT
DRSG ADAPTIC 3X8 NADH LF (GAUZE/BANDAGES/DRESSINGS) ×1 IMPLANT
DURAPREP 26ML APPLICATOR (WOUND CARE) ×1 IMPLANT
ELECT REM PT RETURN 15FT ADLT (MISCELLANEOUS) ×1 IMPLANT
GAUZE PAD ABD 8X10 STRL (GAUZE/BANDAGES/DRESSINGS) ×1 IMPLANT
GAUZE SPONGE 4X4 12PLY STRL (GAUZE/BANDAGES/DRESSINGS) ×1 IMPLANT
GLOVE BIOGEL PI IND STRL 7.5 (GLOVE) ×1 IMPLANT
GLOVE BIOGEL PI IND STRL 8.5 (GLOVE) ×1 IMPLANT
GLOVE ORTHO TXT STRL SZ7.5 (GLOVE) ×1 IMPLANT
GLOVE SURG ORTHO 8.5 STRL (GLOVE) ×2 IMPLANT
GOWN STRL REUS W/ TWL XL LVL3 (GOWN DISPOSABLE) ×2 IMPLANT
GOWN STRL REUS W/TWL XL LVL3 (GOWN DISPOSABLE) ×2
HANDPIECE INTERPULSE COAX TIP (DISPOSABLE) ×1
IMMOBILIZER KNEE 20 (SOFTGOODS) ×1
IMMOBILIZER KNEE 20 THIGH 36 (SOFTGOODS) IMPLANT
KIT TURNOVER KIT A (KITS) IMPLANT
MANIFOLD NEPTUNE II (INSTRUMENTS) ×1 IMPLANT
NS IRRIG 1000ML POUR BTL (IV SOLUTION) ×1 IMPLANT
PACK TOTAL KNEE CUSTOM (KITS) ×1 IMPLANT
PROTECTOR NERVE ULNAR (MISCELLANEOUS) ×1 IMPLANT
SET HNDPC FAN SPRY TIP SCT (DISPOSABLE) ×1 IMPLANT
STAPLER VISISTAT 35W (STAPLE) IMPLANT
STRIP CLOSURE SKIN 1/2X4 (GAUZE/BANDAGES/DRESSINGS) ×2 IMPLANT
SUT MNCRL AB 3-0 PS2 18 (SUTURE) ×1 IMPLANT
SUT VIC AB 0 CT1 36 (SUTURE) ×1 IMPLANT
SUT VIC AB 1 CT1 36 (SUTURE) ×2 IMPLANT
SUT VIC AB 2-0 CT1 27 (SUTURE) ×1
SUT VIC AB 2-0 CT1 TAPERPNT 27 (SUTURE) ×1 IMPLANT
TIBIAL BASE ROT PLAT SZ 7 KNEE (Knees) ×1 IMPLANT
TRAY CATH INTERMITTENT SS 16FR (CATHETERS) ×1 IMPLANT
WATER STERILE IRR 1000ML POUR (IV SOLUTION) ×2 IMPLANT
YANKAUER SUCT BULB TIP NO VENT (SUCTIONS) ×1 IMPLANT

## 2023-04-07 NOTE — Progress Notes (Signed)
Orthopedic Tech Progress Note Patient Details:  Jenny Krueger [birth year] [id number] CPM will be removed at 8:30pm.  CPM Right Knee CPM Right Knee: On Right Knee Flexion (Degrees): 90 Right Knee Extension (Degrees): 0  Post Interventions Patient Tolerated: Well Ortho Devices Type of Ortho Device: Bone foam zero knee Ortho Device/Splint Location: Right knee Ortho Device/Splint Interventions: Application   Post Interventions Patient Tolerated: Well  [name] 04/07/2023, 4:42 PM

## 2023-04-07 NOTE — Op Note (Unsigned)
NAMELAMONDA, YAMAMURA MEDICAL RECORD NO: [id number] ACCOUNT NO: [id number] DATE OF BIRTH: 11/11/1945 FACILITY: [facility] LOCATION: [facility] PHYSICIAN: Almedia Balls. Ranell Patrick, MD  Operative Report   DATE OF PROCEDURE: 04/07/2023  PREOPERATIVE DIAGNOSIS:  Right knee end-stage arthritis.  POSTOPERATIVE DIAGNOSIS:  Right knee end-stage arthritis.  PROCEDURE PERFORMED:  Right total knee arthroplasty using DePuy Attune prosthesis.  ATTENDING SURGEON:  Almedia Balls. Ranell Patrick, MD  ASSISTANT:  Konrad Felix Dixon, [place], who was scrubbed during the entire procedure, and necessary for satisfactory completion of surgery.  ANESTHESIA:  Spinal anesthesia was used plus adductor canal block.  ESTIMATED BLOOD LOSS:  Minimal.  FLUID REPLACEMENT:  1500 mL crystalloid.  COUNTS:  Instrument counts correct.  COMPLICATIONS:  No complications.  ANTIBIOTICS:  Perioperative antibiotics were given.  TOURNIQUET TIME:  89 minutes at 325 mmHg.  INDICATIONS:  The patient is a 77 year old female who presents with worsening right knee pain secondary to bone-on-bone arthritis.  The patient has had progressive pain despite conservative management for years and now presents for operative treatment to  eliminate pain and restore function.  Informed consent obtained.  DESCRIPTION OF PROCEDURE:  After an adequate level of anesthesia was achieved, the patient was positioned supine on the operating room table.  Right leg correctly identified.  Nonsterile tourniquet was placed on the proximal thigh.  Right leg sterilely  prepped and draped in the usual manner.  Timeout called, verifying correct patient, correct site.  We elevated the leg and exsanguinated with an Esmarch bandage, inflating the tourniquet to 325 mmHg.  We then placed the knee in flexion and performed a  longitudinal midline incision with a 10 blade scalpel.  A fresh 10 blade was used for medial parapatellar arthrotomy.  We then divided lateral patellofemoral ligaments  everting the patella and exposing the distal femur, which was devoid of cartilage.   Large osteophytes were noted and removed.  We then entered the distal femur with a step cut drill.  We placed our intramedullary guide and resected 11 mm off the distal femur set on 5 degrees of valgus due to flexion contracture.  We then sized our femur  to a size 7 anterior down performing anterior, posterior and chamfer cuts with the 4-in-1 block.  We then removed ACL and PCL meniscal tissues subluxing the tibia anteriorly.  We then performed our tibial cut 90 degrees perpendicular to the long axis of  tibia with minimal posterior slope for this posterior cruciate substituting prosthesis.  We resected 2 mm off the affected medial side.  We then placed a lamina spreader, removing posterior femoral condyle osteophytes. Multiple large loose bodies were  removed from the posterior aspect of the knee.  A large fabella was left alone.  We went ahead and used a curved osteotome and removed posterior femoral condyle osteophytes.  We injected the posterior capsule with a combination of Marcaine, Exparel and  saline.  At this point, we checked our gaps, which were symmetric at 6 mm.  We then removed our tibial pins and completed our tibial preparation with modular drill and keel punch for the 7 tibia.  This gave good bony coverage all the way out to the  cortical margins.  Next, we went to the femur and cut the box for the 7 right femur.  We went ahead and placed the trial in place and drilled our lug holes.  We then reduced initially with a 6 mm poly.  We then went with the 8 mm poly and had excellent  flexion and extension stability.  We placed the knee in extension.  I was then able to resurface the patella, which was quite deformed, but we were able to get down to starting about 25 mm thickness down to 17 mm thickness with the oscillating saw using  the patellar cutting guide.  I drilled lug holes for the 38 patellar button.   We placed the trial button in place and ranged the knee and had excellent patellar tracking with no-touch technique.  We then removed the trial components.  We irrigated  thoroughly.  We dried the bone well and vacuum mixed high viscosity cement and cemented the components into place with a 7 tibia, 7 right femur and we placed an 8 mm poly and held the knee in extension while the cement set up with good compression.  We  also had a patellar clamp holding the patella and compressed until the cement set up.  Once all cement was hardened on the back table, we removed excess cement with quarter-inch curved osteotome.  We inspected the entirety of the knee including  posteriorly.  Next, we selected the real size 7 10 mm poly placed it on the tibial tray and reduced the knee.  Nice little pop as the medial condyle reduced.  We had excellent flexion and extension stability with good full extension and excellent  patellar tracking.  We irrigated again.  We then went ahead and injected the anterior capsule with combination of Marcaine, Exparel and saline and closed the parapatellar arthrotomy with #1 Vicryl suture, followed by 2-0 Vicryl for subcutaneous closure  and 4-0 Monocryl for skin.  Steri-Strips and a sterile dressing applied.  The patient was taken to recovery room in stable condition.   PUS D: 04/07/2023 4:03:01 pm T: 04/07/2023 4:22:00 pm  JOB: [id number]/ [id number]

## 2023-04-07 NOTE — Transfer of Care (Signed)
Immediate Anesthesia Transfer of Care Note  Patient: KEYARA BILLS  Procedure(s) Performed: TOTAL KNEE ARTHROPLASTY (Right: Knee)  Patient Location: PACU  Anesthesia Type:MAC combined with regional for post-op pain  Level of Consciousness: awake and alert   Airway & Oxygen Therapy: Patient Spontanous Breathing  Post-op Assessment: Report given to RN and Post -op Vital signs reviewed and stable  Post vital signs: Reviewed and stable  Last Vitals:  Vitals Value Taken Time  BP 133/68 04/07/23 1600  Temp    Pulse 87 04/07/23 1601  Resp 14 04/07/23 1601  SpO2 96 % 04/07/23 1601  Vitals shown include unvalidated device data.  Last Pain:  Vitals:   04/07/23 1245  TempSrc:   PainSc: 0-No pain         Complications: No notable events documented.

## 2023-04-07 NOTE — Progress Notes (Signed)
Patient stated that she could be here around 10 am

## 2023-04-07 NOTE — Discharge Instructions (Signed)
Ice to the knee constantly.  Keep the incision covered and clean and dry for one week, then ok to get it wet in the shower.  Do exercise as instructed every hour, please to prevent stiffness.    DO NOT prop anything under the knee, it will make your knee stiff.  Prop under the ankle to encourage your knee to go straight.   Use the walker while you are up and around for balance.  Wear your support stockings 24/7 to prevent blood clots and take baby aspirin twice daily for 30 days also to prevent blood clots  Follow up with Dr Ranell Patrick in two weeks in the office, call [phone] for appt  Please call Dr Ranell Patrick (cell) at [phone] with any questions or concerns    INSTRUCTIONS AFTER JOINT REPLACEMENT   Remove items at home which could result in a fall. This includes throw rugs or furniture in walking pathways ICE to the affected joint every three hours while awake for 30 minutes at a time, for at least the first 3-5 days, and then as needed for pain and swelling.  Continue to use ice for pain and swelling. You may notice swelling that will progress down to the foot and ankle.  This is normal after surgery.  Elevate your leg when you are not up walking on it.   Continue to use the breathing machine you got in the hospital (incentive spirometer) which will help keep your temperature down.  It is common for your temperature to cycle up and down following surgery, especially at night when you are not up moving around and exerting yourself.  The breathing machine keeps your lungs expanded and your temperature down.   DIET:  As you were doing prior to hospitalization, we recommend a well-balanced diet.  DRESSING / WOUND CARE / SHOWERING  You may change your dressing 3-5 days after surgery.  Then change the dressing every day with sterile gauze.  Please use good hand washing techniques before changing the dressing.  Do not use any lotions or creams on the incision until instructed by your  surgeon.  ACTIVITY  Increase activity slowly as tolerated, but follow the weight bearing instructions below.   No driving for 6 weeks or until further direction given by your physician.  You cannot drive while taking narcotics.  No lifting or carrying greater than 10 lbs. until further directed by your surgeon. Avoid periods of inactivity such as sitting longer than an hour when not asleep. This helps prevent blood clots.  You may return to work once you are authorized by your doctor.     WEIGHT BEARING   Weight bearing as tolerated with assist device (walker, cane, etc) as directed, use it as long as suggested by your surgeon or therapist, typically at least 4-6 weeks.   EXERCISES  Results after joint replacement surgery are often greatly improved when you follow the exercise, range of motion and muscle strengthening exercises prescribed by your doctor. Safety measures are also important to protect the joint from further injury. Any time any of these exercises cause you to have increased pain or swelling, decrease what you are doing until you are comfortable again and then slowly increase them. If you have problems or questions, call your caregiver or physical therapist for advice.   Rehabilitation is important following a joint replacement. After just a few days of immobilization, the muscles of the leg can become weakened and shrink (atrophy).  These exercises are designed  to build up the tone and strength of the thigh and leg muscles and to improve motion. Often times heat used for twenty to thirty minutes before working out will loosen up your tissues and help with improving the range of motion but do not use heat for the first two weeks following surgery (sometimes heat can increase post-operative swelling).   These exercises can be done on a training (exercise) mat, on the floor, on a table or on a bed. Use whatever works the best and is most comfortable for you.    Use music or  television while you are exercising so that the exercises are a pleasant break in your day. This will make your life better with the exercises acting as a break in your routine that you can look forward to.   Perform all exercises about fifteen times, three times per day or as directed.  You should exercise both the operative leg and the other leg as well.  Exercises include:   Quad Sets - Tighten up the muscle on the front of the thigh (Quad) and hold for 5-10 seconds.   Straight Leg Raises - With your knee straight (if you were given a brace, keep it on), lift the leg to 60 degrees, hold for 3 seconds, and slowly lower the leg.  Perform this exercise against resistance later as your leg gets stronger.  Leg Slides: Lying on your back, slowly slide your foot toward your buttocks, bending your knee up off the floor (only go as far as is comfortable). Then slowly slide your foot back down until your leg is flat on the floor again.  Angel Wings: Lying on your back spread your legs to the side as far apart as you can without causing discomfort.  Hamstring Strength:  Lying on your back, push your heel against the floor with your leg straight by tightening up the muscles of your buttocks.  Repeat, but this time bend your knee to a comfortable angle, and push your heel against the floor.  You may put a pillow under the heel to make it more comfortable if necessary.   A rehabilitation program following joint replacement surgery can speed recovery and prevent re-injury in the future due to weakened muscles. Contact your doctor or a physical therapist for more information on knee rehabilitation.    CONSTIPATION  Constipation is defined medically as fewer than three stools per week and severe constipation as less than one stool per week.  Even if you have a regular bowel pattern at home, your normal regimen is likely to be disrupted due to multiple reasons following surgery.  Combination of anesthesia,  postoperative narcotics, change in appetite and fluid intake all can affect your bowels.   YOU MUST use at least one of the following options; they are listed in order of increasing strength to get the job done.  They are all available over the counter, and you may need to use some, POSSIBLY even all of these options:    Drink plenty of fluids (prune juice may be helpful) and high fiber foods Colace 100 mg by mouth twice a day  Senokot for constipation as directed and as needed Dulcolax (bisacodyl), take with full glass of water  Miralax (polyethylene glycol) once or twice a day as needed.  If you have tried all these things and are unable to have a bowel movement in the first 3-4 days after surgery call either your surgeon or your primary doctor.  If you experience loose stools or diarrhea, hold the medications until you stool forms back up.  If your symptoms do not get better within 1 week or if they get worse, check with your doctor.  If you experience "the worst abdominal pain ever" or develop nausea or vomiting, please contact the office immediately for further recommendations for treatment.   ITCHING:  If you experience itching with your medications, try taking only a single pain pill, or even half a pain pill at a time.  You can also use Benadryl over the counter for itching or also to help with sleep.   TED HOSE STOCKINGS:  Use stockings on both legs until for at least 2 weeks or as directed by physician office. They may be removed at night for sleeping.  MEDICATIONS:  See your medication summary on the "After Visit Summary" that nursing will review with you.  You may have some home medications which will be placed on hold until you complete the course of blood thinner medication.  It is important for you to complete the blood thinner medication as prescribed.  PRECAUTIONS:  If you experience chest pain or shortness of breath - call 911 immediately for transfer to the hospital emergency  department.   If you develop a fever greater that 101 F, purulent drainage from wound, increased redness or drainage from wound, foul odor from the wound/dressing, or calf pain - CONTACT YOUR SURGEON.                                                   FOLLOW-UP APPOINTMENTS:  If you do not already have a post-op appointment, please call the office for an appointment to be seen by your surgeon.  Guidelines for how soon to be seen are listed in your "After Visit Summary", but are typically between 1-4 weeks after surgery.  OTHER INSTRUCTIONS:   Knee Replacement:  Do not place pillow under knee, focus on keeping the knee straight while resting. CPM instructions: 0-90 degrees, 2 hours in the morning, 2 hours in the afternoon, and 2 hours in the evening. Place foam block, curve side up under heel at all times except when in CPM or when walking.  DO NOT modify, tear, cut, or change the foam block in any way.  POST-OPERATIVE OPIOID TAPER INSTRUCTIONS: It is important to wean off of your opioid medication as soon as possible. If you do not need pain medication after your surgery it is ok to stop day one. Opioids include: Codeine, Hydrocodone(Norco, Vicodin), Oxycodone(Percocet, oxycontin) and hydromorphone amongst others.  Long term and even short term use of opiods can cause: Increased pain response Dependence Constipation Depression Respiratory depression And more.  Withdrawal symptoms can include Flu like symptoms Nausea, vomiting And more Techniques to manage these symptoms Hydrate well Eat regular healthy meals Stay active Use relaxation techniques(deep breathing, meditating, yoga) Do Not substitute Alcohol to help with tapering If you have been on opioids for less than two weeks and do not have pain than it is ok to stop all together.  Plan to wean off of opioids This plan should start within one week post op of your joint replacement. Maintain the same interval or time between taking  each dose and first decrease the dose.  Cut the total daily intake of opioids by one tablet each day Next  start to increase the time between doses. The last dose that should be eliminated is the evening dose.   MAKE SURE YOU:  Understand these instructions.  Get help right away if you are not doing well or get worse.    Thank you for letting us be a part of your medical care team.  It is a privilege we respect greatly.  We hope these instructions will help you stay on track for a fast and full recovery!

## 2023-04-07 NOTE — Interval H&P Note (Signed)
History and Physical Interval Note:  04/07/2023 11:49 AM  Jenny Krueger  has presented today for surgery, with the diagnosis of right knee osteoarthritis.  The various methods of treatment have been discussed with the patient and family. After consideration of risks, benefits and other options for treatment, the patient has consented to  Procedure(s) with comments: TOTAL KNEE ARTHROPLASTY (Right) - spinal and general as a surgical intervention.  The patient's history has been reviewed, patient examined, no change in status, stable for surgery.  I have reviewed the patient's chart and labs.  Questions were answered to the patient's satisfaction.     [name]

## 2023-04-07 NOTE — Anesthesia Procedure Notes (Signed)
Anesthesia Regional Block: Adductor canal block   Pre-Anesthetic Checklist: , timeout performed,  Correct Patient, Correct Site, Correct Laterality,  Correct Procedure, Correct Position, site marked,  Risks and benefits discussed,  Surgical consent,  Pre-op evaluation,  At surgeon's request and post-op pain management  Laterality: Right  Prep: chloraprep       Needles:  Injection technique: Single-shot  Needle Type: Echogenic Needle     Needle Length: 10cm  Needle Gauge: 21     Additional Needles:   Narrative:  Start time: 04/07/2023 12:36 PM End time: 04/07/2023 12:39 PM Injection made incrementally with aspirations every 5 mL.  Performed by: Personally  Anesthesiologist: Beryle Lathe, MD  Additional Notes: No pain on injection. No increased resistance to injection. Injection made in 5cc increments. Good needle visualization. Patient tolerated the procedure well.

## 2023-04-07 NOTE — OR Nursing (Signed)
Patient has order for Vancomycin 1 g and Levaquin 500mg  IV for surgical prophylaxis. Allergy list shows a possible reaction to Vanc- when I questioned the patient about her reaction she states "it was probably a rash".  Called and spoke to Arthur, [facility]- patient was given Vancomycin 1g back during hospitalization June 2023, with no noted issues. Per Marcelino Duster patient should be fine to receive Vancomycin- consider slowing infusion rate.

## 2023-04-07 NOTE — Plan of Care (Signed)

## 2023-04-07 NOTE — Anesthesia Preprocedure Evaluation (Addendum)
Anesthesia Evaluation  Patient identified by MRN, date of birth, ID band Patient awake    Reviewed: Allergy & Precautions, NPO status , Patient's Chart, lab work & pertinent test results  History of Anesthesia Complications Negative for: history of anesthetic complications  Airway Mallampati: II  TM Distance: >3 FB Neck ROM: Full    Dental  (+) Dental Advisory Given, Upper Dentures   Pulmonary former smoker   Pulmonary exam normal        Cardiovascular hypertension, Pt. on medications Normal cardiovascular exam     Neuro/Psych negative neurological ROS  negative psych ROS   GI/Hepatic negative GI ROS, Neg liver ROS,,,  Endo/Other  Hypothyroidism    Renal/GU negative Renal ROS     Musculoskeletal  (+) Arthritis ,  Fibromyalgia -  Abdominal   Peds  Hematology negative hematology ROS (+)   Anesthesia Other Findings   Reproductive/Obstetrics                             Anesthesia Physical Anesthesia Plan  ASA: 2  Anesthesia Plan: Spinal   Post-op Pain Management: Tylenol PO (pre-op)* and Regional block*   Induction:   PONV Risk Score and Plan: 2 and Treatment may vary due to age or medical condition and Propofol infusion  Airway Management Planned: Natural Airway and Simple Face Mask  Additional Equipment: None  Intra-op Plan:   Post-operative Plan:   Informed Consent: I have reviewed the patients History and Physical, chart, labs and discussed the procedure including the risks, benefits and alternatives for the proposed anesthesia with the patient or authorized representative who has indicated his/her understanding and acceptance.       Plan Discussed with: CRNA and Anesthesiologist  Anesthesia Plan Comments: (Labs reviewed, platelets acceptable. Discussed risks and benefits of spinal, including spinal/epidural hematoma, infection, failed block, and PDPH. Patient  expressed understanding and wished to proceed. )       Anesthesia Quick Evaluation

## 2023-04-07 NOTE — Anesthesia Procedure Notes (Signed)
Spinal  Patient location during procedure: OR Start time: 04/07/2023 1:43 PM End time: 04/07/2023 1:46 PM Reason for block: surgical anesthesia Staffing Performed: anesthesiologist  Anesthesiologist: Beryle Lathe, MD Performed by: Beryle Lathe, MD Authorized by: Beryle Lathe, MD   Preanesthetic Checklist Completed: patient identified, IV checked, risks and benefits discussed, surgical consent, monitors and equipment checked, pre-op evaluation and timeout performed Spinal Block Patient position: sitting Prep: DuraPrep Patient monitoring: heart rate, cardiac monitor, continuous pulse ox and blood pressure Approach: midline Location: L3-4 Injection technique: single-shot Needle Needle type: Pencan  Needle gauge: 24 G Additional Notes Consent was obtained prior to the procedure with all questions answered and concerns addressed. Risks including, but not limited to, bleeding, infection, nerve damage, paralysis, failed block, inadequate analgesia, allergic reaction, high spinal, itching, and headache were discussed and the patient wished to proceed. Functioning IV was confirmed and monitors were applied. Sterile prep and drape, including hand hygiene, mask, and sterile gloves were used. The patient was positioned and the spine was prepped. The skin was anesthetized with lidocaine. Free flow of clear CSF was obtained prior to injecting local anesthetic into the CSF. The spinal needle aspirated freely following injection. The needle was carefully withdrawn. The patient tolerated the procedure well.   Leslye Peer, MD

## 2023-04-07 NOTE — Plan of Care (Signed)
  Problem: Education: Goal: Knowledge of the prescribed therapeutic regimen will improve Outcome: Progressing   Problem: Activity: Goal: Range of joint motion will improve Outcome: Progressing   Problem: Pain Management: Goal: Pain level will decrease with appropriate interventions Outcome: Progressing   Problem: Elimination: Goal: Will not experience complications related to urinary retention Outcome: Progressing   Problem: Safety: Goal: Ability to remain free from injury will improve Outcome: Progressing   

## 2023-04-07 NOTE — Brief Op Note (Signed)
04/07/2023  3:57 PM  PATIENT:  Jenny Krueger  77 y.o. female  PRE-OPERATIVE DIAGNOSIS:  right knee osteoarthritis, end stage  POST-OPERATIVE DIAGNOSIS:  right knee osteoarthritis, end stage  PROCEDURE:  Procedure(s) with comments: TOTAL KNEE ARTHROPLASTY (Right) - spinal and general  DePuy Attune  SURGEON:  Surgeon(s) and Role:    Beverely Low, MD - Primary  PHYSICIAN ASSISTANT:   ASSISTANTS: Thea Gist, PA-C   ANESTHESIA:   regional and spinal  EBL:  75 mL   BLOOD ADMINISTERED:none  DRAINS: none   LOCAL MEDICATIONS USED:  MARCAINE     SPECIMEN:  No Specimen  DISPOSITION OF SPECIMEN:  N/A  COUNTS:  YES  TOURNIQUET:   Total Tourniquet Time Documented: Thigh (Right) - 89 minutes Total: Thigh (Right) - 89 minutes   DICTATION: .Other Dictation: Dictation Number [id number]  PLAN OF CARE: Admit for overnight observation  PATIENT DISPOSITION:  PACU - hemodynamically stable.   Delay start of Pharmacological VTE agent (>24hrs) due to surgical blood loss or risk of bleeding: no

## 2023-04-08 DIAGNOSIS — M1711 Unilateral primary osteoarthritis, right knee: Secondary | ICD-10-CM | POA: Diagnosis not present

## 2023-04-08 LAB — BASIC METABOLIC PANEL
Anion gap: 8 (ref 5–15)
BUN: 10 mg/dL (ref 8–23)
CO2: 22 mmol/L (ref 22–32)
Calcium: 8.9 mg/dL (ref 8.9–10.3)
Chloride: 108 mmol/L (ref 98–111)
Creatinine, Ser: 0.56 mg/dL (ref 0.44–1.00)
GFR, Estimated: >60 mL/min (ref >60)
Glucose, Bld: 127 mg/dL — ABNORMAL HIGH (ref 70–99)
Potassium: 3.8 mmol/L (ref 3.5–5.1)
Sodium: 138 mmol/L (ref 135–145)

## 2023-04-08 LAB — CBC
HCT: 33.9 % — ABNORMAL LOW (ref 36.0–46.0)
Hemoglobin: 11.5 g/dL — ABNORMAL LOW (ref 12.0–15.0)
MCH: 31.9 pg (ref 26.0–34.0)
MCHC: 33.9 g/dL (ref 30.0–36.0)
MCV: 93.9 fL (ref 80.0–100.0)
Platelets: 261 10*3/uL (ref 150–400)
RBC: 3.61 MIL/uL — ABNORMAL LOW (ref 3.87–5.11)
RDW: 11.9 % (ref 11.5–15.5)
WBC: 12.3 10*3/uL — ABNORMAL HIGH (ref 4.0–10.5)
nRBC: 0 % (ref 0.0–0.2)

## 2023-04-08 MED ORDER — ASPIRIN 81 MG PO CHEW: 81.0000 mg | CHEWABLE_TABLET | Freq: Two times a day (BID) | ORAL | 1 refills | Status: DC

## 2023-04-08 NOTE — Progress Notes (Addendum)
Subjective: 1 Day Post-Op Procedure(s) (LRB): TOTAL KNEE ARTHROPLASTY (Right) Patient reports pain as mild.   Reports minimal pain. Block has worn off, denies numbness or tingling.  No N/V, CP, SOB, or any other c/o. Feels ready for D/C to home today.  Objective: Vital signs in last 24 hours: Temp:  [97.8 F (36.6 C)-98.8 F (37.1 C)] 97.8 F (36.6 C) (06/01 0532) Pulse Rate:  [64-100] 80 (06/01 0532) Resp:  [14-19] 17 (06/01 0532) BP: (133-191)/(66-107) 145/107 (06/01 0532) SpO2:  [93 %-100 %] 95 % (06/01 0532) Weight:  [81.6 kg] 81.6 kg (05/31 0950)  Intake/Output from previous day: 05/31 0701 - 06/01 0700 In: 2562.8 [P.O.:717; I.V.:1345.8; IV Piggyback:500] Out: 2675 [Urine:2600; Blood:75] Intake/Output this shift: Total I/O In: 240 [P.O.:240] Out: -   Recent Labs    04/08/23 0352  HGB 11.5*   Recent Labs    04/08/23 0352  WBC 12.3*  RBC 3.61*  HCT 33.9*  PLT 261   Recent Labs    04/08/23 0352  NA 138  K 3.8  CL 108  CO2 22  BUN 10  CREATININE 0.56  GLUCOSE 127*  CALCIUM 8.9   No results for input(s): "LABPT", "INR" in the last 72 hours.  Neurologically intact ABD soft Neurovascular intact Sensation intact distally Intact pulses distally Dorsiflexion/Plantar flexion intact Incision: dressing C/D/I and no drainage No cellulitis present Compartment soft No sign of DVT   Assessment/Plan: 1 Day Post-Op Procedure(s) (LRB): TOTAL KNEE ARTHROPLASTY (Right) Advance diet Up with therapy D/C IV fluids D/C home after passes PT  Discussed D/C instructions Dressing change prior to D/C Shower chair ordered  Patient's anticipated LOS is less than 2 midnights, meeting these requirements: - Younger than 56 - Lives within 1 hour of care - Has a competent adult at home to recover with post-op recover - NO history of  - Chronic pain requiring opiods  - Diabetes  - Coronary Artery Disease  - Heart failure  - Heart attack  - Stroke  - DVT/VTE  -  Cardiac arrhythmia  - Respiratory Failure/COPD  - Renal failure  - Anemia  - Advanced Liver disease     Dorothy Spark 04/08/2023, 9:22 AM

## 2023-04-08 NOTE — Discharge Summary (Signed)
Physician Discharge Summary   Patient ID: Jenny Krueger MRN: [id number] DOB/AGE: [birth year] 77 y.o.  Admit date: 04/07/2023 Discharge date: 04/08/23  Primary Diagnosis: right knee primary osteoarthritis  Admission Diagnoses:  Past Medical History:  Diagnosis Date   Arthritis    History of kidney stones    Hyperlipidemia    Hypertension    Hypothyroid    Meningitis 04/2022   Discharge Diagnoses:   Principal Problem:   Status post total knee replacement, right  Estimated body mass index is 29.95 kg/m as calculated from the following:   Height as of this encounter: 5\' 5"  (1.651 m).   Weight as of this encounter: 81.6 kg.  Procedure:  Procedure(s) (LRB): TOTAL KNEE ARTHROPLASTY (Right)   Consults: None  HPI: see H&P Laboratory Data: Admission on 04/07/2023  Component Date Value Ref Range Status   WBC 04/08/2023 12.3 (H)  4.0 - 10.5 K/uL Final   RBC 04/08/2023 3.61 (L)  3.87 - 5.11 MIL/uL Final   Hemoglobin 04/08/2023 11.5 (L)  12.0 - 15.0 g/dL Final   HCT 40/98/1191 33.9 (L)  36.0 - 46.0 % Final   MCV 04/08/2023 93.9  80.0 - 100.0 fL Final   MCH 04/08/2023 31.9  26.0 - 34.0 pg Final   MCHC 04/08/2023 33.9  30.0 - 36.0 g/dL Final   RDW 47/82/9562 11.9  11.5 - 15.5 % Final   Platelets 04/08/2023 261  150 - 400 K/uL Final   nRBC 04/08/2023 0.0  0.0 - 0.2 % Final   Performed at [facility], [address] [place], [place] [address]   Sodium 04/08/2023 138  135 - 145 mmol/L Final   Potassium 04/08/2023 3.8  3.5 - 5.1 mmol/L Final   Chloride 04/08/2023 108  98 - 111 mmol/L Final   CO2 04/08/2023 22  22 - 32 mmol/L Final   Glucose, Bld 04/08/2023 127 (H)  70 - 99 mg/dL Final   Glucose reference range applies only to samples taken after fasting for at least 8 hours.   BUN 04/08/2023 10  8 - 23 mg/dL Final   Creatinine, Ser 04/08/2023 0.56  0.44 - 1.00 mg/dL Final   Calcium 57/84/6962 8.9  8.9 - 10.3 mg/dL Final   GFR, Estimated 04/08/2023 >60  >60  mL/min Final   Comment: (NOTE) Calculated using the CKD-EPI Creatinine Equation (2021)    Anion gap 04/08/2023 8  5 - 15 Final   Performed at [facility], [address], [place], [place] [address]  Hospital Outpatient Visit on 03/30/2023  Component Date Value Ref Range Status   Sodium 03/30/2023 139  135 - 145 mmol/L Final   Potassium 03/30/2023 3.5  3.5 - 5.1 mmol/L Final   Chloride 03/30/2023 105  98 - 111 mmol/L Final   CO2 03/30/2023 25  22 - 32 mmol/L Final   Glucose, Bld 03/30/2023 101 (H)  70 - 99 mg/dL Final   Glucose reference range applies only to samples taken after fasting for at least 8 hours.   BUN 03/30/2023 11  8 - 23 mg/dL Final   Creatinine, Ser 03/30/2023 0.73  0.44 - 1.00 mg/dL Final   Calcium 13/24/4010 9.5  8.9 - 10.3 mg/dL Final   GFR, Estimated 03/30/2023 >60  >60 mL/min Final   Comment: (NOTE) Calculated using the CKD-EPI Creatinine Equation (2021)    Anion gap 03/30/2023 9  5 - 15 Final   Performed at [facility], [address] [place], [place] [address]  WBC 03/30/2023 6.4  4.0 - 10.5 K/uL Final   RBC 03/30/2023 4.45  3.87 - 5.11 MIL/uL Final   Hemoglobin 03/30/2023 13.8  12.0 - 15.0 g/dL Final   HCT 40/98/1191 41.6  36.0 - 46.0 % Final   MCV 03/30/2023 93.5  80.0 - 100.0 fL Final   MCH 03/30/2023 31.0  26.0 - 34.0 pg Final   MCHC 03/30/2023 33.2  30.0 - 36.0 g/dL Final   RDW 47/82/9562 12.5  11.5 - 15.5 % Final   Platelets 03/30/2023 294  150 - 400 K/uL Final   nRBC 03/30/2023 0.0  0.0 - 0.2 % Final   Performed at [facility], [address], [place], [place] [address]   MRSA, PCR 03/30/2023 NEGATIVE  NEGATIVE Final   Staphylococcus aureus 03/30/2023 NEGATIVE  NEGATIVE Final   Comment: (NOTE) The Xpert SA Assay (FDA approved for NASAL specimens in patients 65 years of age and older), is one component of a comprehensive surveillance program. It is not intended to diagnose infection nor  to guide or monitor treatment. Performed at [facility], [address] [place], [place] [address]      X-Rays:No results found.  EKG: Orders placed or performed during the hospital encounter of 04/07/22   ED EKG   ED EKG     Hospital Course: Jenny Krueger is a 77 y.o. who was admitted to [facility]. They were brought to the operating room on 04/07/2023 and underwent Procedure(s): TOTAL KNEE ARTHROPLASTY.  Patient tolerated the procedure well and was later transferred to the recovery room and then to the orthopaedic floor for postoperative care.  They were given PO and IV analgesics for pain control following their surgery.  They were given 24 hours of postoperative antibiotics of  Anti-infectives (From admission, onward)    Start     Dose/Rate Route Frequency Ordered Stop   04/07/23 2300  vancomycin (VANCOCIN) IVPB 1000 mg/200 mL premix       Note to Pharmacy: Please assist with dosing for antibiotic prophylaxis for total knee for patient weight and age with renal considerations, thanks!   1,000 mg 200 mL/hr over 60 Minutes Intravenous Every 12 hours 04/07/23 1737 04/08/23 0000   04/07/23 1830  levofloxacin (LEVAQUIN) tablet 500 mg  Status:  Discontinued        500 mg Oral Daily 04/07/23 1737 04/07/23 1740   04/07/23 1000  levofloxacin (LEVAQUIN) IVPB 500 mg        500 mg 100 mL/hr over 60 Minutes Intravenous On call to O.R. 04/07/23 0950 04/07/23 1440   04/07/23 1000  vancomycin (VANCOCIN) IVPB 1000 mg/200 mL premix        1,000 mg 200 mL/hr over 60 Minutes Intravenous On call to O.R. 04/07/23 0950 04/07/23 1258      and started on DVT prophylaxis in the form of Aspirin, TED hose, and SCDs .   PT and OT were ordered for total joint protocol.  Discharge planning consulted to help with postop disposition and equipment needs.  Patient had a good night on the evening of surgery.  They started to get up OOB with therapy on day one. By day one, the  patient had progressed with therapy and meeting their goals.  Incision was healing well.  Patient was seen in rounds and was ready to go home.   Diet: Regular diet Activity:WBAT Follow-up:in 10-14 days Disposition - Home Discharged Condition: good   Discharge Instructions     Call  MD / Call 911   Complete by: As directed    If you experience chest pain or shortness of breath, CALL 911 and be transported to the hospital emergency room.  If you develope a fever above 101 F, pus (white drainage) or increased drainage or redness at the wound, or calf pain, call your surgeon's office.   Constipation Prevention   Complete by: As directed    Drink plenty of fluids.  Prune juice may be helpful.  You may use a stool softener, such as Colace (over the counter) 100 mg twice a day.  Use MiraLax (over the counter) for constipation as needed.   Diet - low sodium heart healthy   Complete by: As directed    Increase activity slowly as tolerated   Complete by: As directed    Post-operative opioid taper instructions:   Complete by: As directed    POST-OPERATIVE OPIOID TAPER INSTRUCTIONS: It is important to wean off of your opioid medication as soon as possible. If you do not need pain medication after your surgery it is ok to stop day one. Opioids include: Codeine, Hydrocodone(Norco, Vicodin), Oxycodone(Percocet, oxycontin) and hydromorphone amongst others.  Long term and even short term use of opiods can cause: Increased pain response Dependence Constipation Depression Respiratory depression And more.  Withdrawal symptoms can include Flu like symptoms Nausea, vomiting And more Techniques to manage these symptoms Hydrate well Eat regular healthy meals Stay active Use relaxation techniques(deep breathing, meditating, yoga) Do Not substitute Alcohol to help with tapering If you have been on opioids for less than two weeks and do not have pain than it is ok to stop all together.  Plan to  wean off of opioids This plan should start within one week post op of your joint replacement. Maintain the same interval or time between taking each dose and first decrease the dose.  Cut the total daily intake of opioids by one tablet each day Next start to increase the time between doses. The last dose that should be eliminated is the evening dose.         Allergies as of 04/08/2023       Reactions   Cefepime Rash   Ceftriaxone Rash   Losartan Hives, Shortness Of Breath, Rash   Hydrocortisone Rash, Other (See Comments)   Bad rash   Penicillins Rash   Latex Other (See Comments)   "I turned red and developed sores in my mouth"   Lisinopril Cough   Takes PTA 04/07/23   Shrimp Extract Other (See Comments)   "Bumps in my mouth developed."   Tramadol Other (See Comments)   "DID NOT AGREE WITH ME"   Vancomycin Other (See Comments)   Thinks she had a rash but not sure. Tolerated 04/07/23   Codeine Nausea Only   Nickel Rash, Other (See Comments)   Skin breaks out if exposed   Other Rash, Other (See Comments)   CANNOT USE ANYTHING OTHER THAN 14K GOLD   Oxycodone Nausea Only        Medication List     TAKE these medications    amLODipine 10 MG tablet Commonly known as: NORVASC Take 1 tablet (10 mg total) by mouth daily.   aspirin 81 MG chewable tablet Chew 1 tablet (81 mg total) by mouth 2 (two) times daily.   azelastine 0.1 % nasal spray Commonly known as: ASTELIN Place 1 spray into both nostrils 2 (two) times daily.   benzonatate 200 MG capsule Commonly known as: TESSALON  Take 200 mg by mouth See admin instructions. Take 200 mg by mouth with each dose of Lisinopril.   Glucosamine HCl 500 MG Tabs Take 500 mg by mouth daily.   hydrocortisone cream 1 % Apply topically 3 (three) times daily.   levofloxacin 500 MG tablet Commonly known as: LEVAQUIN Take 1 tablet (500 mg total) by mouth daily.   lisinopril 10 MG tablet Commonly known as: ZESTRIL Take 10 mg by  mouth daily.   methocarbamol 500 MG tablet Commonly known as: ROBAXIN Take 1 tablet (500 mg total) by mouth every 8 (eight) hours as needed for muscle spasms.   NON FORMULARY Take 1 capsule by mouth See admin instructions. Biotin 800 mcg + calcium 62 g- Take 1 capsule by mouth once a day   NON FORMULARY Take 1 capsule by mouth See admin instructions. Omega-3 + EPA + DHA capsules- Take 1 capsule by mouth once a day   NON FORMULARY Take 1 tablet by mouth See admin instructions. T-balance plus women's health capsules- Take 1 capsule by mouth once a day   NON FORMULARY Take 1 tablet by mouth See admin instructions. [employer] tabs- Take 1 tablet by mouth once a day   ondansetron 4 MG tablet Commonly known as: ZOFRAN Take 1 tablet (4 mg total) by mouth every 6 (six) hours as needed for nausea. What changed: Another medication with the same name was added. Make sure you understand how and when to take each.   ondansetron 4 MG tablet Commonly known as: Zofran Take 1 tablet (4 mg total) by mouth every 8 (eight) hours as needed for nausea, vomiting or refractory nausea / vomiting. What changed: You were already taking a medication with the same name, and this prescription was added. Make sure you understand how and when to take each.   oxyCODONE 5 MG immediate release tablet Commonly known as: Oxy IR/ROXICODONE Take 1 tablet (5 mg total) by mouth every 6 (six) hours as needed for moderate pain, severe pain or breakthrough pain. What changed: reasons to take this   polyethylene glycol 17 g packet Commonly known as: MIRALAX / GLYCOLAX Take 17 g by mouth daily as needed for mild constipation.   Probiotic Acidophilus Caps Take 1 capsule by mouth daily.   triamcinolone cream 0.1 % Commonly known as: KENALOG Apply 1 Application topically 2 (two) times daily as needed (to itchy or irritated areas).   Vitamin D3 10 MCG (400 UNIT) Caps Take 400 Units by mouth daily.         Follow-up Information     Beverely Low, MD. Call in 2 week(s).   Specialty: Orthopedic Surgery Why: call [phone] for appt Contact information: [address] [place] [address] [phone]                 Signed: Andrez Grime, PA-C Orthopaedic Surgery 04/08/2023, 9:25 AM

## 2023-04-08 NOTE — TOC Transition Note (Addendum)
Transition of Care [facility]) - CM/SW Discharge Note   Patient Details  Name: Jenny Krueger MRN: [id number] Date of Birth: [birth year]  [facility]) CM/SW Contact:  Georgie Chard, LCSW Phone Number: 04/08/2023, 1:34 PM   Clinical Narrative:    Patient has agreed to purchase through [employer].   Patient will DC home today with family. CSW discuss DME as well as where to purchase. At this time there are no TOC needs.      Barriers to Discharge: No Barriers Identified   Patient Goals and CMS Choice      Discharge Placement                  Patient to be transferred to facility by: family   Patient and family notified of of transfer: 04/08/23  Discharge Plan and Services Additional resources added to the After Visit Summary for                                       Social Determinants of Health (SDOH) Interventions SDOH Screenings   Food Insecurity: No Food Insecurity (04/07/2023)  Housing: Low Risk  (04/07/2023)  Transportation Needs: No Transportation Needs (04/07/2023)  Utilities: Not At Risk (04/07/2023)  Tobacco Use: Medium Risk (04/07/2023)     Readmission Risk Interventions     No data to display

## 2023-04-08 NOTE — Progress Notes (Signed)
Physical Therapy Treatment Patient Details Name: Jenny Krueger MRN: [id number] DOB: [birth year] Today's Date: 04/08/2023   History of Present Illness 77 yo female s/p R TKA 04/07/23    PT Comments    Pt continues to progress well. Moderate pain with activity (pt opting to use tylenol and muscle relaxer only on today). Encouraged pt to ambulate often at home, as tolerated. Also encouraged her to use "bone foam" as tolerated and to perform HEP ROM exercises daily until she begins OPPT.     Recommendations for follow up therapy are one component of a multi-disciplinary discharge planning process, led by the attending physician.  Recommendations may be updated based on patient status, additional functional criteria and insurance authorization.  Follow Up Recommendations       Assistance Recommended at Discharge Intermittent Supervision/Assistance  Patient can return home with the following Assist for transportation;Assistance with cooking/housework;Help with stairs or ramp for entrance;A little help with bathing/dressing/bathroom   Equipment Recommendations  None recommended by PT (pt reports she already has a RW)    Recommendations for Other Services       Precautions / Restrictions Precautions Precautions: Fall Restrictions Weight Bearing Restrictions: No RLE Weight Bearing: Weight bearing as tolerated     Mobility  Bed Mobility Overal bed mobility: Needs Assistance Bed Mobility: Supine to Sit     Supine to sit: Supervision, HOB elevated     General bed mobility comments: oob with RN    Transfers Overall transfer level: Needs assistance Equipment used: Rolling walker (2 wheels) Transfers: Sit to/from Stand Sit to Stand: Supervision           General transfer comment: Supv for safety. Cues provided.    Ambulation/Gait Ambulation/Gait assistance: Min guard Gait Distance (Feet): 135 Feet Assistive device: Rolling walker (2 wheels) Gait Pattern/deviations:  Step-through pattern, Decreased stride length       General Gait Details: Cues for safety, RW proximity. No overt LOB with RW use. Pt tolerated distance well.   Stairs             Wheelchair Mobility    Modified Rankin (Stroke Patients Only)       Balance Overall balance assessment: Needs assistance         Standing balance support: During functional activity, Reliant on assistive device for balance Standing balance-Leahy Scale: Fair                              Cognition Arousal/Alertness: Awake/alert Behavior During Therapy: WFL for tasks assessed/performed Overall Cognitive Status: Within Functional Limits for tasks assessed                                          Exercises     General Comments        Pertinent Vitals/Pain Pain Assessment Pain Assessment: Faces Pain Score: 8  Faces Pain Scale: Hurts even more Pain Location: R knee with activity Pain Descriptors / Indicators: Aching, Sore, Tightness Pain Intervention(s): Monitored during session, Repositioned, Ice applied    Home Living                          Prior Function            PT Goals (current goals can now be found in the care plan section) Acute  Rehab PT Goals Patient Stated Goal: home PT Goal Formulation: With patient/family Time For Goal Achievement: 04/22/23 Potential to Achieve Goals: Good Progress towards PT goals: Progressing toward goals    Frequency    7X/week      PT Plan Current plan remains appropriate    Co-evaluation              AM-PAC PT "6 Clicks" Mobility   Outcome Measure  Help needed turning from your back to your side while in a flat bed without using bedrails?: None Help needed moving from lying on your back to sitting on the side of a flat bed without using bedrails?: None Help needed moving to and from a bed to a chair (including a wheelchair)?: A Little Help needed standing up from a chair using  your arms (e.g., wheelchair or bedside chair)?: A Little Help needed to walk in hospital room?: A Little Help needed climbing 3-5 steps with a railing? : A Little 6 Click Score: 20    End of Session Equipment Utilized During Treatment: Gait belt Activity Tolerance: Patient tolerated treatment well Patient left: in chair;with call bell/phone within reach;with family/visitor present   PT Visit Diagnosis: Pain;Other abnormalities of gait and mobility (R26.89) Pain - Right/Left: Right Pain - part of body: Knee     Time: 1610-9604 PT Time Calculation (min) (ACUTE ONLY): 20 min  Charges:  $Gait Training: 8-22 mins                        Faye Ramsay, PT Acute Rehabilitation  Office: [phone]

## 2023-04-08 NOTE — Evaluation (Signed)
Physical Therapy Evaluation Patient Details Name: Jenny Krueger MRN: [id number] DOB: 1946/05/29 Today's Date: 04/08/2023  History of Present Illness  77 yo female s/p R TKA 04/07/23  Clinical Impression  On eval, pt was Min guard A for mobility. She walked ~100 feet with a RW. Moderate pain with activity-pt is hesitant to take pain meds. She tolerated session well. She did c/o "feeling hot" towards end of ambulation distance-denied dizziness. Will plan to have a 2nd session prior to d/c home later today.        Recommendations for follow up therapy are one component of a multi-disciplinary discharge planning process, led by the attending physician.  Recommendations may be updated based on patient status, additional functional criteria and insurance authorization.  Follow Up Recommendations       Assistance Recommended at Discharge Intermittent Supervision/Assistance  Patient can return home with the following  Assist for transportation;Assistance with cooking/housework;Help with stairs or ramp for entrance;A little help with bathing/dressing/bathroom    Equipment Recommendations None recommended by PT (pt reports she already has a RW)  Recommendations for Other Services       Functional Status Assessment Patient has had a recent decline in their functional status and demonstrates the ability to make significant improvements in function in a reasonable and predictable amount of time.     Precautions / Restrictions Precautions Precautions: Fall Restrictions Weight Bearing Restrictions: No RLE Weight Bearing: Weight bearing as tolerated      Mobility  Bed Mobility Overal bed mobility: Needs Assistance Bed Mobility: Supine to Sit     Supine to sit: Supervision, HOB elevated     General bed mobility comments: Supv for safety. Cues provided.    Transfers Overall transfer level: Needs assistance Equipment used: Rolling walker (2 wheels) Transfers: Sit to/from Stand Sit  to Stand: Supervision, From elevated surface           General transfer comment: Supv for safety. Cues provided.    Ambulation/Gait Ambulation/Gait assistance: Min guard Gait Distance (Feet): 100 Feet Assistive device: Rolling walker (2 wheels) Gait Pattern/deviations: Step-through pattern, Decreased stride length       General Gait Details: Cues for safety, RW proximity. No overt LOB with RW use. Pt tolerated distance well. She did report "feeling hot" towards end of distance-denied dizziness.  Stairs            Wheelchair Mobility    Modified Rankin (Stroke Patients Only)       Balance Overall balance assessment: Needs assistance         Standing balance support: During functional activity, Reliant on assistive device for balance Standing balance-Leahy Scale: Fair                               Pertinent Vitals/Pain Pain Assessment Pain Assessment: 0-10 Pain Score: 8  Pain Location: R knee with activity Pain Descriptors / Indicators: Aching, Sore Pain Intervention(s): Monitored during session, Repositioned    Home Living Family/patient expects to be discharged to:: Private residence Living Arrangements: Alone Available Help at Discharge: Family;Available PRN/intermittently Type of Home: Apartment Home Access: Level entry       Home Layout: One level Home Equipment: Agricultural consultant (2 wheels);BSC/3in1      Prior Function Prior Level of Function : Independent/Modified Independent                     Hand Dominance  Extremity/Trunk Assessment   Upper Extremity Assessment Upper Extremity Assessment: Overall WFL for tasks assessed    Lower Extremity Assessment Lower Extremity Assessment: Generalized weakness    Cervical / Trunk Assessment Cervical / Trunk Assessment: Normal  Communication   Communication: No difficulties  Cognition Arousal/Alertness: Awake/alert Behavior During Therapy: WFL for tasks  assessed/performed Overall Cognitive Status: Within Functional Limits for tasks assessed                                          General Comments      Exercises Total Joint Exercises Ankle Circles/Pumps: AROM, Both, 10 reps Quad Sets: AROM, Right, 10 reps Hip ABduction/ADduction: AROM, Right, 10 reps Straight Leg Raises: AROM, Right, 10 reps Long Arc Quad: AROM, Right, 10 reps, Seated Knee Flexion: AROM, Right, 10 reps, Seated Goniometric ROM: ~5-85 degrees   Assessment/Plan    PT Assessment Patient needs continued PT services  PT Problem List Decreased strength;Decreased range of motion;Decreased activity tolerance;Decreased balance;Decreased mobility;Decreased knowledge of use of DME;Pain       PT Treatment Interventions DME instruction;Gait training;Functional mobility training;Therapeutic activities;Patient/family education;Balance training;Therapeutic exercise    PT Goals (Current goals can be found in the Care Plan section)  Acute Rehab PT Goals Patient Stated Goal: home PT Goal Formulation: With patient/family Time For Goal Achievement: 04/22/23 Potential to Achieve Goals: Good    Frequency 7X/week     Co-evaluation               AM-PAC PT "6 Clicks" Mobility  Outcome Measure Help needed turning from your back to your side while in a flat bed without using bedrails?: None Help needed moving from lying on your back to sitting on the side of a flat bed without using bedrails?: None Help needed moving to and from a bed to a chair (including a wheelchair)?: A Little Help needed standing up from a chair using your arms (e.g., wheelchair or bedside chair)?: A Little Help needed to walk in hospital room?: A Little Help needed climbing 3-5 steps with a railing? : A Little 6 Click Score: 20    End of Session Equipment Utilized During Treatment: Gait belt Activity Tolerance: Patient tolerated treatment well Patient left: in chair;with call  bell/phone within reach;with family/visitor present   PT Visit Diagnosis: Pain;Other abnormalities of gait and mobility (R26.89)    Time: 5284-1324 PT Time Calculation (min) (ACUTE ONLY): 37 min   Charges:   PT Evaluation $PT Eval Low Complexity: 1 Low PT Treatments $Gait Training: 8-22 mins           Faye Ramsay, PT Acute Rehabilitation  Office: [phone]

## 2023-04-08 NOTE — Progress Notes (Signed)
Transition of Care [facility]) - Inpatient Brief Assessment   Patient Details  Name: Jenny Krueger MRN: [id number] Date of Birth: Oct 04, 1946  Transition of Care [facility]) CM/SW Contact:    Georgie Chard, LCSW Phone Number: 04/08/2023, 1:31 PM   Clinical Narrative:  This CSW spoke to the patient about DME. At this time the patient needed a shower chair as well as a commode pan. Patient INS does not cover that DME equipment. This CSW advised patient of this as well as stores that provided that DME. Patient stated that her family will take care of it. This CSW has asked the patient was she in need of any other resources at this time the patient stated that was all she needed. AT this time there are no further TOC needs.   Transition of Care Asessment: Insurance and Status: (P) Insurance coverage has been reviewed Patient has primary care physician: (P) Yes Home environment has been reviewed: (P) yes Prior level of function:: (P) Home based. Prior/Current Home Services: (P) No current home services   Readmission risk has been reviewed: (P) Yes Transition of care needs: (P) transition of care needs identified, TOC will continue to follow (DME patient will have to pay of pocket INS does not cover.)

## 2023-04-09 ENCOUNTER — Encounter (HOSPITAL_COMMUNITY): Payer: Self-pay | Admitting: Orthopedic Surgery

## 2023-04-09 NOTE — Anesthesia Postprocedure Evaluation (Signed)
Anesthesia Post Note  Patient: Jenny Krueger  Procedure(s) Performed: TOTAL KNEE ARTHROPLASTY (Right: Knee)     Patient location during evaluation: PACU Anesthesia Type: Spinal Level of consciousness: awake and alert Pain management: pain level controlled Vital Signs Assessment: post-procedure vital signs reviewed and stable Respiratory status: spontaneous breathing and respiratory function stable Cardiovascular status: blood pressure returned to baseline and stable Postop Assessment: spinal receding and no apparent nausea or vomiting Anesthetic complications: no   No notable events documented.  Last Vitals:  Vitals:   04/08/23 0532 04/08/23 0930  BP: (!) 145/107 (!) 154/72  Pulse: 80 86  Resp: 17 15  Temp: 36.6 C 36.8 C  SpO2: 95% 97%    Last Pain:  Vitals:   04/08/23 1414  TempSrc:   PainSc: 4                  Beryle Lathe

## 2023-04-21 ENCOUNTER — Other Ambulatory Visit (HOSPITAL_COMMUNITY): Payer: Self-pay | Admitting: Orthopedic Surgery

## 2023-04-21 ENCOUNTER — Ambulatory Visit (HOSPITAL_COMMUNITY)
Admission: RE | Admit: 2023-04-21 | Discharge: 2023-04-21 | Disposition: A | Discharge status: Home / Self Care | Payer: Medicare HMO | Source: Ambulatory Visit | Attending: Cardiovascular Disease | Admitting: Cardiovascular Disease

## 2023-04-21 DIAGNOSIS — M25561 Pain in right knee: Secondary | ICD-10-CM | POA: Diagnosis present

## 2023-07-27 NOTE — H&P (Signed)
Patient's anticipated LOS is less than 2 midnights, meeting these requirements: - Younger than 39 - Lives within 1 hour of care - Has a competent adult at home to recover with post-op recover - NO history of  - Chronic pain requiring opiods  - Diabetes  - Coronary Artery Disease  - Heart failure  - Heart attack  - Stroke  - DVT/VTE  - Cardiac arrhythmia  - Respiratory Failure/COPD  - Renal failure  - Anemia  - Advanced Liver disease     Jenny Krueger is an 77 y.o. female.    Chief Complaint: left knee pain  HPI: Pt is a 77 y.o. female complaining of left knee pain for multiple years. Pain had continually increased since the beginning. X-rays in the clinic show end-stage arthritic changes of the left knee. Pt has tried various conservative treatments which have failed to alleviate their symptoms, including injections and therapy. Various options are discussed with the patient. Risks, benefits and expectations were discussed with the patient. Patient understand the risks, benefits and expectations and wishes to proceed with surgery.   PCP:  Ladora Daniel, PA-C  D/C Plans: Home  PMH: Past Medical History:  Diagnosis Date   Arthritis    History of kidney stones    Hyperlipidemia    Hypertension    Hypothyroid    Meningitis 04/2022    PSH: Past Surgical History:  Procedure Laterality Date   No prior surgery     TOTAL KNEE ARTHROPLASTY Right 04/07/2023   Procedure: TOTAL KNEE ARTHROPLASTY;  Surgeon: Beverely Low, MD;  Location: [facility]S;  Service: Orthopedics;  Laterality: Right;  spinal and general   WISDOM TOOTH EXTRACTION     age 59    Social History:  reports that she quit smoking about 33 years ago. Her smoking use included cigarettes. She has never used smokeless tobacco. She reports current alcohol use. She reports that she does not use drugs. BMI: Estimated body mass index is 29.95 kg/m as calculated from the following:   Height as of 04/07/23: 5\' 5"  (1.651  m).   Weight as of 04/07/23: 81.6 kg.  Lab Results  Component Value Date   ALBUMIN 2.7 (L) 04/14/2022   Diabetes: Patient does not have a diagnosis of diabetes.     Smoking Status:      Allergies:  Allergies  Allergen Reactions   Cefepime Rash   Ceftriaxone Rash   Losartan Hives, Shortness Of Breath and Rash   Hydrocortisone Rash and Other (See Comments)    Bad rash   Penicillins Rash   Latex Other (See Comments)    "I turned red and developed sores in my mouth"   Lisinopril Cough    Takes PTA 04/07/23   Shrimp Extract Other (See Comments)    "Bumps in my mouth developed."   Tramadol Other (See Comments)    "DID NOT AGREE WITH ME"   Vancomycin Other (See Comments)    Thinks she had a rash but not sure. Tolerated 04/07/23   Codeine Nausea Only   Nickel Rash and Other (See Comments)    Skin breaks out if exposed   Other Rash and Other (See Comments)    CANNOT USE ANYTHING OTHER THAN 14K GOLD   Oxycodone Nausea Only    Medications: No current facility-administered medications for this encounter.   Current Outpatient Medications  Medication Sig Dispense Refill   amLODipine (NORVASC) 10 MG tablet Take 1 tablet (10 mg total) by mouth daily. (Patient not taking:  Reported on 03/30/2023) 30 tablet 0   aspirin 81 MG chewable tablet Chew 1 tablet (81 mg total) by mouth 2 (two) times daily. 60 tablet 1   azelastine (ASTELIN) 0.1 % nasal spray Place 1 spray into both nostrils 2 (two) times daily.     benzonatate (TESSALON) 200 MG capsule Take 200 mg by mouth See admin instructions. Take 200 mg by mouth with each dose of Lisinopril.     Cholecalciferol (VITAMIN D3) 10 MCG (400 UNIT) CAPS Take 400 Units by mouth daily.     Glucosamine HCl 500 MG TABS Take 500 mg by mouth daily.     hydrocortisone cream 1 % Apply topically 3 (three) times daily. (Patient not taking: Reported on 03/30/2023) 30 g 0   Lactobacillus (PROBIOTIC ACIDOPHILUS) CAPS Take 1 capsule by mouth daily.      levofloxacin (LEVAQUIN) 500 MG tablet Take 1 tablet (500 mg total) by mouth daily. (Patient not taking: Reported on 03/30/2023) 8 tablet 0   lisinopril (ZESTRIL) 10 MG tablet Take 10 mg by mouth daily.     methocarbamol (ROBAXIN) 500 MG tablet Take 1 tablet (500 mg total) by mouth every 8 (eight) hours as needed for muscle spasms. 40 tablet 1   NON FORMULARY Take 1 capsule by mouth See admin instructions. Biotin 800 mcg + calcium 62 g- Take 1 capsule by mouth once a day     NON FORMULARY Take 1 capsule by mouth See admin instructions. Omega-3 + EPA + DHA capsules- Take 1 capsule by mouth once a day     NON FORMULARY Take 1 tablet by mouth See admin instructions. T-balance plus women's health capsules- Take 1 capsule by mouth once a day     NON FORMULARY Take 1 tablet by mouth See admin instructions. [employer] tabs- Take 1 tablet by mouth once a day     ondansetron (ZOFRAN) 4 MG tablet Take 1 tablet (4 mg total) by mouth every 6 (six) hours as needed for nausea. (Patient not taking: Reported on 03/30/2023) 20 tablet 0   ondansetron (ZOFRAN) 4 MG tablet Take 1 tablet (4 mg total) by mouth every 8 (eight) hours as needed for nausea, vomiting or refractory nausea / vomiting. 30 tablet 1   oxyCODONE (OXY IR/ROXICODONE) 5 MG immediate release tablet Take 1 tablet (5 mg total) by mouth every 6 (six) hours as needed for moderate pain, severe pain or breakthrough pain. 30 tablet 0   polyethylene glycol (MIRALAX / GLYCOLAX) 17 g packet Take 17 g by mouth daily as needed for mild constipation. 14 each 0   triamcinolone cream (KENALOG) 0.1 % Apply 1 Application topically 2 (two) times daily as needed (to itchy or irritated areas).      No results found for this or any previous visit (from the past 48 hour(s)). No results found.  ROS: Pain with rom of the left lower extremity  Physical Exam: Alert and oriented 77 y.o. female in no acute distress Cranial nerves 2-12 intact Cervical spine: full  rom with no tenderness, nv intact distally Chest: active breath sounds bilaterally, no wheeze rhonchi or rales Heart: regular rate and rhythm, no murmur Abd: non tender non distended with active bowel sounds Hip is stable with rom  Left knee painful rom with crepitus Nv intact distally No rashes or edema Antalgic gait  Assessment/Plan Assessment: left knee end stage osteoarthritis  Plan:  Patient will undergo a left total knee by Dr. Ranell Patrick at [place] Risks benefits and expectations  were discussed with the patient. Patient understand risks, benefits and expectations and wishes to proceed. Preoperative templating of the joint replacement has been completed, documented, and submitted to the Operating Room personnel in order to optimize intra-operative equipment management.   Alphonsa Overall PA-C, MPAS [facility] is now [employer] [address], [place], [place] [address] Phone: [phone] [email or web address] Facebook  [employer]

## 2023-08-08 NOTE — Patient Instructions (Signed)
SURGICAL WAITING ROOM VISITATION  Patients having surgery or a procedure may have no more than 2 support people in the waiting area - these visitors may rotate.    Children under the age of 23 must have an adult with them who is not the patient.  Due to an increase in RSV and influenza rates and associated hospitalizations, children ages 47 and under may not visit patients in [facility] hospitals.  If the patient needs to stay at the hospital during part of their recovery, the visitor guidelines for inpatient rooms apply. Pre-op nurse will coordinate an appropriate time for 1 support person to accompany patient in pre-op.  This support person may not rotate.    Please refer to the [facility] website for the visitor guidelines for Inpatients (after your surgery is over and you are in a regular room).       Your procedure is scheduled on:  08/18/2023    Report to [facility] Main Entrance    Report to admitting at AM   Call this number if you have problems the morning of surgery [phone]   Do not eat food :After Midnight.   After Midnight you may have the following liquids until ___  0430__ AM  DAY OF SURGERY  Water Non-Citrus Juices (without pulp, NO RED-Apple, White grape, White cranberry) Black Coffee (NO MILK/CREAM OR CREAMERS, sugar ok)  Clear Tea (NO MILK/CREAM OR CREAMERS, sugar ok) regular and decaf                             Plain Jell-O (NO RED)                                           Fruit ices (not with fruit pulp, NO RED)                                     Popsicles (NO RED)                                                               Sports drinks like Gatorade (NO RED)                    The day of surgery:  Drink ONE (1) Pre-Surgery Clear Ensure or G2 at  0430 AM  ( have completed by ) the morning of surgery. Drink in one sitting. Do not sip.  This drink was given to you during your hospital  pre-op appointment visit. Nothing else to drink  after completing the  Pre-Surgery Clear Ensure or G2.          If you have questions, please contact your surgeon's office.       Oral Hygiene is also important to reduce your risk of infection.                                    Remember - BRUSH YOUR TEETH THE MORNING OF SURGERY WITH  YOUR REGULAR TOOTHPASTE  DENTURES WILL BE REMOVED PRIOR TO SURGERY PLEASE DO NOT APPLY "Poly grip" OR ADHESIVES!!!   Do NOT smoke after Midnight   Stop all vitamins and herbal supplements 7 days before surgery.   Take these medicines the morning of surgery with A SIP OF WATER:  zyrtec   DO NOT TAKE ANY ORAL DIABETIC MEDICATIONS DAY OF YOUR SURGERY  Bring CPAP mask and tubing day of surgery.                              You may not have any metal on your body including hair pins, jewelry, and body piercing             Do not wear make-up, lotions, powders, perfumes/cologne, or deodorant  Do not wear nail polish including gel and S&S, artificial/acrylic nails, or any other type of covering on natural nails including finger and toenails. If you have artificial nails, gel coating, etc. that needs to be removed by a nail salon please have this removed prior to surgery or surgery may need to be canceled/ delayed if the surgeon/ anesthesia feels like they are unable to be safely monitored.   Do not shave  48 hours prior to surgery.               Men may shave face and neck.   Do not bring valuables to the hospital. [facility] IS NOT             RESPONSIBLE   FOR VALUABLES.   Contacts, glasses, dentures or bridgework may not be worn into surgery.   Bring small overnight bag day of surgery.   DO NOT BRING YOUR HOME MEDICATIONS TO THE HOSPITAL. PHARMACY WILL DISPENSE MEDICATIONS LISTED ON YOUR MEDICATION LIST TO YOU DURING YOUR ADMISSION IN THE HOSPITAL!    Patients discharged on the day of surgery will not be allowed to drive home.  Someone NEEDS to stay with you for the first 24 hours after  anesthesia.   Special Instructions: Bring a copy of your healthcare power of attorney and living will documents the day of surgery if you haven't scanned them before.              Please read over the following fact sheets you were given: IF YOU HAVE QUESTIONS ABOUT YOUR PRE-OP INSTRUCTIONS PLEASE CALL [phone]   If you received a COVID test during your pre-op visit  it is requested that you wear a mask when out in public, stay away from anyone that may not be feeling well and notify your surgeon if you develop symptoms. If you test positive for Covid or have been in contact with anyone that has tested positive in the last 10 days please notify you surgeon.      Pre-operative 5 CHG Bath Instructions   You can play a key role in reducing the risk of infection after surgery. Your skin needs to be as free of germs as possible. You can reduce the number of germs on your skin by washing with CHG (chlorhexidine gluconate) soap before surgery. CHG is an antiseptic soap that kills germs and continues to kill germs even after washing.   DO NOT use if you have an allergy to chlorhexidine/CHG or antibacterial soaps. If your skin becomes reddened or irritated, stop using the CHG and notify one of our RNs at [phone].   Please shower with the CHG soap starting  4 days before surgery using the following schedule:     Please keep in mind the following:  DO NOT shave, including legs and underarms, starting the day of your first shower.   You may shave your face at any point before/day of surgery.  Place clean sheets on your bed the day you start using CHG soap. Use a clean washcloth (not used since being washed) for each shower. DO NOT sleep with pets once you start using the CHG.   CHG Shower Instructions:  If you choose to wash your hair and private area, wash first with your normal shampoo/soap.  After you use shampoo/soap, rinse your hair and body thoroughly to remove shampoo/soap residue.   Turn the water OFF and apply about 3 tablespoons (45 ml) of CHG soap to a CLEAN washcloth.  Apply CHG soap ONLY FROM YOUR NECK DOWN TO YOUR TOES (washing for 3-5 minutes)  DO NOT use CHG soap on face, private areas, open wounds, or sores.  Pay special attention to the area where your surgery is being performed.  If you are having back surgery, having someone wash your back for you may be helpful. Wait 2 minutes after CHG soap is applied, then you may rinse off the CHG soap.  Pat dry with a clean towel  Put on clean clothes/pajamas   If you choose to wear lotion, please use ONLY the CHG-compatible lotions on the back of this paper.     Additional instructions for the day of surgery: DO NOT APPLY any lotions, deodorants, cologne, or perfumes.   Put on clean/comfortable clothes.  Brush your teeth.  Ask your nurse before applying any prescription medications to the skin.      CHG Compatible Lotions   Aveeno Moisturizing lotion  Cetaphil Moisturizing Cream  Cetaphil Moisturizing Lotion  Clairol Herbal Essence Moisturizing Lotion, Dry Skin  Clairol Herbal Essence Moisturizing Lotion, Extra Dry Skin  Clairol Herbal Essence Moisturizing Lotion, Normal Skin  Curel Age Defying Therapeutic Moisturizing Lotion with Alpha Hydroxy  Curel Extreme Care Body Lotion  Curel Soothing Hands Moisturizing Hand Lotion  Curel Therapeutic Moisturizing Cream, Fragrance-Free  Curel Therapeutic Moisturizing Lotion, Fragrance-Free  Curel Therapeutic Moisturizing Lotion, Original Formula  Eucerin Daily Replenishing Lotion  Eucerin Dry Skin Therapy Plus Alpha Hydroxy Crme  Eucerin Dry Skin Therapy Plus Alpha Hydroxy Lotion  Eucerin Original Crme  Eucerin Original Lotion  Eucerin Plus Crme Eucerin Plus Lotion  Eucerin TriLipid Replenishing Lotion  Keri Anti-Bacterial Hand Lotion  Keri Deep Conditioning Original Lotion Dry Skin Formula Softly Scented  Keri Deep Conditioning Original Lotion, Fragrance  Free Sensitive Skin Formula  Keri Lotion Fast Absorbing Fragrance Free Sensitive Skin Formula  Keri Lotion Fast Absorbing Softly Scented Dry Skin Formula  Keri Original Lotion  Keri Skin Renewal Lotion Keri Silky Smooth Lotion  Keri Silky Smooth Sensitive Skin Lotion  Nivea Body Creamy Conditioning Oil  Nivea Body Extra Enriched Teacher, adult education Moisturizing Lotion Nivea Crme  Nivea Skin Firming Lotion  NutraDerm 30 Skin Lotion  NutraDerm Skin Lotion  NutraDerm Therapeutic Skin Cream  NutraDerm Therapeutic Skin Lotion  ProShield Protective Hand Cream  Provon moisturizing lotion

## 2023-08-08 NOTE — Progress Notes (Addendum)
Anesthesia Review:  PCP: Ladora Daniel- LOV 05/15/23  Cardiologist : none Chest x-ray : EKG : 03/29/23- Care Everywhere - REquested from  by fax.  REquested EKg for ssecond time on 08/10/23. EKG on chart.   Echo : Stress test: Cardiac Cath :  Activity level: can do a fligh tof stairs without difficulty  Sleep Study/ CPAP : none  Fasting Blood Sugar :      / Checks Blood Sugar -- times a day:   Blood Thinner/ Instructions /Last Dose: ASA / Instructions/ Last Dose :    Right knee 03/2023    Nickel and Latex Allergy    At preop appt pt was unsure of what blood pressure meds she is taking and when . Will call pt once she arrives home to review meds and inform her what she needs to take am of surgery. Called pt and LVMM on 08/09/23 and informed pt that I would call her tomorrow.  Attempted to call pt on 08/10/23 and LVMM Will attempt to call on next working day.

## 2023-08-09 ENCOUNTER — Other Ambulatory Visit: Payer: Self-pay

## 2023-08-09 ENCOUNTER — Encounter (HOSPITAL_COMMUNITY): Payer: Self-pay

## 2023-08-09 ENCOUNTER — Encounter (HOSPITAL_COMMUNITY)
Admission: RE | Admit: 2023-08-09 | Discharge: 2023-08-09 | Disposition: A | Discharge status: Home / Self Care | Payer: Medicare HMO | Source: Ambulatory Visit | Attending: Orthopedic Surgery | Admitting: Orthopedic Surgery

## 2023-08-09 VITALS — BP 150/79 | HR 61 | Temp 97.9°F | Resp 16 | Ht 65.0 in | Wt 182.0 lb

## 2023-08-09 DIAGNOSIS — Z01818 Encounter for other preprocedural examination: Secondary | ICD-10-CM

## 2023-08-09 DIAGNOSIS — Z01812 Encounter for preprocedural laboratory examination: Secondary | ICD-10-CM | POA: Insufficient documentation

## 2023-08-09 LAB — BASIC METABOLIC PANEL
Anion gap: 7 (ref 5–15)
BUN: 13 mg/dL (ref 8–23)
CO2: 26 mmol/L (ref 22–32)
Calcium: 9.6 mg/dL (ref 8.9–10.3)
Chloride: 106 mmol/L (ref 98–111)
Creatinine, Ser: 0.65 mg/dL (ref 0.44–1.00)
GFR, Estimated: >60 mL/min (ref >60)
Glucose, Bld: 101 mg/dL — ABNORMAL HIGH (ref 70–99)
Potassium: 4 mmol/L (ref 3.5–5.1)
Sodium: 139 mmol/L (ref 135–145)

## 2023-08-09 LAB — CBC
HCT: 40.6 % (ref 36.0–46.0)
Hemoglobin: 13.2 g/dL (ref 12.0–15.0)
MCH: 30 pg (ref 26.0–34.0)
MCHC: 32.5 g/dL (ref 30.0–36.0)
MCV: 92.3 fL (ref 80.0–100.0)
Platelets: 247 10*3/uL (ref 150–400)
RBC: 4.4 MIL/uL (ref 3.87–5.11)
RDW: 13.4 % (ref 11.5–15.5)
WBC: 4.7 10*3/uL (ref 4.0–10.5)
nRBC: 0 % (ref 0.0–0.2)

## 2023-08-09 LAB — SURGICAL PCR SCREEN
MRSA, PCR: NEGATIVE
Staphylococcus aureus: NEGATIVE

## 2023-08-16 ENCOUNTER — Encounter (HOSPITAL_COMMUNITY): Payer: Self-pay | Admitting: Orthopedic Surgery

## 2023-08-16 NOTE — Progress Notes (Signed)
Called and spoke with pt on 08/16/2023.  PT confirmed that she takes Olmesartan in am and that is only blood pressure med she takes.  Instructed pt not to  take am of surgery.  PT voiced understanding.  Pt stated she will take her TBalance for thyroid am of surgery just like she did for surgery in June, 2024.

## 2023-08-16 NOTE — Anesthesia Preprocedure Evaluation (Addendum)
Anesthesia Evaluation  Patient identified by MRN, date of birth, ID band Patient awake    Reviewed: Allergy & Precautions, NPO status , Patient's Chart, lab work & pertinent test results  Airway Mallampati: II  TM Distance: >3 FB Neck ROM: Full    Dental no notable dental hx. (+) Upper Dentures, Dental Advisory Given   Pulmonary former smoker   Pulmonary exam normal breath sounds clear to auscultation       Cardiovascular hypertension, Normal cardiovascular exam Rhythm:Regular Rate:Normal     Neuro/Psych  Neuromuscular disease  negative psych ROS   GI/Hepatic negative GI ROS, Neg liver ROS,,,  Endo/Other  Hypothyroidism  Hyperlipidemia  Renal/GU Hx/o renal calculi  negative genitourinary   Musculoskeletal  (+) Arthritis , Osteoarthritis,  Fibromyalgia -DDD lumbar and cervical spine OA left knee   Abdominal   Peds  Hematology negative hematology ROS (+)   Anesthesia Other Findings All: Latex,  see list  Reproductive/Obstetrics                             Anesthesia Physical Anesthesia Plan  ASA: 2  Anesthesia Plan: Spinal and Regional   Post-op Pain Management: Precedex, Regional block* and Minimal or no pain anticipated   Induction: Intravenous  PONV Risk Score and Plan: 3 and Treatment may vary due to age or medical condition and Propofol infusion  Airway Management Planned: Natural Airway and Simple Face Mask  Additional Equipment: None  Intra-op Plan:   Post-operative Plan:   Informed Consent: I have reviewed the patients History and Physical, chart, labs and discussed the procedure including the risks, benefits and alternatives for the proposed anesthesia with the patient or authorized representative who has indicated his/her understanding and acceptance.     Dental advisory given  Plan Discussed with: CRNA and Anesthesiologist  Anesthesia Plan Comments: (Spinal w L  Adductor canal)        Anesthesia Quick Evaluation

## 2023-08-18 ENCOUNTER — Encounter (HOSPITAL_COMMUNITY)
Admission: RE | Disposition: A | Discharge status: Home / Self Care | Payer: Self-pay | Source: Ambulatory Visit | Attending: Orthopedic Surgery

## 2023-08-18 ENCOUNTER — Encounter (HOSPITAL_COMMUNITY): Payer: Self-pay | Admitting: Orthopedic Surgery

## 2023-08-18 ENCOUNTER — Other Ambulatory Visit: Payer: Self-pay

## 2023-08-18 ENCOUNTER — Ambulatory Visit (HOSPITAL_COMMUNITY): Payer: Medicare HMO | Admitting: Physician Assistant

## 2023-08-18 ENCOUNTER — Ambulatory Visit (HOSPITAL_COMMUNITY): Payer: Medicare HMO | Admitting: Anesthesiology

## 2023-08-18 ENCOUNTER — Other Ambulatory Visit (HOSPITAL_COMMUNITY): Payer: Self-pay

## 2023-08-18 ENCOUNTER — Observation Stay (HOSPITAL_COMMUNITY)
Admission: RE | Admit: 2023-08-18 | Discharge: 2023-08-21 | Disposition: A | Discharge status: Home / Self Care | Payer: Medicare HMO | Source: Ambulatory Visit | Attending: Orthopedic Surgery | Admitting: Orthopedic Surgery

## 2023-08-18 DIAGNOSIS — Z87891 Personal history of nicotine dependence: Secondary | ICD-10-CM | POA: Insufficient documentation

## 2023-08-18 DIAGNOSIS — I10 Essential (primary) hypertension: Secondary | ICD-10-CM | POA: Insufficient documentation

## 2023-08-18 DIAGNOSIS — M1712 Unilateral primary osteoarthritis, left knee: Secondary | ICD-10-CM

## 2023-08-18 DIAGNOSIS — Z79899 Other long term (current) drug therapy: Secondary | ICD-10-CM | POA: Diagnosis not present

## 2023-08-18 DIAGNOSIS — E039 Hypothyroidism, unspecified: Secondary | ICD-10-CM | POA: Insufficient documentation

## 2023-08-18 DIAGNOSIS — Z96651 Presence of right artificial knee joint: Secondary | ICD-10-CM | POA: Insufficient documentation

## 2023-08-18 DIAGNOSIS — Z7982 Long term (current) use of aspirin: Secondary | ICD-10-CM | POA: Insufficient documentation

## 2023-08-18 DIAGNOSIS — Z91040 Latex allergy status: Secondary | ICD-10-CM | POA: Insufficient documentation

## 2023-08-18 DIAGNOSIS — Z96652 Presence of left artificial knee joint: Principal | ICD-10-CM

## 2023-08-18 DIAGNOSIS — Z01818 Encounter for other preprocedural examination: Secondary | ICD-10-CM

## 2023-08-18 HISTORY — PX: TOTAL KNEE ARTHROPLASTY: SHX125

## 2023-08-18 SURGERY — ARTHROPLASTY, KNEE, TOTAL
Anesthesia: Regional | Site: Knee | Laterality: Left

## 2023-08-18 MED ORDER — HYDROMORPHONE HCL 1 MG/ML IJ SOLN
0.5000 mg | INTRAMUSCULAR | Status: DC | PRN
  Filled 2023-08-18: qty 1

## 2023-08-18 MED ORDER — ORAL CARE MOUTH RINSE: 15.0000 mL | Freq: Once | OROMUCOSAL | Status: AC

## 2023-08-18 MED ORDER — DEXAMETHASONE SODIUM PHOSPHATE 10 MG/ML IJ SOLN
INTRAMUSCULAR | Status: DC | PRN
  Administered 2023-08-18: 5 mg via INTRAVENOUS

## 2023-08-18 MED ORDER — ONDANSETRON HCL 4 MG/2ML IJ SOLN: 4.0000 mg | Freq: Once | INTRAMUSCULAR | Status: DC | PRN

## 2023-08-18 MED ORDER — RISAQUAD PO CAPS
1.0000 | ORAL_CAPSULE | Freq: Every day | ORAL | Status: DC
  Administered 2023-08-19 – 2023-08-21 (×3): 1 via ORAL
  Filled 2023-08-18 (×3): qty 1

## 2023-08-18 MED ORDER — LEVOFLOXACIN 500 MG PO TABS: 500.0000 mg | ORAL_TABLET | Freq: Every day | ORAL | Status: DC

## 2023-08-18 MED ORDER — DEXAMETHASONE SODIUM PHOSPHATE 10 MG/ML IJ SOLN
INTRAMUSCULAR | Status: AC
  Filled 2023-08-18: qty 1

## 2023-08-18 MED ORDER — TRIAMCINOLONE ACETONIDE 0.1 % EX CREA: 1.0000 | TOPICAL_CREAM | Freq: Two times a day (BID) | CUTANEOUS | Status: DC | PRN

## 2023-08-18 MED ORDER — TRANEXAMIC ACID-NACL 1000-0.7 MG/100ML-% IV SOLN
1000.0000 mg | Freq: Once | INTRAVENOUS | Status: AC
  Administered 2023-08-18: 1000 mg via INTRAVENOUS
  Filled 2023-08-18: qty 100

## 2023-08-18 MED ORDER — AZELASTINE HCL 0.1 % NA SOLN: 1.0000 | Freq: Two times a day (BID) | NASAL | Status: DC | PRN

## 2023-08-18 MED ORDER — SODIUM CHLORIDE 0.9 % IR SOLN
Status: DC | PRN
  Administered 2023-08-18: 1000 mL

## 2023-08-18 MED ORDER — EPHEDRINE SULFATE-NACL 50-0.9 MG/10ML-% IV SOSY
PREFILLED_SYRINGE | INTRAVENOUS | Status: DC | PRN
  Administered 2023-08-18: 10 mg via INTRAVENOUS
  Administered 2023-08-18: 5 mg via INTRAVENOUS

## 2023-08-18 MED ORDER — BUPIVACAINE LIPOSOME 1.3 % IJ SUSP
INTRAMUSCULAR | Status: DC | PRN
  Administered 2023-08-18: 30 mL

## 2023-08-18 MED ORDER — SODIUM CHLORIDE (PF) 0.9 % IJ SOLN
INTRAMUSCULAR | Status: AC
  Filled 2023-08-18: qty 50

## 2023-08-18 MED ORDER — CHOLECALCIFEROL 10 MCG (400 UNIT) PO TABS
400.0000 [IU] | ORAL_TABLET | Freq: Every day | ORAL | Status: DC
  Administered 2023-08-19 – 2023-08-21 (×3): 400 [IU] via ORAL
  Filled 2023-08-18 (×3): qty 1

## 2023-08-18 MED ORDER — METHOCARBAMOL 500 MG IVPB - SIMPLE MED
INTRAVENOUS | Status: AC
  Filled 2023-08-18: qty 55

## 2023-08-18 MED ORDER — BUPIVACAINE IN DEXTROSE 0.75-8.25 % IT SOLN
INTRATHECAL | Status: DC | PRN
  Administered 2023-08-18: 1.8 mL via INTRATHECAL

## 2023-08-18 MED ORDER — LACTATED RINGERS IV SOLN: INTRAVENOUS | Status: DC

## 2023-08-18 MED ORDER — PROPOFOL 500 MG/50ML IV EMUL
INTRAVENOUS | Status: AC
  Filled 2023-08-18: qty 50

## 2023-08-18 MED ORDER — FENTANYL CITRATE (PF) 250 MCG/5ML IJ SOLN
INTRAMUSCULAR | Status: AC
  Filled 2023-08-18: qty 5

## 2023-08-18 MED ORDER — BUPIVACAINE-EPINEPHRINE 0.25% -1:200000 IJ SOLN
INTRAMUSCULAR | Status: DC | PRN
  Administered 2023-08-18: 30 mL

## 2023-08-18 MED ORDER — DOCUSATE SODIUM 100 MG PO CAPS
100.0000 mg | ORAL_CAPSULE | Freq: Two times a day (BID) | ORAL | Status: DC
  Administered 2023-08-18 – 2023-08-21 (×6): 100 mg via ORAL
  Filled 2023-08-18 (×6): qty 1

## 2023-08-18 MED ORDER — MIDAZOLAM HCL 2 MG/2ML IJ SOLN: 2.0000 mg | INTRAMUSCULAR | Status: DC

## 2023-08-18 MED ORDER — BENZONATATE 100 MG PO CAPS: 200.0000 mg | ORAL_CAPSULE | Freq: Two times a day (BID) | ORAL | Status: DC | PRN

## 2023-08-18 MED ORDER — OXYCODONE HCL 5 MG PO TABS
5.0000 mg | ORAL_TABLET | ORAL | Status: DC | PRN
  Administered 2023-08-18 (×2): 5 mg via ORAL
  Administered 2023-08-19 (×3): 10 mg via ORAL
  Administered 2023-08-19: 5 mg via ORAL
  Administered 2023-08-20 – 2023-08-21 (×6): 10 mg via ORAL
  Filled 2023-08-18 (×4): qty 2
  Filled 2023-08-18: qty 1
  Filled 2023-08-18 (×4): qty 2
  Filled 2023-08-18 (×2): qty 1
  Filled 2023-08-18: qty 2

## 2023-08-18 MED ORDER — PROPOFOL 500 MG/50ML IV EMUL
INTRAVENOUS | Status: DC | PRN
  Administered 2023-08-18: 75 ug/kg/min via INTRAVENOUS

## 2023-08-18 MED ORDER — BUPIVACAINE LIPOSOME 1.3 % IJ SUSP
INTRAMUSCULAR | Status: AC
  Filled 2023-08-18: qty 20

## 2023-08-18 MED ORDER — ASPIRIN 81 MG PO CHEW
81.0000 mg | CHEWABLE_TABLET | Freq: Two times a day (BID) | ORAL | 0 refills | Status: AC
  Filled 2023-08-18: qty 60, 30d supply, fill #0

## 2023-08-18 MED ORDER — LORATADINE 10 MG PO TABS
10.0000 mg | ORAL_TABLET | Freq: Every day | ORAL | Status: DC
  Administered 2023-08-19 – 2023-08-21 (×3): 10 mg via ORAL
  Filled 2023-08-18 (×3): qty 1

## 2023-08-18 MED ORDER — ONDANSETRON HCL 4 MG/2ML IJ SOLN
INTRAMUSCULAR | Status: DC | PRN
  Administered 2023-08-18: 4 mg via INTRAVENOUS

## 2023-08-18 MED ORDER — EPHEDRINE 5 MG/ML INJ
INTRAVENOUS | Status: AC
  Filled 2023-08-18: qty 5

## 2023-08-18 MED ORDER — TRANEXAMIC ACID-NACL 1000-0.7 MG/100ML-% IV SOLN
1000.0000 mg | INTRAVENOUS | Status: AC
  Administered 2023-08-18: 1000 mg via INTRAVENOUS
  Filled 2023-08-18: qty 100

## 2023-08-18 MED ORDER — ONDANSETRON HCL 4 MG PO TABS: 4.0000 mg | ORAL_TABLET | Freq: Four times a day (QID) | ORAL | Status: DC | PRN

## 2023-08-18 MED ORDER — VANCOMYCIN HCL IN DEXTROSE 1-5 GM/200ML-% IV SOLN
1000.0000 mg | Freq: Two times a day (BID) | INTRAVENOUS | Status: AC
  Administered 2023-08-18: 1000 mg via INTRAVENOUS
  Filled 2023-08-18: qty 200

## 2023-08-18 MED ORDER — MENTHOL 3 MG MT LOZG: 1.0000 | LOZENGE | OROMUCOSAL | Status: DC | PRN

## 2023-08-18 MED ORDER — CHLORHEXIDINE GLUCONATE 0.12 % MT SOLN
15.0000 mL | Freq: Once | OROMUCOSAL | Status: AC
  Administered 2023-08-18: 15 mL via OROMUCOSAL

## 2023-08-18 MED ORDER — METHOCARBAMOL 500 MG PO TABS
500.0000 mg | ORAL_TABLET | Freq: Three times a day (TID) | ORAL | 1 refills | Status: AC | PRN
Start: 2023-08-18 — End: ?
  Filled 2023-08-18: qty 40, 14d supply, fill #0

## 2023-08-18 MED ORDER — ACETAMINOPHEN 325 MG PO TABS
325.0000 mg | ORAL_TABLET | Freq: Four times a day (QID) | ORAL | Status: DC | PRN
  Administered 2023-08-18 – 2023-08-20 (×4): 650 mg via ORAL
  Filled 2023-08-18 (×4): qty 2

## 2023-08-18 MED ORDER — CLONIDINE HCL (ANALGESIA) 100 MCG/ML EP SOLN
EPIDURAL | Status: DC | PRN
Start: 2023-08-18 — End: 2023-08-18
  Administered 2023-08-18: 100 ug

## 2023-08-18 MED ORDER — METHOCARBAMOL 500 MG PO TABS
500.0000 mg | ORAL_TABLET | Freq: Four times a day (QID) | ORAL | Status: DC | PRN
  Administered 2023-08-18 – 2023-08-21 (×4): 500 mg via ORAL
  Filled 2023-08-18 (×5): qty 1

## 2023-08-18 MED ORDER — LIDOCAINE 2% (20 MG/ML) 5 ML SYRINGE
INTRAMUSCULAR | Status: DC | PRN
Start: 2023-08-18 — End: 2023-08-18
  Administered 2023-08-18: 60 mg via INTRAVENOUS

## 2023-08-18 MED ORDER — METHOCARBAMOL 500 MG IVPB - SIMPLE MED
500.0000 mg | Freq: Four times a day (QID) | INTRAVENOUS | Status: DC | PRN
  Administered 2023-08-18: 500 mg via INTRAVENOUS

## 2023-08-18 MED ORDER — ONDANSETRON HCL 4 MG/2ML IJ SOLN: 4.0000 mg | Freq: Four times a day (QID) | INTRAMUSCULAR | Status: DC | PRN

## 2023-08-18 MED ORDER — ASPIRIN 81 MG PO CHEW
81.0000 mg | CHEWABLE_TABLET | Freq: Two times a day (BID) | ORAL | Status: DC
  Administered 2023-08-18 – 2023-08-21 (×6): 81 mg via ORAL
  Filled 2023-08-18 (×6): qty 1

## 2023-08-18 MED ORDER — METOCLOPRAMIDE HCL 5 MG/ML IJ SOLN: 5.0000 mg | Freq: Three times a day (TID) | INTRAMUSCULAR | Status: DC | PRN

## 2023-08-18 MED ORDER — PHENOL 1.4 % MT LIQD: 1.0000 | OROMUCOSAL | Status: DC | PRN

## 2023-08-18 MED ORDER — FENTANYL CITRATE PF 50 MCG/ML IJ SOSY: 25.0000 ug | PREFILLED_SYRINGE | INTRAMUSCULAR | Status: DC | PRN

## 2023-08-18 MED ORDER — METOCLOPRAMIDE HCL 5 MG PO TABS: 5.0000 mg | ORAL_TABLET | Freq: Three times a day (TID) | ORAL | Status: DC | PRN

## 2023-08-18 MED ORDER — PROBIOTIC ACIDOPHILUS PO CAPS: 1.0000 | ORAL_CAPSULE | Freq: Every day | ORAL | Status: DC

## 2023-08-18 MED ORDER — 0.9 % SODIUM CHLORIDE (POUR BTL) OPTIME
TOPICAL | Status: DC | PRN
  Administered 2023-08-18: 1000 mL

## 2023-08-18 MED ORDER — IRBESARTAN 150 MG PO TABS
300.0000 mg | ORAL_TABLET | Freq: Every day | ORAL | Status: DC
  Administered 2023-08-19 – 2023-08-21 (×3): 300 mg via ORAL
  Filled 2023-08-18 (×3): qty 2

## 2023-08-18 MED ORDER — HYDROCORTISONE 1 % EX CREA: TOPICAL_CREAM | Freq: Three times a day (TID) | CUTANEOUS | Status: DC

## 2023-08-18 MED ORDER — SODIUM CHLORIDE (PF) 0.9 % IJ SOLN
INTRAMUSCULAR | Status: DC | PRN
  Administered 2023-08-18: 30 mL

## 2023-08-18 MED ORDER — POLYETHYLENE GLYCOL 3350 17 G PO PACK: 17.0000 g | PACK | Freq: Every day | ORAL | Status: DC | PRN

## 2023-08-18 MED ORDER — ACETAMINOPHEN 10 MG/ML IV SOLN: 1000.0000 mg | Freq: Once | INTRAVENOUS | Status: DC | PRN

## 2023-08-18 MED ORDER — OXYCODONE HCL 5 MG PO TABS
5.0000 mg | ORAL_TABLET | ORAL | 0 refills | Status: AC | PRN
  Filled 2023-08-18: qty 30, 5d supply, fill #0

## 2023-08-18 MED ORDER — ONDANSETRON HCL 4 MG PO TABS: 4.0000 mg | ORAL_TABLET | Freq: Three times a day (TID) | ORAL | Status: DC | PRN

## 2023-08-18 MED ORDER — VANCOMYCIN HCL IN DEXTROSE 1-5 GM/200ML-% IV SOLN
1000.0000 mg | INTRAVENOUS | Status: AC
  Administered 2023-08-18: 1000 mg via INTRAVENOUS
  Filled 2023-08-18: qty 200

## 2023-08-18 MED ORDER — PHENYLEPHRINE HCL-NACL 20-0.9 MG/250ML-% IV SOLN
INTRAVENOUS | Status: AC
  Filled 2023-08-18: qty 250

## 2023-08-18 MED ORDER — SODIUM CHLORIDE 0.9 % IV SOLN: INTRAVENOUS | Status: AC

## 2023-08-18 MED ORDER — PROPOFOL 10 MG/ML IV BOLUS
INTRAVENOUS | Status: DC | PRN
  Administered 2023-08-18: 20 mg via INTRAVENOUS
  Administered 2023-08-18: 180 mg via INTRAVENOUS
  Administered 2023-08-18: 20 mg via INTRAVENOUS
  Administered 2023-08-18: 10 mg via INTRAVENOUS

## 2023-08-18 MED ORDER — POVIDONE-IODINE 10 % EX SWAB: 2.0000 | Freq: Once | CUTANEOUS | Status: DC

## 2023-08-18 MED ORDER — BUPIVACAINE-EPINEPHRINE 0.25% -1:200000 IJ SOLN
INTRAMUSCULAR | Status: AC
  Filled 2023-08-18: qty 1

## 2023-08-18 MED ORDER — FENTANYL CITRATE (PF) 100 MCG/2ML IJ SOLN
INTRAMUSCULAR | Status: DC | PRN
  Administered 2023-08-18: 100 ug via INTRAVENOUS
  Administered 2023-08-18 (×2): 50 ug via INTRAVENOUS

## 2023-08-18 MED ORDER — FENTANYL CITRATE PF 50 MCG/ML IJ SOSY
100.0000 ug | PREFILLED_SYRINGE | INTRAMUSCULAR | Status: AC
  Administered 2023-08-18: 50 ug via INTRAVENOUS
  Filled 2023-08-18: qty 2

## 2023-08-18 MED ORDER — AMLODIPINE BESYLATE 10 MG PO TABS: 10.0000 mg | ORAL_TABLET | Freq: Every day | ORAL | Status: DC

## 2023-08-18 MED ORDER — BUPIVACAINE LIPOSOME 1.3 % IJ SUSP: 20.0000 mL | Freq: Once | INTRAMUSCULAR | Status: DC

## 2023-08-18 MED ORDER — ROPIVACAINE HCL 5 MG/ML IJ SOLN
INTRAMUSCULAR | Status: DC | PRN
Start: 2023-08-18 — End: 2023-08-18
  Administered 2023-08-18: 30 mL via PERINEURAL

## 2023-08-18 MED ORDER — WATER FOR IRRIGATION, STERILE IR SOLN
Status: DC | PRN
  Administered 2023-08-18: 1000 mL

## 2023-08-18 MED ORDER — LEVOFLOXACIN IN D5W 500 MG/100ML IV SOLN
500.0000 mg | INTRAVENOUS | Status: DC
  Filled 2023-08-18: qty 100

## 2023-08-18 MED ORDER — ONDANSETRON HCL 4 MG/2ML IJ SOLN
INTRAMUSCULAR | Status: AC
  Filled 2023-08-18: qty 2

## 2023-08-18 MED ORDER — BISACODYL 10 MG RE SUPP: 10.0000 mg | Freq: Every day | RECTAL | Status: DC | PRN

## 2023-08-18 SURGICAL SUPPLY — 59 items
ATTUNE MED DOME PAT 38 KNEE (Knees) IMPLANT
ATTUNE PS FEM LT SZ 7 CEM KNEE (Femur) IMPLANT
ATTUNE PSRP INSR SZ7 10 KNEE (Insert) IMPLANT
BAG COUNTER SPONGE SURGICOUNT (BAG) IMPLANT
BAG SPEC THK2 15X12 ZIP CLS (MISCELLANEOUS)
BAG SPNG CNTER NS LX DISP (BAG)
BAG ZIPLOCK 12X15 (MISCELLANEOUS) IMPLANT
BASE TIBIAL ROT PLAT SZ 7 KNEE (Knees) IMPLANT
BLADE SAG 18X100X1.27 (BLADE) ×1 IMPLANT
BLADE SAW SGTL 13X75X1.27 (BLADE) ×1 IMPLANT
BNDG CMPR MED 10X6 ELC LF (GAUZE/BANDAGES/DRESSINGS) ×1
BNDG CMPR MED 15X6 ELC VLCR LF (GAUZE/BANDAGES/DRESSINGS) ×1
BNDG ELASTIC 6X10 VLCR STRL LF (GAUZE/BANDAGES/DRESSINGS) ×1 IMPLANT
BNDG ELASTIC 6X15 VLCR STRL LF (GAUZE/BANDAGES/DRESSINGS) IMPLANT
BNDG GAUZE DERMACEA FLUFF 4 (GAUZE/BANDAGES/DRESSINGS) ×1 IMPLANT
BNDG GZE DERMACEA 4 6PLY (GAUZE/BANDAGES/DRESSINGS) ×1
BOWL SMART MIX CTS (DISPOSABLE) ×1 IMPLANT
BSPLAT TIB 7 CMNT ROT PLAT STR (Knees) ×1 IMPLANT
CEMENT HV SMART SET (Cement) ×2 IMPLANT
COVER SURGICAL LIGHT HANDLE (MISCELLANEOUS) ×1 IMPLANT
CUFF TOURN SGL QUICK 34 (TOURNIQUET CUFF) ×1
CUFF TRNQT CYL 34X4.125X (TOURNIQUET CUFF) ×1 IMPLANT
DRAPE INCISE IOBAN 66X45 STRL (DRAPES) IMPLANT
DRAPE SHEET LG 3/4 BI-LAMINATE (DRAPES) ×1 IMPLANT
DRAPE U-SHAPE 47X51 STRL (DRAPES) ×1 IMPLANT
DRSG ADAPTIC 3X8 NADH LF (GAUZE/BANDAGES/DRESSINGS) ×1 IMPLANT
DRSG AQUACEL AG ADV 3.5X10 (GAUZE/BANDAGES/DRESSINGS) IMPLANT
DURAPREP 26ML APPLICATOR (WOUND CARE) ×1 IMPLANT
ELECT REM PT RETURN 15FT ADLT (MISCELLANEOUS) ×1 IMPLANT
GAUZE PAD ABD 7.5X8 STRL (GAUZE/BANDAGES/DRESSINGS) IMPLANT
GAUZE PAD ABD 8X10 STRL (GAUZE/BANDAGES/DRESSINGS) ×1 IMPLANT
GAUZE SPONGE 4X4 12PLY STRL (GAUZE/BANDAGES/DRESSINGS) ×1 IMPLANT
GLOVE BIOGEL PI IND STRL 7.5 (GLOVE) ×1 IMPLANT
GLOVE BIOGEL PI IND STRL 8.5 (GLOVE) ×1 IMPLANT
GLOVE ORTHO TXT STRL SZ7.5 (GLOVE) ×1 IMPLANT
GLOVE SURG ORTHO 8.5 STRL (GLOVE) ×1 IMPLANT
GOWN STRL REUS W/ TWL XL LVL3 (GOWN DISPOSABLE) ×2 IMPLANT
GOWN STRL REUS W/TWL XL LVL3 (GOWN DISPOSABLE) ×2
HANDPIECE INTERPULSE COAX TIP (DISPOSABLE) ×1
HOLDER FOLEY CATH W/STRAP (MISCELLANEOUS) IMPLANT
IMMOBILIZER KNEE 20 (SOFTGOODS) ×1
IMMOBILIZER KNEE 20 THIGH 36 (SOFTGOODS) IMPLANT
KIT TURNOVER KIT A (KITS) IMPLANT
MANIFOLD NEPTUNE II (INSTRUMENTS) ×1 IMPLANT
NS IRRIG 1000ML POUR BTL (IV SOLUTION) ×1 IMPLANT
PACK TOTAL KNEE CUSTOM (KITS) ×1 IMPLANT
PIN STEINMAN FIXATION KNEE (PIN) IMPLANT
PROTECTOR NERVE ULNAR (MISCELLANEOUS) ×1 IMPLANT
SET HNDPC FAN SPRY TIP SCT (DISPOSABLE) ×1 IMPLANT
STRIP CLOSURE SKIN 1/2X4 (GAUZE/BANDAGES/DRESSINGS) ×2 IMPLANT
SUT MNCRL AB 3-0 PS2 18 (SUTURE) ×1 IMPLANT
SUT VIC AB 0 CT1 36 (SUTURE) ×1 IMPLANT
SUT VIC AB 1 CT1 36 (SUTURE) ×2 IMPLANT
SUT VIC AB 2-0 CT1 27 (SUTURE) ×1
SUT VIC AB 2-0 CT1 TAPERPNT 27 (SUTURE) ×1 IMPLANT
TIBIAL BASE ROT PLAT SZ 7 KNEE (Knees) ×1 IMPLANT
TRAY CATH INTERMITTENT SS 16FR (CATHETERS) ×1 IMPLANT
WATER STERILE IRR 1000ML POUR (IV SOLUTION) ×2 IMPLANT
YANKAUER SUCT BULB TIP NO VENT (SUCTIONS) ×1 IMPLANT

## 2023-08-18 NOTE — Evaluation (Signed)
Physical Therapy Evaluation Patient Details Name: Jenny Krueger MRN: [id number] DOB: 1946/05/26 Today's Date: 08/18/2023  History of Present Illness  77 yo female presents to therapy s/p L TKA on 08/18/2023 due to failure of conservative measures. 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  Clinical Impression     Jenny Krueger is a 77 y.o. female POD 0 s/p L TKA. Patient reports mod I with mobility at baseline. Patient is now limited by functional impairments (see PT problem list below) and requires CGA for bed mobility and min A for transfers. Patient was able to ambulate 15 and 12 feet with RW and CGA level of assist. Patient instructed in exercise to facilitate ROM and circulation to manage edema. Patient will benefit from continued skilled PT interventions to address impairments and progress towards PLOF. Acute PT will follow to progress mobility and stair training in preparation for safe discharge home with family support and OPPT services.       If plan is discharge home, recommend the following: A little help with walking and/or transfers;A little help with bathing/dressing/bathroom;Assistance with cooking/housework;Help with stairs or ramp for entrance;Assist for transportation   Can travel by private vehicle        Equipment Recommendations None recommended by PT  Recommendations for Other Services       Functional Status Assessment Patient has had a recent decline in their functional status and demonstrates the ability to make significant improvements in function in a reasonable and predictable amount of time.     Precautions / Restrictions Precautions Precautions: Knee;Fall Restrictions Weight Bearing Restrictions: No      Mobility  Bed Mobility Overal bed mobility: Needs Assistance Bed Mobility: Supine to Sit     Supine to sit: Used rails, HOB elevated, Contact guard     General bed mobility  comments: min cues    Transfers Overall transfer level: Needs assistance Equipment used: Rolling walker (2 wheels) Transfers: Sit to/from Stand Sit to Stand: From elevated surface, Min assist           General transfer comment: push to stand with min A from elevated EOB pt reported needing to void bladder and CGA from elevated commode steat with cues for proper UE placement    Ambulation/Gait Ambulation/Gait assistance: Contact guard assist Gait Distance (Feet): 15 Feet Assistive device: Rolling walker (2 wheels) Gait Pattern/deviations: Step-to pattern, Antalgic, Trunk flexed Gait velocity: decreased     General Gait Details: min cues for safety and approach to sitting suraces  Stairs            Wheelchair Mobility     Tilt Bed    Modified Rankin (Stroke Patients Only)       Balance Overall balance assessment: Needs assistance, History of Falls Sitting-balance support: Feet supported Sitting balance-Leahy Scale: Good     Standing balance support: Bilateral upper extremity supported, During functional activity, Reliant on assistive device for balance Standing balance-Leahy Scale: Poor                               Pertinent Vitals/Pain Pain Assessment Pain Assessment: 0-10 Pain Score: 5  Pain Location: L knee pain Pain Descriptors / Indicators: Aching, Constant, Discomfort, Operative site guarding Pain Intervention(s): Limited activity within patient's tolerance, Monitored during session, Repositioned, Ice applied (nurse reports pt declined pain medication since arriving on 3rd floor)    Home  Living Family/patient expects to be discharged to:: Private residence Living Arrangements: Children (son lives with pt currently in hospital) Available Help at Discharge: Family;Available PRN/intermittently Type of Home: Apartment Home Access: Level entry       Home Layout: One level Home Equipment: Agricultural consultant (2 wheels);BSC/3in1;Cane -  single point      Prior Function Prior Level of Function : Independent/Modified Independent             Mobility Comments: mod I at [facility] for all ADLs, self care tasks, has assist from son for IADLs and some household  management       Extremity/Trunk Assessment        Lower Extremity Assessment Lower Extremity Assessment: LLE deficits/detail LLE Deficits / Details: ankle DF/PF 5/5; SLR < 10 degree lag LLE Sensation: WNL    Cervical / Trunk Assessment Cervical / Trunk Assessment: Normal  Communication   Communication Communication: No apparent difficulties  Cognition Arousal: Alert Behavior During Therapy: WFL for tasks assessed/performed Overall Cognitive Status: Within Functional Limits for tasks assessed                                          General Comments      Exercises Total Joint Exercises Ankle Circles/Pumps: AROM, Both, 15 reps   Assessment/Plan    PT Assessment Patient needs continued PT services  PT Problem List Decreased strength;Decreased range of motion;Decreased activity tolerance;Decreased balance;Decreased mobility;Decreased coordination;Pain       PT Treatment Interventions DME instruction;Gait training;Functional mobility training;Therapeutic activities;Therapeutic exercise;Balance training;Neuromuscular re-education;Modalities    PT Goals (Current goals can be found in the Care Plan section)  Acute Rehab PT Goals Patient Stated Goal: to be able to walk without cane PT Goal Formulation: With patient Time For Goal Achievement: 09/01/23 Potential to Achieve Goals: Good    Frequency 7X/week     Co-evaluation               AM-PAC PT "6 Clicks" Mobility  Outcome Measure Help needed turning from your back to your side while in a flat bed without using bedrails?: A Little Help needed moving from lying on your back to sitting on the side of a flat bed without using bedrails?: A Little Help needed moving to and  from a bed to a chair (including a wheelchair)?: A Little Help needed standing up from a chair using your arms (e.g., wheelchair or bedside chair)?: A Little Help needed to walk in hospital room?: A Little Help needed climbing 3-5 steps with a railing? : A Lot 6 Click Score: 17    End of Session         PT Visit Diagnosis: Unsteadiness on feet (R26.81);Other abnormalities of gait and mobility (R26.89);Muscle weakness (generalized) (M62.81);History of falling (Z91.81);Pain;Difficulty in walking, not elsewhere classified (R26.2) Pain - Right/Left: Left Pain - part of body: Knee;Leg    Time: 1711-1750 PT Time Calculation (min) (ACUTE ONLY): 39 min   Charges:   PT Evaluation $PT Eval Low Complexity: 1 Low PT Treatments $Gait Training: 8-22 mins $Therapeutic Activity: 8-22 mins PT General Charges $$ ACUTE PT VISIT: 1 Visit         Johnny Bridge, PT Acute Rehab   Jacqualyn Posey 08/18/2023, 5:55 PM

## 2023-08-18 NOTE — Discharge Instructions (Signed)
Ice to the knee constantly.  Keep the incision covered and clean and dry for one week, then ok to get it wet in the shower.  Do exercise as instructed every hour, please to prevent stiffness.    DO NOT prop anything under the knee, it will make your knee stiff.  Prop under the ankle to encourage your knee to go straight.   Use the walker while you are up and around for balance.  Wear your support stockings 24/7 to prevent blood clots and take baby aspirin twice daily for 30 days also to prevent blood clots  Follow up with Dr Ranell Patrick in two weeks in the office, call [phone] for appt  Please call Dr Haakon Titsworth(cell) [phone] with any questions or concerns  INSTRUCTIONS AFTER JOINT REPLACEMENT   Remove items at home which could result in a fall. This includes throw rugs or furniture in walking pathways ICE to the affected joint every three hours while awake for 30 minutes at a time, for at least the first 3-5 days, and then as needed for pain and swelling.  Continue to use ice for pain and swelling. You may notice swelling that will progress down to the foot and ankle.  This is normal after surgery.  Elevate your leg when you are not up walking on it.   Continue to use the breathing machine you got in the hospital (incentive spirometer) which will help keep your temperature down.  It is common for your temperature to cycle up and down following surgery, especially at night when you are not up moving around and exerting yourself.  The breathing machine keeps your lungs expanded and your temperature down.   DIET:  As you were doing prior to hospitalization, we recommend a well-balanced diet.  DRESSING / WOUND CARE / SHOWERING  You may change your dressing 3-5 days after surgery.  Then change the dressing every day with sterile gauze.  Please use good hand washing techniques before changing the dressing.  Do not use any lotions or creams on the incision until instructed by your  surgeon.  ACTIVITY  Increase activity slowly as tolerated, but follow the weight bearing instructions below.   No driving for 6 weeks or until further direction given by your physician.  You cannot drive while taking narcotics.  No lifting or carrying greater than 10 lbs. until further directed by your surgeon. Avoid periods of inactivity such as sitting longer than an hour when not asleep. This helps prevent blood clots.  You may return to work once you are authorized by your doctor.     WEIGHT BEARING   Weight bearing as tolerated with assist device (walker, cane, etc) as directed, use it as long as suggested by your surgeon or therapist, typically at least 4-6 weeks.   EXERCISES  Results after joint replacement surgery are often greatly improved when you follow the exercise, range of motion and muscle strengthening exercises prescribed by your doctor. Safety measures are also important to protect the joint from further injury. Any time any of these exercises cause you to have increased pain or swelling, decrease what you are doing until you are comfortable again and then slowly increase them. If you have problems or questions, call your caregiver or physical therapist for advice.   Rehabilitation is important following a joint replacement. After just a few days of immobilization, the muscles of the leg can become weakened and shrink (atrophy).  These exercises are designed to build up the  tone and strength of the thigh and leg muscles and to improve motion. Often times heat used for twenty to thirty minutes before working out will loosen up your tissues and help with improving the range of motion but do not use heat for the first two weeks following surgery (sometimes heat can increase post-operative swelling).   These exercises can be done on a training (exercise) mat, on the floor, on a table or on a bed. Use whatever works the best and is most comfortable for you.    Use music or  television while you are exercising so that the exercises are a pleasant break in your day. This will make your life better with the exercises acting as a break in your routine that you can look forward to.   Perform all exercises about fifteen times, three times per day or as directed.  You should exercise both the operative leg and the other leg as well.  Exercises include:   Quad Sets - Tighten up the muscle on the front of the thigh (Quad) and hold for 5-10 seconds.   Straight Leg Raises - With your knee straight (if you were given a brace, keep it on), lift the leg to 60 degrees, hold for 3 seconds, and slowly lower the leg.  Perform this exercise against resistance later as your leg gets stronger.  Leg Slides: Lying on your back, slowly slide your foot toward your buttocks, bending your knee up off the floor (only go as far as is comfortable). Then slowly slide your foot back down until your leg is flat on the floor again.  Angel Wings: Lying on your back spread your legs to the side as far apart as you can without causing discomfort.  Hamstring Strength:  Lying on your back, push your heel against the floor with your leg straight by tightening up the muscles of your buttocks.  Repeat, but this time bend your knee to a comfortable angle, and push your heel against the floor.  You may put a pillow under the heel to make it more comfortable if necessary.   A rehabilitation program following joint replacement surgery can speed recovery and prevent re-injury in the future due to weakened muscles. Contact your doctor or a physical therapist for more information on knee rehabilitation.    CONSTIPATION  Constipation is defined medically as fewer than three stools per week and severe constipation as less than one stool per week.  Even if you have a regular bowel pattern at home, your normal regimen is likely to be disrupted due to multiple reasons following surgery.  Combination of anesthesia,  postoperative narcotics, change in appetite and fluid intake all can affect your bowels.   YOU MUST use at least one of the following options; they are listed in order of increasing strength to get the job done.  They are all available over the counter, and you may need to use some, POSSIBLY even all of these options:    Drink plenty of fluids (prune juice may be helpful) and high fiber foods Colace 100 mg by mouth twice a day  Senokot for constipation as directed and as needed Dulcolax (bisacodyl), take with full glass of water  Miralax (polyethylene glycol) once or twice a day as needed.  If you have tried all these things and are unable to have a bowel movement in the first 3-4 days after surgery call either your surgeon or your primary doctor.    If you experience loose  stools or diarrhea, hold the medications until you stool forms back up.  If your symptoms do not get better within 1 week or if they get worse, check with your doctor.  If you experience "the worst abdominal pain ever" or develop nausea or vomiting, please contact the office immediately for further recommendations for treatment.   ITCHING:  If you experience itching with your medications, try taking only a single pain pill, or even half a pain pill at a time.  You can also use Benadryl over the counter for itching or also to help with sleep.   TED HOSE STOCKINGS:  Use stockings on both legs until for at least 2 weeks or as directed by physician office. They may be removed at night for sleeping.  MEDICATIONS:  See your medication summary on the "After Visit Summary" that nursing will review with you.  You may have some home medications which will be placed on hold until you complete the course of blood thinner medication.  It is important for you to complete the blood thinner medication as prescribed.  PRECAUTIONS:  If you experience chest pain or shortness of breath - call 911 immediately for transfer to the hospital emergency  department.   If you develop a fever greater that 101 F, purulent drainage from wound, increased redness or drainage from wound, foul odor from the wound/dressing, or calf pain - CONTACT YOUR SURGEON.                                                   FOLLOW-UP APPOINTMENTS:  If you do not already have a post-op appointment, please call the office for an appointment to be seen by your surgeon.  Guidelines for how soon to be seen are listed in your "After Visit Summary", but are typically between 1-4 weeks after surgery.  OTHER INSTRUCTIONS:   Knee Replacement:  Do not place pillow under knee, focus on keeping the knee straight while resting. CPM instructions: 0-90 degrees, 2 hours in the morning, 2 hours in the afternoon, and 2 hours in the evening. Place foam block, curve side up under heel at all times except when in CPM or when walking.  DO NOT modify, tear, cut, or change the foam block in any way.  POST-OPERATIVE OPIOID TAPER INSTRUCTIONS: It is important to wean off of your opioid medication as soon as possible. If you do not need pain medication after your surgery it is ok to stop day one. Opioids include: Codeine, Hydrocodone(Norco, Vicodin), Oxycodone(Percocet, oxycontin) and hydromorphone amongst others.  Long term and even short term use of opiods can cause: Increased pain response Dependence Constipation Depression Respiratory depression And more.  Withdrawal symptoms can include Flu like symptoms Nausea, vomiting And more Techniques to manage these symptoms Hydrate well Eat regular healthy meals Stay active Use relaxation techniques(deep breathing, meditating, yoga) Do Not substitute Alcohol to help with tapering If you have been on opioids for less than two weeks and do not have pain than it is ok to stop all together.  Plan to wean off of opioids This plan should start within one week post op of your joint replacement. Maintain the same interval or time between taking  each dose and first decrease the dose.  Cut the total daily intake of opioids by one tablet each day Next start to increase the  time between doses. The last dose that should be eliminated is the evening dose.   MAKE SURE YOU:  Understand these instructions.  Get help right away if you are not doing well or get worse.    Thank you for letting us be a part of your medical care team.  It is a privilege we respect greatly.  We hope these instructions will help you stay on track for a fast and full recovery!

## 2023-08-18 NOTE — Transfer of Care (Signed)
Immediate Anesthesia Transfer of Care Note  Patient: Jenny Krueger  Procedure(s) Performed: TOTAL KNEE ARTHROPLASTY (Left: Knee)  Patient Location: PACU  Anesthesia Type:GA combined with regional for post-op pain  Level of Consciousness: awake, alert , and oriented  Airway & Oxygen Therapy: Patient Spontanous Breathing and Patient connected to face mask oxygen  Post-op Assessment: Report given to RN and Post -op Vital signs reviewed and stable  Post vital signs: Reviewed and stable  Last Vitals:  Vitals Value Taken Time  BP 123/58 08/18/23 1338  Temp    Pulse 88 08/18/23 1340  Resp 15 08/18/23 1340  SpO2 99 % 08/18/23 1340  Vitals shown include unfiled device data.  Last Pain:  Vitals:   08/18/23 0855  TempSrc: Oral         Complications: No notable events documented.

## 2023-08-18 NOTE — Progress Notes (Signed)
Orthopedic Tech Progress Note Patient Details:  Jenny Krueger 07-Mar-1946 [id number] Applied CPM per order. Provided pt with bone foam.  CPM Left Knee CPM Left Knee: On Left Knee Flexion (Degrees): 90 Left Knee Extension (Degrees): 0  Post Interventions Patient Tolerated: Well Instructions Provided: Adjustment of device, Care of device Ortho Devices Type of Ortho Device: CPM padding, Bone foam zero knee Ortho Device/Splint Location: LLE Ortho Device/Splint Interventions: Ordered, Application, Adjustment   Post Interventions Patient Tolerated: Well Instructions Provided: Adjustment of device, Care of device  [name] 08/18/2023, 4:18 PM

## 2023-08-18 NOTE — Anesthesia Procedure Notes (Signed)
Procedure Name: LMA Insertion Date/Time: 08/18/2023 11:54 AM  Performed by: Elyn Peers, CRNAPre-anesthesia Checklist: Patient identified, Emergency Drugs available, Suction available, Patient being monitored and Timeout performed Patient Re-evaluated:Patient Re-evaluated prior to induction Oxygen Delivery Method: Circle system utilized Preoxygenation: Pre-oxygenation with 100% oxygen Induction Type: IV induction LMA: LMA inserted LMA Size: 4.0 Number of attempts: 1 Placement Confirmation: positive ETCO2 and breath sounds checked- equal and bilateral Tube secured with: Tape Dental Injury: Teeth and Oropharynx as per pre-operative assessment

## 2023-08-18 NOTE — Anesthesia Procedure Notes (Signed)
Anesthesia Regional Block: Adductor canal block   Pre-Anesthetic Checklist: , timeout performed,  Correct Patient, Correct Site, Correct Laterality,  Correct Procedure, Correct Position, site marked,  Risks and benefits discussed,  Surgical consent,  Pre-op evaluation,  At surgeon's request and post-op pain management  Laterality: Lower and Left  Prep: chloraprep       Needles:  Injection technique: Single-shot  Needle Type: Echogenic Needle     Needle Length: 9cm  Needle Gauge: 22     Additional Needles:   Procedures:,,,, ultrasound used (permanent image in chart),,    Narrative:  Start time: 08/18/2023 10:08 AM End time: 08/18/2023 10:14 AM Injection made incrementally with aspirations every 5 mL.  Performed by: Personally  Anesthesiologist: Trevor Iha, MD  Additional Notes: Block assessed prior to surgery. Pt tolerated procedure well.

## 2023-08-18 NOTE — Anesthesia Procedure Notes (Signed)
Procedure Name: MAC Date/Time: 08/18/2023 11:28 AM  Performed by: Elyn Peers, CRNAPre-anesthesia Checklist: Patient identified, Emergency Drugs available, Suction available, Patient being monitored and Timeout performed Oxygen Delivery Method: Simple face mask Placement Confirmation: positive ETCO2

## 2023-08-18 NOTE — Brief Op Note (Signed)
08/18/2023  1:18 PM  PATIENT:  Jenny Krueger  77 y.o. female  PRE-OPERATIVE DIAGNOSIS:  Left knee osteoarthritis, end stage  POST-OPERATIVE DIAGNOSIS:  Left knee osteoarthritis, end stage  PROCEDURE:  Procedure(s) with comments: TOTAL KNEE ARTHROPLASTY (Left) - 120 DePuy Delta Attune  SURGEON:  Surgeons and Role:    Beverely Low, MD - Primary  PHYSICIAN ASSISTANT:   ASSISTANTS: Thea Gist, PA-C   ANESTHESIA:   regional, spinal, and general  EBL:  125 mL   BLOOD ADMINISTERED:none  DRAINS: none   LOCAL MEDICATIONS USED:  MARCAINE     SPECIMEN:  No Specimen  DISPOSITION OF SPECIMEN:  N/A  COUNTS:  YES  TOURNIQUET:  * Missing tourniquet times found for documented tourniquets in log: [id number] * Total Tourniquet Time Documented: Thigh (Left) - 9 minutes Total: Thigh (Left) - 9 minutes   DICTATION: .Other Dictation: Dictation Number [id number]  PLAN OF CARE: Admit for overnight observation  PATIENT DISPOSITION:  PACU - hemodynamically stable.   Delay start of Pharmacological VTE agent (>24hrs) due to surgical blood loss or risk of bleeding: no

## 2023-08-18 NOTE — Op Note (Signed)
Jenny Krueger, Jenny Krueger MEDICAL RECORD NO: [id number] ACCOUNT NO: [id number] DATE OF BIRTH: 1946-02-17 FACILITY: [facility] LOCATION: [facility] PHYSICIAN: Almedia Balls. Ranell Patrick, MD  Operative Report   DATE OF PROCEDURE: 08/18/2023  PREOPERATIVE DIAGNOSIS:  Left knee end-stage arthritis.  POSTOPERATIVE DIAGNOSIS:  Left knee end-stage arthritis.  PROCEDURE PERFORMED:  Left total knee arthroplasty using DePuy Attune prosthesis.  ATTENDING SURGEON:  Almedia Balls. Ranell Patrick, MD  ASSISTANT:  Konrad Felix Dixon, [place], who was scrubbed during the entire procedure, and necessary for satisfactory completion of surgery.  ANESTHESIA:  Spinal anesthesia plus adductor canal block plus general was used.  ESTIMATED BLOOD LOSS:  Minimal.  FLUID REPLACEMENT:  1000 mL crystalloid.  COUNTS:  Instrument counts correct.  COMPLICATIONS:  No complications.  ANTIBIOTICS:  Perioperative antibiotics were given.  TOURNIQUET TIME:  80 minutes at 325 mmHg.  INDICATIONS:  The patient is a 77 year old female who presents with worsening left knee pain due to end-stage osteoarthritis. The patient is status post right total knee replacement.  She has done well with that surgery.  She presents now for her left  knee with bone-on-bone arthritis and progressive pain and functional loss.  Informed consent obtained.  DESCRIPTION OF PROCEDURE:  After an adequate level of spinal anesthesia was achieved, plus adductor canal block we elevated the leg, exsanguinated with an Esmarch bandage.  The patient was positioned supine on the operating table.  Nonsterile tourniquet  placed in left proximal thigh.  Left leg sterilely prepped and draped in the usual manner.  Timeout called, verifying correct patient, correct site.  We exsanguinated the leg with an Esmarch bandage, inflating the tourniquet to 325 mmHg.  We placed the  knee in flexion and performed a longitudinal midline incision.  The patient reacted indicating an incomplete spinal.  We  then initiated general anesthesia via laryngeal mask.  Once the patient was fully sedated we continued with the midline incision with  a 10 blade.  Dissection down through subcutaneous tissues.  A fresh 10 blade scalpel was used for parapatellar arthrotomy.  We divided lateral patellofemoral ligaments everting the patella and exposing the distal femur, which was devoid of cartilage.   We entered the distal femur with a step cut drill.  We then placed our intramedullary guide and resected 9 mm off the distal femur set on 5 degrees of valgus.  We then went ahead and sized our femur to a size 7 anterior down performing anterior,  posterior and chamfer cuts with the 4-in-1 block.  Next, we removed ACL and PCL and meniscal tissue subluxing the tibia anteriorly.  We then performed our tibial cut using the external jig 90 degrees perpendicular to the long axis of the tibia with  minimal posterior slope.  We did 2 mm resection off the affected medial side.  Once we did that cut with the oscillating saw we checked our gaps, which were symmetric.  We then placed a lamina spreader and removed posterior femoral condyle osteophytes  and the posterior capsule.  We injected the posterior capsule with a combination of Marcaine, Exparel and saline.  We went ahead and completed our tibial preparation with the modular drill and keel punch for the 7 tibia.  We then did a box cut for the 7  left tibia.  Once we impacted the trial femur in place, we drilled the lug holes and then we placed a 6 mm then an 8 mm poly trial and then reduced the knee.  We were able to get full extension  with good flexion stability.  We then did our patellar  resurfacing going from a 25 mm thickness down to 15 mm thickness with the oscillating saw.  We drilled lug holes for the 38 patellar button.  We then ranged the knee and had excellent patellar tracking with no-touch technique.  At this point, we removed  all trial components, irrigated thoroughly,  dried the bone well.  We vacuum mixed high viscosity cement and cemented the components into place all in one step including 7 tibia, 7 left femur.  We used the 8 mm poly trial and placed the knee in extension  to compress the cement while the cement set up.  We also used a patellar clamp on the patella while the cement was setting up.  Once all cement was hardened on the back table, removed excess cement with quarter-inch curved osteotome did an inspection  throughout the entire knee joint.  We selected the real size 7 10 mm poly and placed on the tibial tray and reduced the knee, had nice little pop as that medial condyle reduced had excellent stability both in flexion and extension.  We were able to get  full extension.  We irrigated again.  We injected the anterior capsule with Marcaine, Exparel and saline that was remaining.  We then closed the parapatellar arthrotomy with #1 Vicryl suture, followed by 0 in 2 layered subcutaneous closure and 4-0  Monocryl for skin.  Steri-Strips applied followed by sterile dressing.  The patient tolerated surgery well.   PUS D: 08/18/2023 1:26:32 pm T: 08/18/2023 1:54:00 pm  JOB: [id number]/ [id number]

## 2023-08-18 NOTE — Anesthesia Postprocedure Evaluation (Signed)
Anesthesia Post Note  Patient: Jenny Krueger  Procedure(s) Performed: TOTAL KNEE ARTHROPLASTY (Left: Knee)     Patient location during evaluation: PACU Anesthesia Type: Regional and General Level of consciousness: awake and alert Pain management: pain level controlled Vital Signs Assessment: post-procedure vital signs reviewed and stable Respiratory status: spontaneous breathing, nonlabored ventilation, respiratory function stable and patient connected to nasal cannula oxygen Cardiovascular status: blood pressure returned to baseline and stable Postop Assessment: no apparent nausea or vomiting Anesthetic complications: no   No notable events documented.  Last Vitals:  Vitals:   08/18/23 1445 08/18/23 1504  BP: (!) 121/103 130/64  Pulse: 72 71  Resp: 13 17  Temp:  36.7 C  SpO2: 99% 95%    Last Pain:  Vitals:   08/18/23 1504  TempSrc: Oral  PainSc: 3                  Trevor Iha

## 2023-08-18 NOTE — Interval H&P Note (Signed)
History and Physical Interval Note:  08/18/2023 9:23 AM  Jenny Krueger  has presented today for surgery, with the diagnosis of Left knee osteoarthritis.  The various methods of treatment have been discussed with the patient and family. After consideration of risks, benefits and other options for treatment, the patient has consented to  Procedure(s) with comments: TOTAL KNEE ARTHROPLASTY (Left) - 120 as a surgical intervention.  The patient's history has been reviewed, patient examined, no change in status, stable for surgery.  I have reviewed the patient's chart and labs.  Questions were answered to the patient's satisfaction.     [name]

## 2023-08-18 NOTE — TOC Transition Note (Signed)
Transition of Care [facility]) - CM/SW Discharge Note   Patient Details  Name: Jenny Krueger MRN: [id number] Date of Birth: 1946-02-07  Transition of Care [facility]) CM/SW Contact:  Amada Jupiter, LCSW Phone Number: 08/18/2023, 3:38 PM   Clinical Narrative:     Met with pt who confirms she has needed DME in the  home.  Notes OPPT already arranged with [facility].  No TOC needs.  Final next level of care: OP Rehab Barriers to Discharge: No Barriers Identified   Patient Goals and CMS Choice      Discharge Placement                         Discharge Plan and Services Additional resources added to the After Visit Summary for                  DME Arranged: N/A DME Agency: NA                  Social Determinants of Health (SDOH) Interventions SDOH Screenings   Food Insecurity: No Food Insecurity (04/07/2023)  Housing: Low Risk  (04/07/2023)  Transportation Needs: No Transportation Needs (04/07/2023)  Utilities: Not At Risk (04/07/2023)  Financial Resource Strain: Low Risk  (02/21/2023)   Received from [facility], [facility]  Physical Activity: Unknown (02/21/2023)   Received from [facility], [facility]  Social Connections: Socially Integrated (02/21/2023)   Received from [facility], [facility]  Stress: No Stress Concern Present (02/21/2023)   Received from [facility], [facility]  Tobacco Use: Medium Risk (08/18/2023)     Readmission Risk Interventions     No data to display

## 2023-08-18 NOTE — Anesthesia Procedure Notes (Signed)
Spinal  Patient location during procedure: OR Start time: 08/18/2023 11:30 AM End time: 08/18/2023 11:34 AM Reason for block: surgical anesthesia Staffing Performed: resident/CRNA  Resident/CRNA: Elyn Peers, CRNA Performed by: Elyn Peers, CRNA Authorized by: Trevor Iha, MD   Preanesthetic Checklist Completed: patient identified, IV checked, site marked, risks and benefits discussed, surgical consent, monitors and equipment checked, pre-op evaluation and timeout performed Spinal Block Patient position: sitting Prep: DuraPrep and site prepped and draped Patient monitoring: heart rate, continuous pulse ox, blood pressure and cardiac monitor Approach: midline Location: L3-4 Injection technique: single-shot Needle Needle type: Pencan  Needle gauge: 24 G Needle length: 10 cm Assessment Events: CSF return Additional Notes Expiration date on kit noted and within range.  Sterile prep and drape.    Local with Lidocaine 1%.  Good CSF flow noted with no heme or c/o paresthesia.   Patient tolerated well.

## 2023-08-19 DIAGNOSIS — M1712 Unilateral primary osteoarthritis, left knee: Secondary | ICD-10-CM | POA: Diagnosis not present

## 2023-08-19 LAB — BASIC METABOLIC PANEL
Anion gap: 8 (ref 5–15)
BUN: 15 mg/dL (ref 8–23)
CO2: 23 mmol/L (ref 22–32)
Calcium: 8.8 mg/dL — ABNORMAL LOW (ref 8.9–10.3)
Chloride: 107 mmol/L (ref 98–111)
Creatinine, Ser: 0.59 mg/dL (ref 0.44–1.00)
GFR, Estimated: >60 mL/min (ref >60)
Glucose, Bld: 140 mg/dL — ABNORMAL HIGH (ref 70–99)
Potassium: 4.1 mmol/L (ref 3.5–5.1)
Sodium: 138 mmol/L (ref 135–145)

## 2023-08-19 LAB — CBC
HCT: 31.7 % — ABNORMAL LOW (ref 36.0–46.0)
Hemoglobin: 10.4 g/dL — ABNORMAL LOW (ref 12.0–15.0)
MCH: 30.3 pg (ref 26.0–34.0)
MCHC: 32.8 g/dL (ref 30.0–36.0)
MCV: 92.4 fL (ref 80.0–100.0)
Platelets: 207 10*3/uL (ref 150–400)
RBC: 3.43 MIL/uL — ABNORMAL LOW (ref 3.87–5.11)
RDW: 13.9 % (ref 11.5–15.5)
WBC: 8.3 10*3/uL (ref 4.0–10.5)
nRBC: 0 % (ref 0.0–0.2)

## 2023-08-19 NOTE — Progress Notes (Signed)
Discharge meds delivered to this RN - (aspirin 81 mg tab, methocarbamol 500mg  tablets, & oxycodone 5mg  tablets). After speaking w/ primary  nurse & patient- discharge to home has been moved to Sunday or Monday. Meds sent to inpatient pharmacy.

## 2023-08-19 NOTE — Progress Notes (Signed)
Subjective: 1 Day Post-Op Procedure(s) (LRB): TOTAL KNEE ARTHROPLASTY (Left) Patient reports pain as 4 on 0-10 scale.   Denies CP or SOB.  Voiding without difficulty. Positive flatus. Objective: Vital signs in last 24 hours: Temp:  [97.5 F (36.4 C)-98.4 F (36.9 C)] 97.8 F (36.6 C) (10/12 0116) Pulse Rate:  [62-96] 62 (10/12 0116) Resp:  [13-18] 17 (10/12 0116) BP: (121-189)/(54-103) 132/90 (10/12 0116) SpO2:  [95 %-100 %] 97 % (10/12 0116) Weight:  [82.6 kg] 82.6 kg (10/11 0855)  Intake/Output from previous day: 10/11 0701 - 10/12 0700 In: 2525.8 [P.O.:400; I.V.:1725.8; IV Piggyback:400] Out: 2325 [Urine:2200; Blood:125] Intake/Output this shift: No intake/output data recorded.  Recent Labs    08/19/23 0326  HGB 10.4*   Recent Labs    08/19/23 0326  WBC 8.3  RBC 3.43*  HCT 31.7*  PLT 207   Recent Labs    08/19/23 0326  NA 138  K 4.1  CL 107  CO2 23  BUN 15  CREATININE 0.59  GLUCOSE 140*  CALCIUM 8.8*   No results for input(s): "LABPT", "INR" in the last 72 hours.  Neurologically intact Sensation intact distally Intact pulses distally  Assessment/Plan:  1 Day Post-Op Procedure(s) (LRB): TOTAL KNEE ARTHROPLASTY (Left) {[id number]}   Principal Problem:   H/O total knee replacement, left      Javier Docker 08/19/2023, @[facility] 

## 2023-08-19 NOTE — Progress Notes (Signed)
Orthopedic Tech Progress Note Patient Details:  Jenny Krueger July 31, 1946 [id number]  CPM Left Knee CPM Left Knee: On Left Knee Flexion (Degrees): 0 Left Knee Extension (Degrees): 90 Additional Comments: begin 1600  Post Interventions Patient Tolerated: Fair Instructions Provided: Adjustment of device, Care of device  Tonye Pearson 08/19/2023, 3:57 PM

## 2023-08-19 NOTE — Progress Notes (Signed)
Physical Therapy Treatment Patient Details Name: Jenny Krueger MRN: [id number] DOB: Oct 14, 1946 Today's Date: 08/19/2023   History of Present Illness 77 yo female presents to therapy s/p L TKA on 08/18/2023 due to failure of conservative measures. 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    PT Comments  Pt  declined OOB. Agreeable to ROM/exercises which she tol well. Knee AAROM grossly 10 to 65 degrees, pt asking for ice pack or "something" under knee; reinforced importance of terminal knee ext; ortho tech to place pt in CPM after session.   If plan is discharge home, recommend the following: A little help with walking and/or transfers;A little help with bathing/dressing/bathroom;Assistance with cooking/housework;Help with stairs or ramp for entrance;Assist for transportation   Can travel by private vehicle        Equipment Recommendations  None recommended by PT    Recommendations for Other Services       Precautions / Restrictions Precautions Precautions: Knee;Fall Required Braces or Orthoses: Knee Immobilizer - Left Restrictions Weight Bearing Restrictions: No     Mobility  Bed Mobility Overal bed mobility: Needs Assistance Bed Mobility: Supine to Sit     Supine to sit: Used rails, Supervision     General bed mobility comments:  (pt declines OOB/amb)    Transfers Overall transfer level: Needs assistance Equipment used: Rolling walker (2 wheels) Transfers: Sit to/from Stand Sit to Stand: Contact guard assist           General transfer comment: cues for hand placement    Ambulation/Gait Ambulation/Gait assistance: Contact guard assist Gait Distance (Feet): 50 Feet Assistive device: Rolling walker (2 wheels) Gait Pattern/deviations: Step-to pattern, Antalgic, Trunk flexed Gait velocity: decreased     General Gait Details: min cues for safety, sequence and RW position   Stairs              Wheelchair Mobility     Tilt Bed    Modified Rankin (Stroke Patients Only)       Balance Overall balance assessment: Needs assistance, History of Falls Sitting-balance support: Feet supported Sitting balance-Leahy Scale: Good     Standing balance support: Bilateral upper extremity supported, During functional activity, Reliant on assistive device for balance Standing balance-Leahy Scale: Poor                              Cognition Arousal: Alert Behavior During Therapy: WFL for tasks assessed/performed Overall Cognitive Status: Within Functional Limits for tasks assessed                                          Exercises Total Joint Exercises Ankle Circles/Pumps: AROM, Both, 15 reps Quad Sets: AROM, Both, 5 reps Gluteal Sets: Strengthening, 5 reps, Both Heel Slides: AAROM, Left, 10 reps Hip ABduction/ADduction: AROM, Left, 10 reps Straight Leg Raises: Strengthening, AAROM, Left, 10 reps    General Comments        Pertinent Vitals/Pain Pain Assessment Pain Assessment: 0-10 Pain Score: 8  Pain Location: L knee pain Pain Descriptors / Indicators: Aching, Constant, Discomfort, Operative site guarding Pain Intervention(s): Limited activity within patient's tolerance, Monitored during session, Premedicated before session, Repositioned, Ice applied    Home Living  Prior Function            PT Goals (current goals can now be found in the care plan section) Acute Rehab PT Goals Patient Stated Goal: to be able to walk without cane PT Goal Formulation: With patient Time For Goal Achievement: 09/01/23 Potential to Achieve Goals: Good Progress towards PT goals: Progressing toward goals    Frequency    7X/week      PT Plan      Co-evaluation              AM-PAC PT "6 Clicks" Mobility   Outcome Measure  Help needed turning from your back to your side while in a flat bed without  using bedrails?: A Little Help needed moving from lying on your back to sitting on the side of a flat bed without using bedrails?: A Little Help needed moving to and from a bed to a chair (including a wheelchair)?: A Little Help needed standing up from a chair using your arms (e.g., wheelchair or bedside chair)?: A Little Help needed to walk in hospital room?: A Little Help needed climbing 3-5 steps with a railing? : A Little 6 Click Score: 18    End of Session Equipment Utilized During Treatment: Gait belt;Left knee immobilizer Activity Tolerance: Patient tolerated treatment well Patient left: with call bell/phone within reach;in bed;with bed alarm set Nurse Communication: Mobility status PT Visit Diagnosis: Unsteadiness on feet (R26.81);Other abnormalities of gait and mobility (R26.89);Muscle weakness (generalized) (M62.81);History of falling (Z91.81);Pain;Difficulty in walking, not elsewhere classified (R26.2) Pain - Right/Left: Left Pain - part of body: Knee;Leg     Time: 9147-8295 PT Time Calculation (min) (ACUTE ONLY): 18 min  Charges:    $Therapeutic Exercise: 8-22 mins PT General Charges $$ ACUTE PT VISIT: 1 Visit                     Treacy Holcomb, PT  Acute Rehab Dept [facility]) [phone]  08/19/2023    [facility] 08/19/2023, 3:36 PM

## 2023-08-19 NOTE — Progress Notes (Signed)
Orthopedic Tech Progress Note Patient Details:  Jenny Krueger 10/04/1946 [id number]  CPM Left Knee CPM Left Knee: Off Left Knee Flexion (Degrees): 0 Left Knee Extension (Degrees): 90 Additional Comments: Remove CPM 1700  Post Interventions Patient Tolerated: Fair Instructions Provided: Adjustment of device, Care of device  Tonye Pearson 08/19/2023, 5:21 PM

## 2023-08-19 NOTE — Plan of Care (Signed)
Problem: Education: Goal: Knowledge of the prescribed therapeutic regimen will improve Outcome: Progressing   Problem: Pain Management: Goal: Pain level will decrease with appropriate interventions Outcome: Progressing   Problem: Nutrition: Goal: Adequate nutrition will be maintained Outcome: Progressing   Problem: Pain Managment: Goal: General experience of comfort will improve Outcome: Progressing

## 2023-08-19 NOTE — Progress Notes (Signed)
Physical Therapy Treatment Patient Details Name: Jenny Krueger MRN: [id number] DOB: 1946/08/02 Today's Date: 08/19/2023   History of Present Illness 77 yo female presents to therapy s/p L TKA on 08/18/2023 due to failure of conservative measures. 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    PT Comments  Pt progressing well, continues to report incr pain with mobility but is mobilizing at supervision to CGA level. Pt does not feel she is ready to d/c.  Will continue PT in acute setting   If plan is discharge home, recommend the following: A little help with walking and/or transfers;A little help with bathing/dressing/bathroom;Assistance with cooking/housework;Help with stairs or ramp for entrance;Assist for transportation   Can travel by private vehicle        Equipment Recommendations  None recommended by PT    Recommendations for Other Services       Precautions / Restrictions Precautions Precautions: Knee;Fall Required Braces or Orthoses: Knee Immobilizer - Left Restrictions Weight Bearing Restrictions: No     Mobility  Bed Mobility Overal bed mobility: Needs Assistance Bed Mobility: Supine to Sit     Supine to sit: Used rails, Supervision     General bed mobility comments: min cues to self assist    Transfers Overall transfer level: Needs assistance Equipment used: Rolling walker (2 wheels) Transfers: Sit to/from Stand Sit to Stand: Contact guard assist           General transfer comment: cues for hand placement    Ambulation/Gait Ambulation/Gait assistance: Contact guard assist Gait Distance (Feet): 50 Feet Assistive device: Rolling walker (2 wheels) Gait Pattern/deviations: Step-to pattern, Antalgic, Trunk flexed Gait velocity: decreased     General Gait Details: min cues for safety, sequence and RW position   Stairs             Wheelchair Mobility     Tilt Bed     Modified Rankin (Stroke Patients Only)       Balance Overall balance assessment: Needs assistance, History of Falls Sitting-balance support: Feet supported Sitting balance-Leahy Scale: Good     Standing balance support: Bilateral upper extremity supported, During functional activity, Reliant on assistive device for balance Standing balance-Leahy Scale: Poor                              Cognition Arousal: Alert Behavior During Therapy: WFL for tasks assessed/performed Overall Cognitive Status: Within Functional Limits for tasks assessed                                          Exercises Total Joint Exercises Ankle Circles/Pumps: AROM, Both, 15 reps Quad Sets: AROM, Both, 5 reps    General Comments        Pertinent Vitals/Pain Pain Assessment Pain Assessment: 0-10 Pain Score: 8  Pain Location: L knee pain Pain Descriptors / Indicators: Aching, Constant, Discomfort, Operative site guarding Pain Intervention(s): Limited activity within patient's tolerance, Monitored during session, Premedicated before session, Repositioned, Ice applied    Home Living                          Prior Function            PT Goals (current goals can now be found in the care  plan section) Acute Rehab PT Goals Patient Stated Goal: to be able to walk without cane PT Goal Formulation: With patient Time For Goal Achievement: 09/01/23 Potential to Achieve Goals: Good Progress towards PT goals: Progressing toward goals    Frequency    7X/week      PT Plan      Co-evaluation              AM-PAC PT "6 Clicks" Mobility   Outcome Measure  Help needed turning from your back to your side while in a flat bed without using bedrails?: A Little Help needed moving from lying on your back to sitting on the side of a flat bed without using bedrails?: A Little Help needed moving to and from a bed to a chair (including a wheelchair)?: A  Little Help needed standing up from a chair using your arms (e.g., wheelchair or bedside chair)?: A Little Help needed to walk in hospital room?: A Little Help needed climbing 3-5 steps with a railing? : A Lot 6 Click Score: 17    End of Session Equipment Utilized During Treatment: Gait belt;Left knee immobilizer Activity Tolerance: Patient tolerated treatment well Patient left: in chair;with call bell/phone within reach;with chair alarm set Nurse Communication: Mobility status PT Visit Diagnosis: Unsteadiness on feet (R26.81);Other abnormalities of gait and mobility (R26.89);Muscle weakness (generalized) (M62.81);History of falling (Z91.81);Pain;Difficulty in walking, not elsewhere classified (R26.2) Pain - Right/Left: Left Pain - part of body: Knee;Leg     Time: 1050-1103 PT Time Calculation (min) (ACUTE ONLY): 13 min  Charges:    $Gait Training: 8-22 mins PT General Charges $$ ACUTE PT VISIT: 1 Visit                     Mason Burleigh, PT  Acute Rehab Dept [facility]) [phone]  08/19/2023    [facility] 08/19/2023, 11:35 AM

## 2023-08-19 NOTE — Progress Notes (Signed)
Subjective: 1 Day Post-Op Procedure(s) (LRB): TOTAL KNEE ARTHROPLASTY (Left) Patient seen in rounds today for Dr. Ranell Patrick Patient reports pain as 8 on 0-10 scale.  Pain meds help Denies any N/T, N/V, CP or SOB Able to void with no issues She does tell me that PT yesterday was very difficult   Objective: Vital signs in last 24 hours: Temp:  [97.5 F (36.4 C)-98.4 F (36.9 C)] 97.8 F (36.6 C) (10/12 0116) Pulse Rate:  [62-96] 62 (10/12 0116) Resp:  [13-18] 17 (10/12 0116) BP: (121-189)/(54-103) 132/90 (10/12 0116) SpO2:  [95 %-100 %] 97 % (10/12 0116) Weight:  [82.6 kg] 82.6 kg (10/11 0855)  Intake/Output from previous day: 10/11 0701 - 10/12 0700 In: 2525.8 [P.O.:400; I.V.:1725.8; IV Piggyback:400] Out: 2325 [Urine:2200; Blood:125] Intake/Output this shift: No intake/output data recorded.  Recent Labs    08/19/23 0326  HGB 10.4*   Recent Labs    08/19/23 0326  WBC 8.3  RBC 3.43*  HCT 31.7*  PLT 207   Recent Labs    08/19/23 0326  NA 138  K 4.1  CL 107  CO2 23  BUN 15  CREATININE 0.59  GLUCOSE 140*  CALCIUM 8.8*   No results for input(s): "LABPT", "INR" in the last 72 hours.  Neurologically intact Neurovascular intact Sensation intact distally Intact pulses distally Dorsiflexion/Plantar flexion intact Incision: dressing C/D/I and Aquacel dressing placed today Compartment soft   Assessment/Plan: 1 Day Post-Op Procedure(s) (LRB): TOTAL KNEE ARTHROPLASTY (Left) Advance diet Up with therapy Plan for discharge tomorrow She is going to work with PT today and depending on how that goes hopeful discharge home tomorrow.    Patient's anticipated LOS is less than 2 midnights, meeting these requirements: - Younger than 44 - Lives within 1 hour of care - Has a competent adult at home to recover with post-op recover - NO history of  - Chronic pain requiring opiods  - Diabetes  - Coronary Artery Disease  - Heart failure  - Heart attack  - Stroke  -  DVT/VTE  - Cardiac arrhythmia  - Respiratory Failure/COPD  - Renal failure  - Anemia  - Advanced Liver disease     Cherie Dark, PA-C [facility] 08/19/2023, 8:51 AM

## 2023-08-20 DIAGNOSIS — M1712 Unilateral primary osteoarthritis, left knee: Secondary | ICD-10-CM | POA: Diagnosis not present

## 2023-08-20 LAB — CBC
HCT: 31.2 % — ABNORMAL LOW (ref 36.0–46.0)
Hemoglobin: 10.3 g/dL — ABNORMAL LOW (ref 12.0–15.0)
MCH: 30.2 pg (ref 26.0–34.0)
MCHC: 33 g/dL (ref 30.0–36.0)
MCV: 91.5 fL (ref 80.0–100.0)
Platelets: 176 10*3/uL (ref 150–400)
RBC: 3.41 MIL/uL — ABNORMAL LOW (ref 3.87–5.11)
RDW: 14.1 % (ref 11.5–15.5)
WBC: 5.6 10*3/uL (ref 4.0–10.5)
nRBC: 0 % (ref 0.0–0.2)

## 2023-08-20 NOTE — Progress Notes (Signed)
Physical Therapy Treatment Patient Details Name: Jenny Krueger MRN: [id number] DOB: 09-May-1946 Today's Date: 08/20/2023   History of Present Illness 77 yo female presents to therapy s/p L TKA on 08/18/2023 due to failure of conservative measures. 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    PT Comments  Pt continues mobilizing with CGA to supervision however limited by pain this pm. Pt reports her niece will be here around noon tomorrow; will continue to follow in acute setting, pt should be ready to d/c from PT standpoint in am.    If plan is discharge home, recommend the following: A little help with walking and/or transfers;A little help with bathing/dressing/bathroom;Assistance with cooking/housework;Help with stairs or ramp for entrance;Assist for transportation   Can travel by private vehicle        Equipment Recommendations  None recommended by PT    Recommendations for Other Services       Precautions / Restrictions Precautions Precautions: Knee;Fall Required Braces or Orthoses: Knee Immobilizer - Left Restrictions Weight Bearing Restrictions: No     Mobility  Bed Mobility Overal bed mobility: Needs Assistance Bed Mobility: Sit to Supine     Supine to sit: Supervision Sit to supine: Min assist   General bed mobility comments: min cues to self assist, assist iwth LLE d/t pain    Transfers Overall transfer level: Needs assistance Equipment used: Rolling walker (2 wheels) Transfers: Sit to/from Stand, Bed to chair/wheelchair/BSC Sit to Stand: Contact guard assist   Step pivot transfers: Contact guard assist, Supervision       General transfer comment: cues for hand placement and RW position    Ambulation/Gait Ambulation/Gait assistance: Contact guard assist, Supervision Gait Distance (Feet): 35 Feet Assistive device: Rolling walker (2 wheels) Gait Pattern/deviations: Step-to pattern,  Antalgic, Trunk flexed       General Gait Details: min cues for safety, sequence and RW position; decr wt shift to LLE d/tpain   Stairs             Wheelchair Mobility     Tilt Bed    Modified Rankin (Stroke Patients Only)       Balance Overall balance assessment: Needs assistance, History of Falls Sitting-balance support: Feet supported Sitting balance-Leahy Scale: Good     Standing balance support: Bilateral upper extremity supported, During functional activity, Reliant on assistive device for balance Standing balance-Leahy Scale: Poor                              Cognition Arousal: Alert Behavior During Therapy: WFL for tasks assessed/performed Overall Cognitive Status: Within Functional Limits for tasks assessed                                          Exercises Total Joint Exercises Ankle Circles/Pumps: AROM, Both, 15 reps Quad Sets: Limitations Quad Sets Limitations: pt deferred d/t pain    General Comments        Pertinent Vitals/Pain Pain Assessment Pain Assessment: 0-10 Pain Score: 8  Pain Location: L knee pain Pain Descriptors / Indicators: Aching, Discomfort, Operative site guarding Pain Intervention(s): Limited activity within patient's tolerance, Monitored during session, Repositioned, Ice applied, RN gave pain meds during session    Home Living  Prior Function            PT Goals (current goals can now be found in the care plan section) Acute Rehab PT Goals Patient Stated Goal: to be able to walk without cane PT Goal Formulation: With patient Time For Goal Achievement: 09/01/23 Potential to Achieve Goals: Good Progress towards PT goals: Progressing toward goals    Frequency    7X/week      PT Plan      Co-evaluation              AM-PAC PT "6 Clicks" Mobility   Outcome Measure  Help needed turning from your back to your side while in a flat bed  without using bedrails?: A Little Help needed moving from lying on your back to sitting on the side of a flat bed without using bedrails?: A Little Help needed moving to and from a bed to a chair (including a wheelchair)?: A Little Help needed standing up from a chair using your arms (e.g., wheelchair or bedside chair)?: A Little Help needed to walk in hospital room?: A Little Help needed climbing 3-5 steps with a railing? : A Lot 6 Click Score: 17    End of Session Equipment Utilized During Treatment: Gait belt;Left knee immobilizer Activity Tolerance: Patient tolerated treatment well Patient left: with call bell/phone within reach;in bed;with bed alarm set Nurse Communication: Mobility status PT Visit Diagnosis: Unsteadiness on feet (R26.81);Other abnormalities of gait and mobility (R26.89);Muscle weakness (generalized) (M62.81);History of falling (Z91.81);Pain;Difficulty in walking, not elsewhere classified (R26.2) Pain - Right/Left: Left Pain - part of body: Knee;Leg     Time: 6045-4098 PT Time Calculation (min) (ACUTE ONLY): 21 min  Charges:    $Gait Training: 8-22 mins PT General Charges $$ ACUTE PT VISIT: 1 Visit                     Kamika Goodloe, PT  Acute Rehab Dept ([facility]) [phone]  08/20/2023    [facility] 08/20/2023, 2:27 PM

## 2023-08-20 NOTE — Plan of Care (Signed)
Problem: Pain Management: Goal: Pain level will decrease with appropriate interventions Outcome: Progressing   Problem: Nutrition: Goal: Adequate nutrition will be maintained Outcome: Progressing

## 2023-08-20 NOTE — Progress Notes (Signed)
Physical Therapy Treatment Patient Details Name: Jenny Krueger MRN: [id number] DOB: 12-Mar-1946 Today's Date: 08/20/2023   History of Present Illness 77 yo female presents to therapy s/p L TKA on 08/18/2023 due to failure of conservative measures. 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    PT Comments  Pt progressing toward goals, incr gait distance; overall supervision for CGA for mobility. Son present end of session. Pt is nearing readiness for d/c from PT standpoint, she does not have any steps to enter her home; reports niece is coming from [place] to assist her, pt reports son is unable.    If plan is discharge home, recommend the following: A little help with walking and/or transfers;A little help with bathing/dressing/bathroom;Assistance with cooking/housework;Help with stairs or ramp for entrance;Assist for transportation   Can travel by private vehicle        Equipment Recommendations  None recommended by PT    Recommendations for Other Services       Precautions / Restrictions Precautions Precautions: Knee;Fall Required Braces or Orthoses: Knee Immobilizer - Left Restrictions Weight Bearing Restrictions: No     Mobility  Bed Mobility Overal bed mobility: Needs Assistance Bed Mobility: Supine to Sit     Supine to sit: Supervision     General bed mobility comments: min cues to self assist    Transfers Overall transfer level: Needs assistance Equipment used: Rolling walker (2 wheels) Transfers: Sit to/from Stand, Bed to chair/wheelchair/BSC Sit to Stand: Contact guard assist   Step pivot transfers: Contact guard assist, Supervision       General transfer comment: cues for hand placement and RW position    Ambulation/Gait Ambulation/Gait assistance: Contact guard assist, Supervision Gait Distance (Feet): 40 Feet Assistive device: Rolling walker (2 wheels) Gait Pattern/deviations:  Step-to pattern, Antalgic, Trunk flexed       General Gait Details: min cues for safety, sequence and RW position; improved wt shift to LLE   Stairs             Wheelchair Mobility     Tilt Bed    Modified Rankin (Stroke Patients Only)       Balance Overall balance assessment: Needs assistance, History of Falls Sitting-balance support: Feet supported Sitting balance-Leahy Scale: Good     Standing balance support: Bilateral upper extremity supported, During functional activity, Reliant on assistive device for balance Standing balance-Leahy Scale: Poor                              Cognition Arousal: Alert Behavior During Therapy: WFL for tasks assessed/performed Overall Cognitive Status: Within Functional Limits for tasks assessed                                          Exercises Total Joint Exercises Ankle Circles/Pumps: AROM, Both, 15 reps    General Comments        Pertinent Vitals/Pain Pain Assessment Pain Assessment: 0-10 Pain Score: 5  Pain Location: L knee pain Pain Descriptors / Indicators: Aching, Discomfort, Operative site guarding Pain Intervention(s): Limited activity within patient's tolerance, Monitored during session, Premedicated before session, Repositioned    Home Living  Prior Function            PT Goals (current goals can now be found in the care plan section) Acute Rehab PT Goals Patient Stated Goal: to be able to walk without cane PT Goal Formulation: With patient Time For Goal Achievement: 09/01/23 Potential to Achieve Goals: Good Progress towards PT goals: Progressing toward goals    Frequency    7X/week      PT Plan      Co-evaluation              AM-PAC PT "6 Clicks" Mobility   Outcome Measure  Help needed turning from your back to your side while in a flat bed without using bedrails?: A Little Help needed moving from lying on your  back to sitting on the side of a flat bed without using bedrails?: A Little Help needed moving to and from a bed to a chair (including a wheelchair)?: A Little Help needed standing up from a chair using your arms (e.g., wheelchair or bedside chair)?: A Little Help needed to walk in hospital room?: A Little Help needed climbing 3-5 steps with a railing? : A Lot 6 Click Score: 17    End of Session Equipment Utilized During Treatment: Gait belt;Left knee immobilizer Activity Tolerance: Patient tolerated treatment well Patient left: in chair;with call bell/phone within reach;with chair alarm set;with family/visitor present Nurse Communication: Mobility status PT Visit Diagnosis: Unsteadiness on feet (R26.81);Other abnormalities of gait and mobility (R26.89);Muscle weakness (generalized) (M62.81);History of falling (Z91.81);Pain;Difficulty in walking, not elsewhere classified (R26.2) Pain - Right/Left: Left Pain - part of body: Knee;Leg     Time: 0102-7253 PT Time Calculation (min) (ACUTE ONLY): 17 min  Charges:    $Gait Training: 8-22 mins PT General Charges $$ ACUTE PT VISIT: 1 Visit                     Cheyeanne Roadcap, PT  Acute Rehab Dept [facility]) [phone]  08/20/2023    [facility] [place] 08/20/2023, 12:01 PM

## 2023-08-20 NOTE — Progress Notes (Signed)
   Subjective: 2 Days Post-Op Procedure(s) (LRB): TOTAL KNEE ARTHROPLASTY (Left) Patient reports pain as mild.   Patient seen in rounds for Dr. Ranell Patrick. Patient is well, and has had no acute complaints or problems. No acute events overnight. Unfortunately her discharge plan was to go home with her son, who was admitted to the hospital for his own medical issues. She is now having her niece fly in to take care of her, who will not be here until tomorrow.  We will continue therapy today.   Objective: Vital signs in last 24 hours: Temp:  [97.5 F (36.4 C)-99.6 F (37.6 C)] 97.5 F (36.4 C) (10/13 0605) Pulse Rate:  [86-101] 89 (10/13 0605) Resp:  [16-18] 17 (10/13 0605) BP: (136-182)/(66-83) 136/66 (10/13 0605) SpO2:  [94 %-100 %] 94 % (10/13 0605)  Intake/Output from previous day:  Intake/Output Summary (Last 24 hours) at 08/20/2023 0958 Last data filed at 08/20/2023 0604 Gross per 24 hour  Intake 290 ml  Output 1875 ml  Net -1585 ml     Intake/Output this shift: No intake/output data recorded.  Labs: Recent Labs    08/19/23 0326 08/20/23 0317  HGB 10.4* 10.3*   Recent Labs    08/19/23 0326 08/20/23 0317  WBC 8.3 5.6  RBC 3.43* 3.41*  HCT 31.7* 31.2*  PLT 207 176   Recent Labs    08/19/23 0326  NA 138  K 4.1  CL 107  CO2 23  BUN 15  CREATININE 0.59  GLUCOSE 140*  CALCIUM 8.8*   No results for input(s): "LABPT", "INR" in the last 72 hours.  Exam: General - Patient is Alert and Oriented Extremity - Neurologically intact Sensation intact distally Intact pulses distally Dorsiflexion/Plantar flexion intact Dressing - dressing C/D/I Motor Function - intact, moving foot and toes well on exam.   Past Medical History:  Diagnosis Date   Arthritis    History of kidney stones    Hyperlipidemia    Hypertension    Hypothyroid    Meningitis 04/2022    Assessment/Plan: 2 Days Post-Op Procedure(s) (LRB): TOTAL KNEE ARTHROPLASTY (Left) Principal  Problem:   H/O total knee replacement, left  Estimated body mass index is 30.29 kg/m as calculated from the following:   Height as of this encounter: 5\' 5"  (1.651 m).   Weight as of this encounter: 82.6 kg. Advance diet Up with therapy  Hgb stable at 10.3  WBAT  Plan for d/c home tomorrow with niece  Up with PT today   Rosalene Billings, PA-C Orthopedic Surgery [phone] 08/20/2023, 9:58 AM

## 2023-08-20 NOTE — Plan of Care (Signed)
Problem: Education: Goal: Knowledge of the prescribed therapeutic regimen will improve Outcome: Progressing   Problem: Activity: Goal: Ability to avoid complications of mobility impairment will improve Outcome: Progressing   Problem: Pain Management: Goal: Pain level will decrease with appropriate interventions Outcome: Progressing   Problem: Nutrition: Goal: Adequate nutrition will be maintained Outcome: Progressing

## 2023-08-21 ENCOUNTER — Encounter (HOSPITAL_COMMUNITY): Payer: Self-pay | Admitting: Orthopedic Surgery

## 2023-08-21 ENCOUNTER — Other Ambulatory Visit (HOSPITAL_COMMUNITY): Payer: Self-pay

## 2023-08-21 DIAGNOSIS — M1712 Unilateral primary osteoarthritis, left knee: Secondary | ICD-10-CM | POA: Diagnosis not present

## 2023-08-21 NOTE — Progress Notes (Signed)
Physical Therapy Treatment Patient Details Name: Jenny Krueger MRN: [id number] DOB: [birth year] Today's Date: 08/21/2023   History of Present Illness 77 yo female presents to therapy s/p L TKA on 08/18/2023 due to failure of conservative measures. 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    PT Comments  Pt is meeting goals, ready to d/c with family assist from PT standpoint. Progressing with knee ROM and gait distance/activity tol. Will benefit from continued PT in next venue    If plan is discharge home, recommend the following: A little help with walking and/or transfers;A little help with bathing/dressing/bathroom;Assistance with cooking/housework;Help with stairs or ramp for entrance;Assist for transportation   Can travel by private vehicle        Equipment Recommendations  None recommended by PT    Recommendations for Other Services       Precautions / Restrictions Precautions Precautions: Knee;Fall Restrictions Weight Bearing Restrictions: No     Mobility  Bed Mobility Overal bed mobility: Needs Assistance Bed Mobility: Supine to Sit     Supine to sit: Supervision     General bed mobility comments: min cues to self assist, instructed/ongoing education using gait belt as leg lifter    Transfers Overall transfer level: Needs assistance Equipment used: Rolling walker (2 wheels) Transfers: Sit to/from Stand, Bed to chair/wheelchair/BSC Sit to Stand: Supervision   Step pivot transfers: Contact guard assist, Supervision       General transfer comment: cues for hand placement initially,pt doe sdemo carryover from previous session    Ambulation/Gait Ambulation/Gait assistance: Contact guard assist, Supervision Gait Distance (Feet): 60 Feet Assistive device: Rolling walker (2 wheels) Gait Pattern/deviations: Step-to pattern, Antalgic, Trunk flexed, Step-through pattern       General Gait  Details: min cues for safety, sequence and RW position; improved wt shift to LLE, beginning step through end of distance   Stairs             Wheelchair Mobility     Tilt Bed    Modified Rankin (Stroke Patients Only)       Balance Overall balance assessment: Needs assistance, History of Falls Sitting-balance support: Feet supported Sitting balance-Leahy Scale: Good     Standing balance support: Bilateral upper extremity supported, During functional activity, Reliant on assistive device for balance Standing balance-Leahy Scale: Poor                              Cognition Arousal: Alert Behavior During Therapy: WFL for tasks assessed/performed Overall Cognitive Status: Within Functional Limits for tasks assessed                                          Exercises Total Joint Exercises Ankle Circles/Pumps: AROM, Both, 15 reps Quad Sets: Limitations Heel Slides: AAROM, Left, 10 reps Straight Leg Raises: Strengthening, AAROM, Left, 10 reps    General Comments        Pertinent Vitals/Pain Pain Assessment Pain Assessment: 0-10 Pain Score: 5  Pain Location: L knee pain Pain Descriptors / Indicators: Aching, Discomfort, Operative site guarding Pain Intervention(s): Limited activity within patient's tolerance, Monitored during session, Premedicated before session, Repositioned    Home Living  Prior Function            PT Goals (current goals can now be found in the care plan section) Acute Rehab PT Goals Patient Stated Goal: to be able to walk without cane PT Goal Formulation: With patient Time For Goal Achievement: 09/01/23 Potential to Achieve Goals: Good Progress towards PT goals: Progressing toward goals    Frequency    7X/week      PT Plan      Co-evaluation              AM-PAC PT "6 Clicks" Mobility   Outcome Measure  Help needed turning from your back to your side  while in a flat bed without using bedrails?: A Little Help needed moving from lying on your back to sitting on the side of a flat bed without using bedrails?: A Little Help needed moving to and from a bed to a chair (including a wheelchair)?: A Little Help needed standing up from a chair using your arms (e.g., wheelchair or bedside chair)?: A Little Help needed to walk in hospital room?: A Little Help needed climbing 3-5 steps with a railing? : A Little 6 Click Score: 18    End of Session Equipment Utilized During Treatment: Gait belt Activity Tolerance: Patient tolerated treatment well Patient left: with call bell/phone within reach;in chair;with chair alarm set Nurse Communication: Mobility status PT Visit Diagnosis: Unsteadiness on feet (R26.81);Other abnormalities of gait and mobility (R26.89);Muscle weakness (generalized) (M62.81);History of falling (Z91.81);Pain;Difficulty in walking, not elsewhere classified (R26.2) Pain - Right/Left: Left Pain - part of body: Knee;Leg     Time: 7829-5621 PT Time Calculation (min) (ACUTE ONLY): 24 min  Charges:    $Gait Training: 8-22 mins $Therapeutic Exercise: 8-22 mins PT General Charges $$ ACUTE PT VISIT: 1 Visit                     Azahel Belcastro, PT  Acute Rehab Dept [facility]) [phone]  08/21/2023    [facility] 08/21/2023, 10:22 AM

## 2023-08-21 NOTE — Care Management Important Message (Signed)
Important Message  Patient Details IM Letter given. Name: Jenny Krueger MRN: [id number] Date of Birth: 12-23-1945   Important Message Given:  Yes - Medicare IM     Caren Macadam 08/21/2023, 12:03 PM

## 2023-08-21 NOTE — Progress Notes (Signed)
   Subjective: 3 Days Post-Op Procedure(s) (LRB): TOTAL KNEE ARTHROPLASTY (Left)  Pt c/o moderate pain/soreness in the knee Denies any new symptoms overnight Therapy has been difficult Patient reports pain as moderate.  Objective:   VITALS:   Vitals:   08/20/23 2022 08/21/23 0454  BP: (!) 158/68 (!) 142/54  Pulse: 94 88  Resp: 19 18  Temp: 99.3 F (37.4 C) 98.9 F (37.2 C)  SpO2: 94% 91%    Left knee incision healing well Nvi intact distally No rashes or edema distally  LABS Recent Labs    08/19/23 0326 08/20/23 0317  HGB 10.4* 10.3*  HCT 31.7* 31.2*  WBC 8.3 5.6  PLT 207 176    Recent Labs    08/19/23 0326  NA 138  K 4.1  BUN 15  CREATININE 0.59  GLUCOSE 140*     Assessment/Plan: 3 Days Post-Op Procedure(s) (LRB): TOTAL KNEE ARTHROPLASTY (Left) Plan to d/c home today after therapy Pain management  F/u in 2 weeks in the office    Brad Nael Petrosyan PA-C, MPAS [facility] is now [facility]  [place] [address], [address], [place], [place] [address] Phone: [phone] [email or web address] Facebook  [employer]

## 2023-08-21 NOTE — Plan of Care (Signed)
  Problem: Education: Goal: Knowledge of the prescribed therapeutic regimen will improve Outcome: Adequate for Discharge Goal: Individualized Educational Video(s) Outcome: Adequate for Discharge   Problem: Activity: Goal: Ability to avoid complications of mobility impairment will improve Outcome: Adequate for Discharge Goal: Range of joint motion will improve Outcome: Adequate for Discharge   Problem: Clinical Measurements: Goal: Postoperative complications will be avoided or minimized Outcome: Adequate for Discharge   Problem: Pain Management: Goal: Pain level will decrease with appropriate interventions Outcome: Adequate for Discharge   Problem: Skin Integrity: Goal: Will show signs of wound healing Outcome: Adequate for Discharge   Problem: Education: Goal: Knowledge of General Education information will improve Description: Including pain rating scale, medication(s)/side effects and non-pharmacologic comfort measures Outcome: Adequate for Discharge   Problem: Health Behavior/Discharge Planning: Goal: Ability to manage health-related needs will improve Outcome: Adequate for Discharge   Problem: Clinical Measurements: Goal: Ability to maintain clinical measurements within normal limits will improve Outcome: Adequate for Discharge Goal: Will remain free from infection Outcome: Adequate for Discharge Goal: Diagnostic test results will improve Outcome: Adequate for Discharge Goal: Respiratory complications will improve Outcome: Adequate for Discharge Goal: Cardiovascular complication will be avoided Outcome: Adequate for Discharge   Problem: Activity: Goal: Risk for activity intolerance will decrease Outcome: Adequate for Discharge   Problem: Nutrition: Goal: Adequate nutrition will be maintained Outcome: Adequate for Discharge   Problem: Coping: Goal: Level of anxiety will decrease Outcome: Adequate for Discharge   Problem: Elimination: Goal: Will not  experience complications related to bowel motility Outcome: Adequate for Discharge Goal: Will not experience complications related to urinary retention Outcome: Adequate for Discharge   Problem: Pain Managment: Goal: General experience of comfort will improve Outcome: Adequate for Discharge

## 2023-08-21 NOTE — Discharge Summary (Signed)
In most cases prophylactic antibiotics for Dental procdeures after total joint surgery are not necessary.  Exceptions are as follows:  1. History of prior total joint infection  2. Severely immunocompromised (Organ Transplant, cancer chemotherapy, Rheumatoid biologic meds such as Humera)  3. Poorly controlled diabetes (A1C &gt; 8.0, blood glucose over 200)  If you have one of these conditions, contact your surgeon for an antibiotic prescription, prior to your dental procedure. Orthopedic Discharge Summary        Physician Discharge Summary  Patient ID: Jenny Krueger MRN: [id number] DOB/AGE: [birth year] 77 y.o.  Admit date: 08/18/2023 Discharge date: 08/21/2023   Procedures:  Procedure(s) (LRB): TOTAL KNEE ARTHROPLASTY (Left)  Attending Physician:  Dr. Malon Kindle  Admission Diagnoses:   left knee end stage osteoarthritis  Discharge Diagnoses:  left knee end stage osteoarthritis   Past Medical History:  Diagnosis Date   Arthritis    History of kidney stones    Hyperlipidemia    Hypertension    Hypothyroid    Meningitis 04/2022    PCP: Ladora Daniel, PA-C   Discharged Condition: good  Hospital Course:  Patient underwent the above stated procedure on 08/18/2023. Patient tolerated the procedure well and brought to the recovery room in good condition and subsequently to the floor. Patient had an uncomplicated hospital course and was stable for discharge.   Disposition: Discharge disposition: 01-Home or Self Care      with follow up in 2 weeks    Follow-up Information     Beverely Low, MD. Call in 2 week(s).   Specialty: Orthopedic Surgery Why: call [phone] for appt in two weeks in the office Contact information: [address] [place] [address] [phone]                 Dental Antibiotics:  In most cases prophylactic antibiotics for Dental procdeures after total joint surgery are not  necessary.  Exceptions are as follows:  1. History of prior total joint infection  2. Severely immunocompromised (Organ Transplant, cancer chemotherapy, Rheumatoid biologic meds such as Humera)  3. Poorly controlled diabetes (A1C &gt; 8.0, blood glucose over 200)  If you have one of these conditions, contact your surgeon for an antibiotic prescription, prior to your dental procedure.  Discharge Instructions     Call MD / Call 911   Complete by: As directed    If you experience chest pain or shortness of breath, CALL 911 and be transported to the hospital emergency room.  If you develope a fever above 101 F, pus (white drainage) or increased drainage or redness at the wound, or calf pain, call your surgeon's office.   Constipation Prevention   Complete by: As directed    Drink plenty of fluids.  Prune juice may be helpful.  You may use a stool softener, such as Colace (over the counter) 100 mg twice a day.  Use MiraLax (over the counter) for constipation as needed.   Diet - low sodium heart healthy   Complete by: As directed    Increase activity slowly as tolerated   Complete by: As directed    Post-operative opioid taper instructions:   Complete by: As directed    POST-OPERATIVE OPIOID TAPER INSTRUCTIONS: It is important to wean off of your opioid medication as soon as possible. If you do not need pain medication after your surgery it is ok to stop day one. Opioids include: Codeine, Hydrocodone(Norco, Vicodin), Oxycodone(Percocet, oxycontin) and hydromorphone amongst others.  Long  term and even short term use of opiods can cause: Increased pain response Dependence Constipation Depression Respiratory depression And more.  Withdrawal symptoms can include Flu like symptoms Nausea, vomiting And more Techniques to manage these symptoms Hydrate well Eat regular healthy meals Stay active Use relaxation techniques(deep breathing, meditating, yoga) Do Not substitute Alcohol to  help with tapering If you have been on opioids for less than two weeks and do not have pain than it is ok to stop all together.  Plan to wean off of opioids This plan should start within one week post op of your joint replacement. Maintain the same interval or time between taking each dose and first decrease the dose.  Cut the total daily intake of opioids by one tablet each day Next start to increase the time between doses. The last dose that should be eliminated is the evening dose.          Allergies as of 08/21/2023       Reactions   Cefepime Rash   Ceftriaxone Rash   Losartan Hives, Shortness Of Breath, Rash   Hydrocortisone Rash, Other (See Comments)   Bad rash   Penicillins Rash   Latex Other (See Comments)   "I turned red and developed sores in my mouth"   Lisinopril Cough   Takes PTA 04/07/23   Shrimp Extract Other (See Comments)   "Bumps in my mouth developed."   Tramadol Other (See Comments)   "DID NOT AGREE WITH ME"   Vancomycin Other (See Comments)   Thinks she had a rash but not sure. Tolerated 04/07/23   Codeine Nausea Only   Nickel Rash, Other (See Comments)   Skin breaks out if exposed   Other Rash, Other (See Comments)   CANNOT USE ANYTHING OTHER THAN 14K GOLD   Oxycodone Nausea Only        Medication List     STOP taking these medications    hydrocortisone cream 1 %   NON FORMULARY   NON FORMULARY   OVER THE COUNTER MEDICATION       TAKE these medications    amLODipine 10 MG tablet Commonly known as: NORVASC Take 1 tablet (10 mg total) by mouth daily.   Aspirin Low Dose 81 MG chewable tablet Generic drug: aspirin Chew 1 tablet (81 mg total) by mouth 2 (two) times daily.   azelastine 0.1 % nasal spray Commonly known as: ASTELIN Place 1 spray into both nostrils 2 (two) times daily as needed for allergies.   benzonatate 200 MG capsule Commonly known as: TESSALON Take 200 mg by mouth 2 (two) times daily as needed for cough.    cetirizine 10 MG tablet Commonly known as: ZYRTEC Take 10 mg by mouth daily.   COLLAGEN PO Take 1 Scoop by mouth daily.   levofloxacin 500 MG tablet Commonly known as: LEVAQUIN Take 1 tablet (500 mg total) by mouth daily.   MELATONIN PO Take 1 tablet by mouth at bedtime.   methocarbamol 500 MG tablet Commonly known as: ROBAXIN Take 1 tablet (500 mg total) by mouth every 8 (eight) hours as needed for muscle spasms.   olmesartan 40 MG tablet Commonly known as: BENICAR Take 40 mg by mouth daily.   ondansetron 4 MG tablet Commonly known as: ZOFRAN Take 1 tablet (4 mg total) by mouth every 6 (six) hours as needed for nausea.   ondansetron 4 MG tablet Commonly known as: Zofran Take 1 tablet (4 mg total) by mouth every 8 (eight) hours  as needed for nausea, vomiting or refractory nausea / vomiting.   oxyCODONE 5 MG immediate release tablet Commonly known as: Oxy IR/ROXICODONE Take 1 tablet (5 mg total) by mouth every 4 (four) hours as needed for moderate pain, severe pain or breakthrough pain. What changed: when to take this   polyethylene glycol 17 g packet Commonly known as: MIRALAX / GLYCOLAX Take 17 g by mouth daily as needed for mild constipation.   Probiotic Acidophilus Caps Take 1 capsule by mouth daily.   triamcinolone cream 0.1 % Commonly known as: KENALOG Apply 1 Application topically 2 (two) times daily as needed (to itchy or irritated areas).   Vitamin D3 10 MCG (400 UNIT) Caps Take 400 Units by mouth daily.          Signed: Thea Gist 08/21/2023, 9:32 AM  [facility] is now [employer] [address], [place], [place] [address] Phone: [phone] Facebook  Instagram  [employer]
